# Patient Record
Sex: Female | Born: 1955 | Race: White | Hispanic: No | Marital: Married | Smoking: Former smoker
Health system: Southern US, Community
[De-identification: ages and names within clinical notes are randomized; demographics above are authoritative.]

## PROBLEM LIST (undated history)

## (undated) DIAGNOSIS — Z720 Tobacco use: Secondary | ICD-10-CM

## (undated) DIAGNOSIS — Z888 Allergy status to other drugs, medicaments and biological substances status: Secondary | ICD-10-CM

## (undated) DIAGNOSIS — I509 Heart failure, unspecified: Secondary | ICD-10-CM

## (undated) DIAGNOSIS — I209 Angina pectoris, unspecified: Secondary | ICD-10-CM

## (undated) DIAGNOSIS — R0600 Dyspnea, unspecified: Secondary | ICD-10-CM

## (undated) DIAGNOSIS — E785 Hyperlipidemia, unspecified: Secondary | ICD-10-CM

## (undated) DIAGNOSIS — J449 Chronic obstructive pulmonary disease, unspecified: Secondary | ICD-10-CM

## (undated) HISTORY — DX: Allergy status to other drugs, medicaments and biological substances: Z88.8

## (undated) HISTORY — PX: KNEE ARTHROSCOPY: SUR90

## (undated) HISTORY — DX: Tobacco use: Z72.0

## (undated) HISTORY — DX: Morbid (severe) obesity due to excess calories: E66.01

## (undated) HISTORY — DX: Heart failure, unspecified: I50.9

## (undated) HISTORY — DX: Hyperlipidemia, unspecified: E78.5

## (undated) HISTORY — PX: TUBAL LIGATION: SHX77

---

## 1999-11-15 ENCOUNTER — Other Ambulatory Visit: Admission: RE | Admit: 1999-11-15 | Discharge: 1999-11-15 | Payer: Self-pay | Admitting: Family Medicine

## 2000-02-17 ENCOUNTER — Encounter: Payer: Self-pay | Admitting: Family Medicine

## 2000-02-17 ENCOUNTER — Ambulatory Visit (HOSPITAL_COMMUNITY): Admission: RE | Admit: 2000-02-17 | Discharge: 2000-02-17 | Payer: Self-pay | Admitting: Family Medicine

## 2003-02-17 ENCOUNTER — Ambulatory Visit (HOSPITAL_COMMUNITY): Admission: RE | Admit: 2003-02-17 | Discharge: 2003-02-17 | Payer: Self-pay | Admitting: Family Medicine

## 2003-04-28 ENCOUNTER — Other Ambulatory Visit: Admission: RE | Admit: 2003-04-28 | Discharge: 2003-04-28 | Payer: Self-pay | Admitting: Family Medicine

## 2003-05-06 ENCOUNTER — Ambulatory Visit: Admission: RE | Admit: 2003-05-06 | Discharge: 2003-05-06 | Payer: Self-pay | Admitting: Family Medicine

## 2005-12-08 ENCOUNTER — Other Ambulatory Visit: Admission: RE | Admit: 2005-12-08 | Discharge: 2005-12-08 | Payer: Self-pay | Admitting: Obstetrics and Gynecology

## 2006-09-11 ENCOUNTER — Encounter: Admission: RE | Admit: 2006-09-11 | Discharge: 2006-09-11 | Payer: Self-pay | Admitting: Orthopedic Surgery

## 2006-09-14 ENCOUNTER — Ambulatory Visit (HOSPITAL_BASED_OUTPATIENT_CLINIC_OR_DEPARTMENT_OTHER): Admission: RE | Admit: 2006-09-14 | Discharge: 2006-09-14 | Payer: Self-pay | Admitting: Orthopedic Surgery

## 2009-05-05 ENCOUNTER — Other Ambulatory Visit: Admission: RE | Admit: 2009-05-05 | Discharge: 2009-05-05 | Payer: Self-pay | Admitting: Family Medicine

## 2010-05-06 ENCOUNTER — Ambulatory Visit
Admission: RE | Admit: 2010-05-06 | Discharge: 2010-05-06 | Payer: Self-pay | Source: Home / Self Care | Attending: Internal Medicine | Admitting: Internal Medicine

## 2010-05-06 DIAGNOSIS — F17200 Nicotine dependence, unspecified, uncomplicated: Secondary | ICD-10-CM | POA: Insufficient documentation

## 2010-05-06 DIAGNOSIS — J439 Emphysema, unspecified: Secondary | ICD-10-CM | POA: Insufficient documentation

## 2010-05-09 DIAGNOSIS — J45909 Unspecified asthma, uncomplicated: Secondary | ICD-10-CM | POA: Insufficient documentation

## 2010-05-19 NOTE — Assessment & Plan Note (Signed)
Summary: CONSULT ASTHMA//SH   Primary Provider/Referring Provider:  [name]  CC:  Pulmonary Consult-asthma;Dr [name].Kelly Hale  History of Present Illness: 2010-06-04- 54yoF referred by DR Cliffton Asters for concern of asthma. Current smoker describes onset of asthma around [age] years old. Family hx of asthma- mother and MGF were patients of mine. Hosp with asthma/ croup tent age 20- blamed then on exposure to feather pillow. . Did better through high school. Now blames dust at work for sinnus congestion, sneeze. Daily phlegm, somedays some wheeze. Sensitive to weather changes. Doesn't think of herslf as an allergic person. Prefers Advair over Milligan recently tried. 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 Additional stress a factor since husband lost job. Denies GERD.  Had pneumovax but chooses not to have flu vax.  Aspirin causes short of breath and wheeze- never dx'd with nasal polyps.  Exposed to Uncle w/ TB in 1970's but her PPD was negative.   Preventive Screening-Counseling & Management  Alcohol-Tobacco     Smoking Status: current     Smoking Cessation Counseling: yes     Smoke Cessation Stage: precontemplative     Packs/Day: 1ppd     Year Started: 1988     Pack years: 30     Tobacco Counseling: to quit use of tobacco products  Current Medications (verified): 1)  Advair Diskus 250-50 Mcg/dose Aepb (Fluticasone-Salmeterol) .Kelly Hale.. 1 Puff  Two Times A Day and Rinse Mouth Well 2)  Proair Hfa 108 (90 Base) Mcg/act Aers (Albuterol Sulfate) .... 2 Puffs Four Times A Day As Needed  Allergies (verified): 1)  ! 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  Risk Factors: Smoking Status: current (04-Jun-2010) Packs/Day: 1ppd (2010-06-04)  Past Medical History: Asthma Hyperlipidemia Aspirin allergy- no polyps  Past Surgical History: C-section Tubal ligation right knee arthroscopy  Family History: Father-deceased MVA Mother-HTN,hypercholesterol, asthma, heart valve replaced Paternal Grand Father: DM Maternal Grandfather: HTN,Hypercholoestrol,CVA Maternal Grandmother: Hypertension, Alzheimers MAternal Uncle: laryngeal cancer  Social History: Smoker 1ppd x 30 years Lives with Husband:Kelly Hale- [employer] and Receiving Air cabin crew storeSmoking Status:  current Packs/Day:  1ppd Pack years:  30  Review of Systems      See HPI       The patient complains of productive cough, sore throat, nasal congestion/difficulty breathing through nose, and anxiety.  The patient denies shortness of breath with activity, shortness of breath at rest, non-productive cough, coughing up blood, chest pain, irregular heartbeats, acid heartburn, indigestion, loss of appetite, weight change, abdominal pain, difficulty swallowing, tooth/dental problems, headaches, sneezing, itching, ear ache, depression, hand/feet swelling, joint stiffness or pain, rash, change in color of mucus, and fever.    Vital Signs:  Patient profile:   55 year old female Height:      67 inches Weight:      245 pounds BMI:     38.51 O2 Sat:      92 % on Room air Pulse rate:   92 / minute BP sitting:   124 / 78  (left arm) Cuff size:   large  Vitals Entered By: Reynaldo Minium CMA (Jun 04, 2010 2:55 PM)  O2 Flow:  Room air CC: Pulmonary Consult-asthma;Dr [name].  Physical Exam  Additional Exam:  General: A/Ox3; pleasant and cooperative, NAD, overweight SKIN: no rash, lesions NODES: no lymphadenopathy HEENT: [place]/AT, EOM- WNL, Conjuctivae- clear, PERRLA,  TM-WNL, Nose- clear, Throat- clear and wnl, Mallampati  II NECK: Supple w/ fair ROM, JVD- none, normal carotid impulses w/o bruits Thyroid- normal to palpation CHEST: light bilateral wheeze, unlabored, no cough HEART: RRR, no m/g/r heard ABDOMEN: Soft and nl; nml bowel sounds; no organomegaly or masses noted ZOX:WRUE, nl pulses, no edema, cyanosis or clubbing NEURO: Grossly intact to observation      Impression & Recommendations:  Problem # 1:  BRONCHITIS (ICD-490) This may better be described as chronic bronchitis with reactive airway triggering due to cold air, dust, weather and season changes. She did apparently have a recognizable allergic component at one time, but not clear how important that is any longer. We will look for PFTdone at ?[facility] and see if there is an old GCDA chart in warehouse. She prefers ADvair over Nan[place]uli and has returned to it, with mnedication discussion done.  We will update CXR and try sample Spiriva for effect.  Her updated medication list for this problem includes:    Advair Diskus 250-50 Mcg/dose Aepb (Fluticasone-salmeterol) .Kelly Hale... 1 puff  two times a day and rinse mouth well    Proair Hfa 108 (90 Base) Mcg/act Aers (Albuterol sulfate) .Kelly Hale... 2 puffs four times a day as needed  Problem # 2:  TOBACCO USER (ICD-305.1) Extra time was spent on this topic with her. She feels stressed about husband's job and not ready to make cessation effort now. Hopefully we can get some traction going forward.   Medications Added to Medication List This Visit: 1)  Advair Diskus 250-50 Mcg/dose Aepb (Fluticasone-salmeterol) .Kelly Hale.. 1 puff  two times a day and rinse mouth well 2)  Proair Hfa 108 (90 Base) Mcg/act Aers (Albuterol sulfate) .... 2 puffs four times a day as needed  Other Orders: Est. Patient Level III (45409) T-2 View CXR (71020TC) Consultation Level III (81191)  Patient Instructions: 1)  Please schedule a follow-up appointment in 1 month. 2)  OK to continue Advair  250/50, 1 puff and rinse, twice dialy 3)  Ok to use [employer] as a rescue inhaler up to 4 ties daily as needed 4)  Try sample Spiriva 1 daily. See if it gradually seems to help wheeze or shortness of breath 5)  Please - it is time to try again getting ff those cigarettes before they do you in.  6)  A chest x-ray has been recommended.  Your imaging study may require preauthorization.  7)  .........................................................................................Kelly Hale 8)  DG CHEST 2 VIEW - [id number] 9)    10)  Clinical Data: Smoking history, history of asthma 11)    12)  CHEST - 2 VIEW 13)    14)  Comparison: None. 15)    16)  Findings: No active infiltrate or effusion is seen.  Mild 17)  peribronchial thickening is noted consistent with bronchitis. 18)  Mediastinal contours appear grossly normal.  The heart is within 19)  normal limits in size.  There is a thoracic scoliosis present with 20)  some degenerative change. 21)    22)  IMPRESSION: 23)    24)  1.  No active lung disease. 25)  2.  Peribronchial thickening consistent with bronchitis. 26)  3.  Thoracic scoliosis. 27)    28)  Read By:  Juline Patch,  M.D.     29)  Released By:  Gery Pray,  Colon Flattery,  M.D. 30)  cc Dr Cliffton Asters

## 2010-06-08 ENCOUNTER — Ambulatory Visit: Payer: Self-pay | Admitting: Internal Medicine

## 2010-08-24 NOTE — Op Note (Signed)
Kelly Hale, Kelly Hale                ACCOUNT NO.:  [id number]   MEDICAL RECORD NO.:  [id number]          PATIENT TYPE:  AMB   LOCATION:  DSC                          FACILITY:  [facility]   PHYSICIAN:  Rodney A. Mortenson, M.D.DATE OF BIRTH:  1955/10/26   DATE OF PROCEDURE:  09/14/2006  DATE OF DISCHARGE:                               OPERATIVE REPORT   JUSTIFICATION:  A 55 year old female referred here by the courtesy of  Dr. [name] regarding a 6-7-week history of right knee pain, no  injury.  She does twisting type of motions.  She has pain along the  medial joint line.  Getting out of the car is quite uncomfortable.  There is acute tenderness along the medial joint line.  An MRI shows A  tear of the posterior horn at the meniscal root.  No other significant  pathology seen in the knee, otherwise some fairly mild tricompartmental  chondromalacia.  Because of persistent pain and discomfort, it was felt  that arthroscopic evaluation was indicated and necessary.  Complications  discussed preoperatively.  Questions answered and encouraged.   JUSTIFICATION FOR OUTPATIENT SURGERY:  Minimal morbidity.   PREOPERATIVE DIAGNOSES:  1. Tear of posterior horn attachment, medial meniscus of the right      knee.   POSTOPERATIVE DIAGNOSES:  1. 1.  Tear of posterior horn attachment, medial meniscus of the right      knee.  2. 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   OPERATION:  1. Arthroscopy.  2. Debride posterior horn medial meniscus, right knee.  3. Chondroplasty of medial femoral condyle.   SURGEON:  Lenard Galloway. Chaney Malling, M.D.   ANESTHESIA:  General.   PATHOLOGY:  With the arthroscope in the knee, a very careful examination  of the knee was undertaken.  The patellofemoral joint appeared fairly  normal.  In the lateral compartment there was a normal articular  cartilage over the lateral tibial plateau and the lateral femoral  condyle.  Entire entire circumference of the lateral meniscus was  normal.  The anterior cruciate ligament was normal.  In the medial  compartment the anterior 7/8 of the medial meniscus was absolutely  normal.  However, at the posterior root attachment there was marked  fraying and tearing of the meniscus and this could be displaced with a  probe.  The femoral condyle of this area was frayed and torn and  cartilage damaged, rated at least grade 2 cartilage damage in this area.   PROCEDURE:  The patient placed on the operating table in a supine  position with a pneumatic tourniquet about the right thigh.  The right  leg was placed in a leg holder and the entire right lower extremity was  prepped with DuraPrep and draped out in the usual manner.  An infusion  cannula was placed in the superior medial pouch and the knee distended  with saline.  Anteromedial and anterolateral portals were made and a  midline portal was made for better access.  The arthroscope was  introduced.  The only significant  pathology was seen in the medial  compartment.  Through both medial and lateral and the midline portals a  series of baskets were inserted and the posterior root attachment was  debrided aggressively.  This was followed by the intra-articular shaver.  All the frayed and torn portion of cartilage was removed.  The remaining  7/8 of the meniscus appeared normal.  Using a full-radius dissector,  chondroplasty was done over the damaged area of the medial femoral  condyle.  Once this was completed to my satisfaction, the knee was  filled with Marcaine and a large bulky pressure dressing applied.  The  patient then returned to the recovery room in nice condition.  Technically this procedure went extremely well.   FOLLOW-UP CARE:  1. To my office next week.  2. Remove dressing on Saturday.  3. Percocet for pain.           ______________________________  Lenard Galloway. Chaney Malling, M.D.     RAM/MEDQ   D:  09/14/2006  T:  09/14/2006  Job:  [id number]

## 2011-03-11 ENCOUNTER — Encounter: Payer: Self-pay | Admitting: Internal Medicine

## 2011-03-14 ENCOUNTER — Encounter: Payer: Self-pay | Admitting: Internal Medicine

## 2011-03-14 ENCOUNTER — Ambulatory Visit (INDEPENDENT_AMBULATORY_CARE_PROVIDER_SITE_OTHER): Payer: 59 | Admitting: Internal Medicine

## 2011-03-14 VITALS — BP 130/82 | HR 80 | Ht 67.0 in | Wt 258.8 lb

## 2011-03-14 DIAGNOSIS — R05 Cough: Secondary | ICD-10-CM

## 2011-03-14 DIAGNOSIS — F17200 Nicotine dependence, unspecified, uncomplicated: Secondary | ICD-10-CM

## 2011-03-14 DIAGNOSIS — J40 Bronchitis, not specified as acute or chronic: Secondary | ICD-10-CM

## 2011-03-14 DIAGNOSIS — Z23 Encounter for immunization: Secondary | ICD-10-CM

## 2011-03-14 NOTE — Progress Notes (Signed)
12//3/12- [name] smoker followed for chronic bronchitis, tobacco use LOV- 05/06/10 Did well through the summer. Dr. Marvel Plan increased Advair to 500/50 without benefit. As cooler weather came in, she noticed increased wheeze, cough productive of white or trace yellow sputum. 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 She rates okay at night except that wheeze delays her sleep onset. Still smoking one pack per day and resists admitting that it is causing any problem. She says her husband is similar but doesn't smoke. She chokes easily with liquids. CXR- 05/06/10- bronchitis  ROS- see HPI Constitutional:   No-   weight loss, night sweats, fevers, chills, fatigue, lassitude. HEENT:   No-  headaches, difficulty swallowing, tooth/dental problems, sore throat,       No-  sneezing, itching, ear ache, nasal congestion, post nasal drip,  CV:  No-   chest pain, orthopnea, PND, swelling in lower extremities, anasarca,                                  dizziness, palpitations Resp: + shortness of breath with exertion             No-   productive cough,  No non-productive cough,  No- coughing up of blood.              No-   change in color of mucus.  No- wheezing.   Skin: No-   rash or lesions. GI:  No-   heartburn, indigestion, abdominal pain, nausea, vomiting, diarrhea,                 change in bowel habits, loss of appetite GU: No-   dysuria, change in color of urine, no urgency or frequency.  No- flank pain. MS:  No-   joint pain or swelling.  No- decreased range of motion.  No- back pain. Neuro-     nothing unusual Psych:  No- change in mood or affect. No depression or anxiety.  No memory loss.  Obj General- Alert, Oriented, Affect-appropriate, Distress- none acute, overweight Skin- rash-none, lesions- none, excoriation- none Lymphadenopathy- none Head- atraumatic            Eyes- Gross vision intact, PERRLA, conjunctivae clear secretions  Ears- Hearing, canals-normal            Nose- Clear, no-Septal dev, mucus, polyps, erosion, perforation             Throat- Mallampati III , mucosa red , drainage- none, tonsils- atrophic Neck- flexible , trachea midline, no stridor , thyroid nl, carotid no bruit Chest - symmetrical excursion , unlabored           Heart/CV- RRR , no murmur , no gallop  , no rub, nl s1 s2                           - JVD- none , edema- none, stasis changes- none, varices- none           Lung- Crackles, wheeze- none, cough- none , dullness-none, rub- none           Chest wall-  Abd- tender-no, distended-no, bowel sounds-present, HSM- no Br/ Gen/ Rectal- Not done, not indicated Extrem- cyanosis- none, clubbing, none, atrophy- none, strength- nl Neuro- grossly intact to observation

## 2011-03-14 NOTE — Patient Instructions (Signed)
Sample Symbicort 160/5    2 puffs and rinse mouth twice daily     Try this instead of Advair  Order- modified barium swallow with speech therapy-       Cough  Order PFT  Please stop smoking. It can't be good for your breathing and it just costs money. The nicotine patches may help;

## 2011-03-16 NOTE — Assessment & Plan Note (Addendum)
Chronic bronchitis with some exacerbation related to fall weather. Smoking cessation is obviously key. Plan- change Advair to Symbicort - Clarify whether she has had Flu or Pneumovax. -PFT -Modified barium swallow with speech therapy to evaluate choking with liquids. Recurrent aspiration would contribute to her bronchitis.

## 2011-03-16 NOTE — Assessment & Plan Note (Signed)
She was not receptive to my efforts to discuss smoking cessation opportunities.

## 2011-03-17 ENCOUNTER — Other Ambulatory Visit (HOSPITAL_COMMUNITY): Payer: Self-pay | Admitting: Internal Medicine

## 2011-03-22 ENCOUNTER — Ambulatory Visit (INDEPENDENT_AMBULATORY_CARE_PROVIDER_SITE_OTHER): Payer: 59 | Admitting: Internal Medicine

## 2011-03-22 DIAGNOSIS — R05 Cough: Secondary | ICD-10-CM

## 2011-03-22 LAB — PULMONARY FUNCTION TEST

## 2011-03-22 NOTE — Progress Notes (Signed)
PFT done today. 

## 2011-03-23 ENCOUNTER — Ambulatory Visit (HOSPITAL_COMMUNITY)
Admission: RE | Admit: 2011-03-23 | Discharge: 2011-03-23 | Disposition: A | Discharge status: Home / Self Care | Payer: 59 | Source: Ambulatory Visit | Attending: Internal Medicine | Admitting: Internal Medicine

## 2011-03-23 DIAGNOSIS — R1310 Dysphagia, unspecified: Secondary | ICD-10-CM | POA: Insufficient documentation

## 2011-03-23 DIAGNOSIS — R05 Cough: Secondary | ICD-10-CM

## 2011-03-23 NOTE — Procedures (Signed)
Celia B. Bueche, MSP, CCC-SLP [phone] 

## 2011-03-24 NOTE — Procedures (Signed)
Modified Barium Swallow Procedure Note Patient Details  Name: Kelly Hale MRN: [id number] Date of Birth: [birth year]  Today's Date: 03/24/2011   Past Medical History:  Past Medical History  Diagnosis Date  . Asthma   . Hyperlipidemia   . Aspirin allergy     non polyps   Past Surgical History:  Past Surgical History  Procedure Date  . Cesarean section   . Tubal ligation   . Knee arthroscopy     right   HPI:   Pt referred for outpatient MBS due to history of choking on liquids.  Recommendation/Prognosis   Continue regular diet with thin liquids, no straws.  Pt was given oropharyngeal exercises to complete independently, to increase strength of oropharyngeal musculature and improve swallow safety.  Pt was encouraged to maintain contact with MD regarding this issue, as outpatient dysphagia therapy may be beneficial in the future.  Additionally, pt reported intermittent pressure and pain in the esophageal area.  Pt was encouraged to begin meals with a warm beverage or soup.   Recommended continued attention to this, as esophageal workup may be needed in the future.      SLP Assessment/Plan  Pt exhibits tolerance of puree, solid, and pill without difficulty, penetration or aspiration.  Pt exhibits trace penetration of thin liquid via cup and straw, which is decreased by the use of chin tuck position.  One episode of aspiration was seen on thin liquids, which resulted in immediate and effective reflexive cough.  Pt given exercises to complete independently, but was encouraged to contact MD for referral for outpatient therapy if  this difficulty continues.  Also encouraged pt to contact MD if esophageal issues persist.  General:  Date of Onset: 03/23/11 Other Pertinent Information: No prior MBS.  Pt reports intermittent history of choking on liquids. Type of Study: Initial MBS Diet Prior to this Study: Regular;Thin liquids Temperature Spikes Noted: No Respiratory Status: Room  air History of Intubation: No Behavior/Cognition: Alert;Cooperative;Pleasant mood Oral Cavity - Dentition: Adequate natural dentition Oral Motor / Sensory Function: Within functional limits Vision: Functional for self-feeding Patient Positioning: Upright in chair Baseline Vocal Quality: Normal Volitional Cough: Strong Volitional Swallow: Able to elicit Anatomy: Within functional limits Pharyngeal Secretions: Not observed secondary MBS Ice chips: Not tested  Celia B. Bueche, MSP, CCC-SLP [phone]

## 2011-06-17 ENCOUNTER — Encounter: Payer: Self-pay | Admitting: Internal Medicine

## 2011-06-17 ENCOUNTER — Ambulatory Visit (INDEPENDENT_AMBULATORY_CARE_PROVIDER_SITE_OTHER): Payer: 59 | Admitting: Internal Medicine

## 2011-06-17 VITALS — BP 120/84 | HR 86 | Ht 67.0 in | Wt 257.6 lb

## 2011-06-17 DIAGNOSIS — M792 Neuralgia and neuritis, unspecified: Secondary | ICD-10-CM

## 2011-06-17 DIAGNOSIS — F17200 Nicotine dependence, unspecified, uncomplicated: Secondary | ICD-10-CM

## 2011-06-17 DIAGNOSIS — J40 Bronchitis, not specified as acute or chronic: Secondary | ICD-10-CM

## 2011-06-17 DIAGNOSIS — J449 Chronic obstructive pulmonary disease, unspecified: Secondary | ICD-10-CM

## 2011-06-17 DIAGNOSIS — M5412 Radiculopathy, cervical region: Secondary | ICD-10-CM

## 2011-06-17 MED ORDER — ARFORMOTEROL TARTRATE 15 MCG/2ML IN NEBU: 15.0000 ug | INHALATION_SOLUTION | Freq: Two times a day (BID) | RESPIRATORY_TRACT | Status: DC

## 2011-06-17 MED ORDER — ALBUTEROL SULFATE HFA 108 (90 BASE) MCG/ACT IN AERS: 2.0000 | INHALATION_SPRAY | Freq: Four times a day (QID) | RESPIRATORY_TRACT | Status: DC | PRN

## 2011-06-17 NOTE — Patient Instructions (Signed)
Kelly Hale now  Skip your evening dose of Advair this time, while we look at how the nebulizer treatment works for you.  You already have moderately severe COPD- It is really important that you stop smoking now ! The patches and electronic cigarettes are better than smoking cigarettes.  Ask Dr Cliffton Asters for evaluation of the right arm pain. I think you have pinched nerves in your neck.

## 2011-06-17 NOTE — Progress Notes (Signed)
12//3/12- [name] smoker followed for chronic bronchitis, tobacco use LOV- 05/06/10 Did well through the summer. Dr. Marvel Plan increased Advair to 500/50 without benefit. As cooler weather came in, she noticed increased wheeze, cough productive of white or trace yellow sputum. 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 She rates okay at night except that wheeze delays her sleep onset. Still smoking one pack per day and resists admitting that it is causing any problem. She says her husband is similar but doesn't smoke. She chokes easily with liquids. CXR- 05/06/10- bronchitis  06/17/11- [name] smoker followed for chronic bronchitis, tobacco use CXR 05/06/10 reviewed w/ her-  1. No active lung disease.  2. Peribronchial thickening consistent with bronchitis.  3. Thoracic scoliosis.  Provider: Darrelyn Hillock  She felt okay through this winter with no significant respiratory infection. Dislikes cold weather. Does not expect a pollen problems the spring. Continues Advair 500 and uses her rescue inhaler once every 3 days. Little cough or drainage usually. Pain in right arm and right side of neck hurts enough to keep her awake. 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 We had given samples Spiriva with no effect. Continues to smoke against advice. PFT: 03/22/2011-FEV1 1.49/58%, FEV1/FVC 0.62, insignificant response to bronchodilator, RV 163% indicating air trapping, DLCO 72%. Moderate obstructive airways disease without response to bronchodilator. Air-trapping.  ROS- see HPI Constitutional:   No-   weight loss, night sweats, fevers, chills, fatigue, lassitude. HEENT:   No-  headaches, difficulty swallowing, tooth/dental problems, sore throat,       No-  sneezing, itching, ear ache, nasal congestion, post nasal drip,  CV:  No-   chest pain, orthopnea, PND, swelling in lower extremities, anasarca,  dizziness, palpitations Resp: + shortness of breath with  exertion             No-   productive cough,  No non-productive cough,  No- coughing up of blood.              No-   change in color of mucus.  No- wheezing.   Skin: No-   rash or lesions. GI:  No-   heartburn, indigestion, abdominal pain, nausea, vomiting, GU: No-   dysuria,  MS: +  joint pain or swelling.  No- decreased range of motion.  No- back pain. Neuro-     nothing unusual Psych:  No- change in mood or affect. No depression or anxiety.  No memory loss.  Obj General- Alert, Oriented, Affect-appropriate, Distress- none acute, overweight Skin- rash-none, lesions- none, excoriation- none Lymphadenopathy- none Head- atraumatic            Eyes- Gross vision intact, PERRLA, conjunctivae clear secretions            Ears- Hearing, canals-normal            Nose- Clear, no-Septal dev, mucus, polyps, erosion, perforation             Throat- Mallampati III , mucosa red , drainage- none, tonsils- atrophic Neck- flexible , trachea midline, no stridor , thyroid nl, carotid no bruit Chest - symmetrical excursion , unlabored           Heart/CV- RRR , no murmur , no gallop  , no rub, nl s1 s2                           - JVD- none , edema- none,  stasis changes- none, varices- none           Lung-Diminished w/ coarse breath sounds, wheeze- none, cough- none , dullness-none, rub- none           Chest wall-  Abd-  Br/ Gen/ Rectal- Not done, not indicated Extrem- cyanosis- none, clubbing, none, atrophy- none, strength- nl Neuro- grossly intact to observation

## 2011-06-19 DIAGNOSIS — M792 Neuralgia and neuritis, unspecified: Secondary | ICD-10-CM | POA: Insufficient documentation

## 2011-06-19 NOTE — Assessment & Plan Note (Signed)
Chronic bronchitis pattern in context of moderately severe COPD with poor response to bronchodilator on PFTs. We discussed this as I reviewed the PFTs. Weight loss and smoking cessation of the most important things she could do. Plan-try Brovana nebulizer treatment here

## 2011-06-19 NOTE — Assessment & Plan Note (Signed)
I tried to engage her in discussion of smoking cessation approaches. She is not motivated.

## 2011-06-19 NOTE — Assessment & Plan Note (Signed)
She describes pain in the right side of neck and into the right arm with numbness in the thumb and first 2 fingers of the right hand. 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 Chest x-ray does not show a Pancoast tumor. She is to speak with her primary physician about it, anticipating probable referral.

## 2011-10-17 ENCOUNTER — Ambulatory Visit: Payer: 59 | Admitting: Internal Medicine

## 2012-03-02 ENCOUNTER — Other Ambulatory Visit: Payer: Self-pay | Admitting: Physician Assistant

## 2012-03-02 ENCOUNTER — Other Ambulatory Visit (HOSPITAL_COMMUNITY)
Admission: RE | Admit: 2012-03-02 | Discharge: 2012-03-02 | Disposition: A | Discharge status: Home / Self Care | Payer: 59 | Source: Ambulatory Visit | Attending: Family Medicine | Admitting: Family Medicine

## 2012-03-02 DIAGNOSIS — Z0000 Encounter for general adult medical examination without abnormal findings: Secondary | ICD-10-CM | POA: Insufficient documentation

## 2012-10-11 ENCOUNTER — Ambulatory Visit: Payer: Self-pay | Admitting: Otolaryngology

## 2012-10-18 ENCOUNTER — Ambulatory Visit: Payer: 59 | Admitting: Internal Medicine

## 2012-11-22 ENCOUNTER — Ambulatory Visit: Payer: 59 | Admitting: Internal Medicine

## 2013-08-12 ENCOUNTER — Other Ambulatory Visit: Payer: Self-pay | Admitting: Family Medicine

## 2013-08-12 ENCOUNTER — Ambulatory Visit
Admission: RE | Admit: 2013-08-12 | Discharge: 2013-08-12 | Disposition: A | Discharge status: Home / Self Care | Payer: 59 | Source: Ambulatory Visit | Attending: Family Medicine | Admitting: Family Medicine

## 2013-08-12 DIAGNOSIS — J441 Chronic obstructive pulmonary disease with (acute) exacerbation: Secondary | ICD-10-CM

## 2013-08-22 ENCOUNTER — Encounter: Payer: Self-pay | Admitting: Internal Medicine

## 2013-08-22 ENCOUNTER — Ambulatory Visit (INDEPENDENT_AMBULATORY_CARE_PROVIDER_SITE_OTHER): Payer: 59 | Admitting: Internal Medicine

## 2013-08-22 VITALS — BP 144/84 | HR 100 | Ht 67.0 in | Wt 251.1 lb

## 2013-08-22 DIAGNOSIS — J449 Chronic obstructive pulmonary disease, unspecified: Secondary | ICD-10-CM

## 2013-08-22 DIAGNOSIS — F17200 Nicotine dependence, unspecified, uncomplicated: Secondary | ICD-10-CM

## 2013-08-22 DIAGNOSIS — IMO0001 Reserved for inherently not codable concepts without codable children: Secondary | ICD-10-CM

## 2013-08-22 MED ORDER — ACLIDINIUM BROMIDE 400 MCG/ACT IN AEPB: 1.0000 | INHALATION_SPRAY | Freq: Two times a day (BID) | RESPIRATORY_TRACT | Status: DC

## 2013-08-22 NOTE — Progress Notes (Signed)
[name], MD at 06/17/2011  4:25 PM      Status: Signed            12//3/12- 50 yoF smoker followed for chronic bronchitis, tobacco use LOV- 05/06/10 Did well through the summer. Dr. Larena Glassman increased Advair to 500/50 without benefit. As cooler weather came in, she noticed increased wheeze, cough productive of white or trace yellow sputum. 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 She rates okay at night except that wheeze delays her sleep onset. Still smoking one pack per day and resists admitting that it is causing any problem. She says her husband is similar but doesn't smoke. She chokes easily with liquids. CXR- 05/06/10- bronchitis  06/17/11- [name] smoker followed for chronic bronchitis, tobacco use CXR 05/06/10 reviewed w/ her-   1. No active lung disease.   2. Peribronchial thickening consistent with bronchitis.   3. Thoracic scoliosis.   Provider: Lorina Rabon    She felt okay through this winter with no significant respiratory infection. Dislikes cold weather. Does not expect a pollen problems the spring. Continues Advair 500 and uses her rescue inhaler once every 3 days. Little cough or drainage usually. Pain in right arm and right side of neck hurts enough to keep her awake. 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 We had given samples Spiriva with no effect. Continues to smoke against advice. PFT: 03/22/2011-FEV1 1.49/58%, FEV1/FVC 0.62, insignificant response to bronchodilator, RV 163% indicating air trapping, DLCO 72%. Moderate obstructive airways disease without response to bronchodilator. Air-trapping.  Assessment:   COPD with bronchitis - [name], MD at 06/19/2011  8:45 PM      Status: Written Related Problem: BRONCHITIS    Chronic bronchitis pattern in context of moderately severe COPD with poor response to bronchodilator on PFTs. We discussed this as I reviewed the PFTs. Weight loss and smoking  cessation of the most important things she could do. Plan-try Brovana nebulizer treatment here       Neuropathic pain, arm - [name], MD at 06/19/2011  8:53 PM      Status: Written Related Problem: Neuropathic pain, arm    She describes pain in the right side of neck and into the right arm with numbness in the thumb and first 2 fingers of the right hand. 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 Chest x-ray does not show a Pancoast tumor. She is to speak with her primary physician about it, anticipating probable referral.  TOBACCO USER - [name], MD at 06/19/2011  8:53 PM      Status: Written Related Problem: TOBACCO USER    I tried to engage her in discussion of smoking cessation approaches. She is not motivated.    ----------------------------------------------------------------------- 08/22/13- 57 yoF smoker (1ppd/ 30 pk yr) with COPD/bronchitis/asthma complicated by tobacco use [name] Cough was worse for 3 weeks but now back at baseline. Denies routine daily cough, but still some green sputum. Took Levaquin for 10 days, prednisone for 10 days ending 4 days ago. Baseline dyspnea on exertion without blood, swelling, chest pain, palpitation. Using [place]. Advair 500, nebulizer albuterol, rescue inhaler. 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 CXR 08/12/13 IMPRESSION:  No acute infiltrate or pulmonary edema. Thoracic dextroscoliosis  again noted. Central mild bronchitic changes.  Electronically Signed  By: [name]  M.D.  On: 08/12/2013 13:22   ROS-see HPI Constitutional:   No-   weight loss, night sweats, fevers, chills, fatigue, lassitude. HEENT:   No-  headaches, difficulty swallowing, tooth/dental problems, sore throat,       No-  sneezing, itching, ear ache, +nasal congestion, +post nasal drip,  CV:  No-   chest pain, orthopnea, PND, swelling in lower  extremities, anasarca,                                  dizziness, palpitations Resp: +shortness of breath with exertion or at rest.              +productive cough,  No non-productive cough,  No- coughing up of blood.              +change in color of mucus.  No- wheezing.   Skin: No-   rash or lesions. GI:  No-   heartburn, indigestion, abdominal pain, nausea, vomiting,  GU:  MS:  No-   joint pain or swelling.  Neuro-     nothing unusual Psych:  No- change in mood or affect. No depression or anxiety.  No memory loss.  OBJ- Physical Exam General- Alert, Oriented, Affect-appropriate, Distress- none acute, obese Skin- rash-none, lesions- none, excoriation- none Lymphadenopathy- none Head- atraumatic            Eyes- Gross vision intact, PERRLA, conjunctivae and secretions clear            Ears- Hearing, canals-normal            Nose- Clear, no-Septal dev, mucus, polyps, erosion, perforation             Throat- Mallampati III-IV , mucosa clear , drainage- none, tonsils- atrophic Neck- flexible , trachea midline, no stridor , thyroid nl, carotid no bruit Chest - symmetrical excursion , unlabored           Heart/CV- RRR , no murmur , no gallop  , no rub, nl s1 s2                           - JVD- none , edema- none, stasis changes- none, varices- none           Lung- + mid- zone crackles, wheeze- none, cough- none , dullness-none, rub- none           Chest wall-  Abd- tender-no, distended-no, bowel sounds-present, HSM- no Br/ Gen/ Rectal- Not done, not indicated Extrem- cyanosis- none, clubbing, none, atrophy- none, strength- nl, +scoliosis Neuro- grossly intact to observation

## 2013-08-22 NOTE — Patient Instructions (Signed)
Order- Schedule PFT   Dx COPD with bronchitis  Sample x 2 Tudorza   1 puff, twice daily  Continue Advair 1 puff then rinse mouth, twice every day  Use the nebulizer or rescue albuterol inhaler up to 4 times daily, IF NEEDED  Please try to get serious about stopping those cigarettes before they stop you !

## 2013-10-01 ENCOUNTER — Ambulatory Visit (INDEPENDENT_AMBULATORY_CARE_PROVIDER_SITE_OTHER): Payer: 59 | Admitting: Internal Medicine

## 2013-10-01 DIAGNOSIS — J449 Chronic obstructive pulmonary disease, unspecified: Secondary | ICD-10-CM

## 2013-10-01 LAB — PULMONARY FUNCTION TEST
DL/VA % pred: 119 %
DL/VA: 6.04 ml/min/mmHg/L
DLCO UNC % PRED: 99 %
DLCO unc: 26.77 ml/min/mmHg
FEF 25-75 POST: 0.83 L/s
FEF 25-75 PRE: 0.97 L/s
FEF2575-%CHANGE-POST: -14 %
FEF2575-%PRED-POST: 32 %
FEF2575-%Pred-Pre: 37 %
FEV1-%Change-Post: -1 %
FEV1-%PRED-POST: 52 %
FEV1-%Pred-Pre: 53 %
FEV1-POST: 1.48 L
FEV1-PRE: 1.5 L
FEV1FVC-%CHANGE-POST: -1 %
FEV1FVC-%PRED-PRE: 86 %
FEV6-%CHANGE-POST: 3 %
FEV6-%PRED-PRE: 59 %
FEV6-%Pred-Post: 62 %
FEV6-Post: 2.18 L
FEV6-Pre: 2.1 L
FEV6FVC-%PRED-POST: 103 %
FEV6FVC-%Pred-Pre: 103 %
FVC-%CHANGE-POST: 0 %
FVC-%PRED-POST: 60 %
FVC-%Pred-Pre: 60 %
FVC-Post: 2.18 L
FVC-Pre: 2.19 L
PRE FEV1/FVC RATIO: 69 %
PRE FEV6/FVC RATIO: 100 %
Post FEV1/FVC ratio: 68 %
Post FEV6/FVC ratio: 100 %
RV % PRED: 116 %
RV: 2.37 L
TLC % pred: 92 %
TLC: 4.94 L

## 2013-10-01 NOTE — Progress Notes (Signed)
PFT done today. 

## 2013-10-06 NOTE — Assessment & Plan Note (Signed)
Counseling reinforced 

## 2013-10-06 NOTE — Assessment & Plan Note (Signed)
Recent exacerbation mostly resolved. Plan-smoking cessation, schedule PFT, continue [place]

## 2013-11-22 ENCOUNTER — Telehealth: Payer: Self-pay | Admitting: Internal Medicine

## 2013-11-22 MED ORDER — ALBUTEROL SULFATE (2.5 MG/3ML) 0.083% IN NEBU: 2.5000 mg | INHALATION_SOLUTION | Freq: Four times a day (QID) | RESPIRATORY_TRACT | Status: DC | PRN

## 2013-11-22 NOTE — Telephone Encounter (Signed)
[name] aware that Rx has been approved to send by CY and have sent it to [facility]. Nothing more needed at this time.

## 2013-11-22 NOTE — Telephone Encounter (Signed)
Pt states that CY wanted her to get a nebulizer machine and neb meds at last OV; pt states she got a neb machine from a family member and just needs the medication.   CY please advise. Thanks.

## 2013-11-22 NOTE — Telephone Encounter (Signed)
Offer albuterol 0.083% neb solution, # 100, 1 neb every 6 hours if needed, refill prn

## 2013-12-19 ENCOUNTER — Ambulatory Visit (INDEPENDENT_AMBULATORY_CARE_PROVIDER_SITE_OTHER)
Admission: RE | Admit: 2013-12-19 | Discharge: 2013-12-19 | Disposition: A | Discharge status: Home / Self Care | Payer: 59 | Source: Ambulatory Visit | Attending: Internal Medicine | Admitting: Internal Medicine

## 2013-12-19 ENCOUNTER — Ambulatory Visit (INDEPENDENT_AMBULATORY_CARE_PROVIDER_SITE_OTHER): Payer: 59 | Admitting: Internal Medicine

## 2013-12-19 ENCOUNTER — Encounter: Payer: Self-pay | Admitting: Internal Medicine

## 2013-12-19 ENCOUNTER — Other Ambulatory Visit (INDEPENDENT_AMBULATORY_CARE_PROVIDER_SITE_OTHER): Payer: 59

## 2013-12-19 VITALS — BP 132/78 | Ht 67.0 in | Wt 270.8 lb

## 2013-12-19 DIAGNOSIS — J441 Chronic obstructive pulmonary disease with (acute) exacerbation: Secondary | ICD-10-CM

## 2013-12-19 DIAGNOSIS — J449 Chronic obstructive pulmonary disease, unspecified: Secondary | ICD-10-CM

## 2013-12-19 DIAGNOSIS — IMO0001 Reserved for inherently not codable concepts without codable children: Secondary | ICD-10-CM

## 2013-12-19 DIAGNOSIS — F17200 Nicotine dependence, unspecified, uncomplicated: Secondary | ICD-10-CM

## 2013-12-19 LAB — CBC WITH DIFFERENTIAL/PLATELET
BASOS PCT: 0.9 % (ref 0.0–3.0)
Basophils Absolute: 0.1 10*3/uL (ref 0.0–0.1)
EOS PCT: 1.7 % (ref 0.0–5.0)
Eosinophils Absolute: 0.2 10*3/uL (ref 0.0–0.7)
HCT: 55.3 % — ABNORMAL HIGH (ref 36.0–46.0)
HEMOGLOBIN: 17.1 g/dL — AB (ref 12.0–15.0)
LYMPHS PCT: 16.1 % (ref 12.0–46.0)
Lymphs Abs: 1.4 10*3/uL (ref 0.7–4.0)
MCHC: 30.9 g/dL (ref 30.0–36.0)
MCV: 93.7 fl (ref 78.0–100.0)
Monocytes Absolute: 0.5 10*3/uL (ref 0.1–1.0)
Monocytes Relative: 6.1 % (ref 3.0–12.0)
NEUTROS ABS: 6.6 10*3/uL (ref 1.4–7.7)
NEUTROS PCT: 75.2 % (ref 43.0–77.0)
Platelets: 275 10*3/uL (ref 150.0–400.0)
RBC: 5.9 Mil/uL — AB (ref 3.87–5.11)
RDW: 16.8 % — ABNORMAL HIGH (ref 11.5–15.5)
WBC: 8.8 10*3/uL (ref 4.0–10.5)

## 2013-12-19 LAB — D-DIMER, QUANTITATIVE: D-Dimer, Quant: 0.38 ug/mL-FEU (ref 0.00–0.48)

## 2013-12-19 MED ORDER — AMOXICILLIN-POT CLAVULANATE 875-125 MG PO TABS: 1.0000 | ORAL_TABLET | Freq: Two times a day (BID) | ORAL | Status: DC

## 2013-12-19 MED ORDER — METHYLPREDNISOLONE ACETATE 80 MG/ML IJ SUSP
80.0000 mg | Freq: Once | INTRAMUSCULAR | Status: AC
  Administered 2013-12-19: 80 mg via INTRAMUSCULAR

## 2013-12-19 MED ORDER — LEVALBUTEROL HCL 0.63 MG/3ML IN NEBU
0.6300 mg | INHALATION_SOLUTION | Freq: Once | RESPIRATORY_TRACT | Status: AC
  Administered 2013-12-19: 0.63 mg via RESPIRATORY_TRACT

## 2013-12-19 MED ORDER — FUROSEMIDE 20 MG PO TABS: ORAL_TABLET | ORAL | Status: DC

## 2013-12-19 NOTE — Assessment & Plan Note (Signed)
Acute exacerbation, possibly viral. I think this is mostly respiratory, with question of CHF component. Doubt acute cardiac event. Check for pneumonia or PE. Plan- CXR, CBC w diff, BMET, BNP. Lasix, augmentin, neb xopenex and depomedrol here. If we can stabilize her rapidly we may prevent eed for O2

## 2013-12-19 NOTE — Progress Notes (Signed)
Kelly Lever, MD at 06/17/2011  4:25 PM      Status: Signed            12//3/12- 58 yoF smoker followed for chronic bronchitis, tobacco use LOV- 05/06/10 Did well through the summer. Dr. Larena Glassman increased Advair to 500/50 without benefit. As cooler weather came in, she noticed increased wheeze, cough productive of white or trace yellow sputum. 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 She rates okay at night except that wheeze delays her sleep onset. Still smoking one pack per day and resists admitting that it is causing any problem. She says her husband is similar but doesn't smoke. She chokes easily with liquids. CXR- 05/06/10- bronchitis  06/17/11- [name] smoker followed for chronic bronchitis, tobacco use CXR 05/06/10 reviewed w/ her-   1. No active lung disease.   2. Peribronchial thickening consistent with bronchitis.   3. Thoracic scoliosis.   Provider: Lorina Hale    She felt okay through this winter with no significant respiratory infection. Dislikes cold weather. Does not expect a pollen problems the spring. Continues Advair 500 and uses her rescue inhaler once every 3 days. Little cough or drainage usually. Pain in right arm and right side of neck hurts enough to keep her awake. 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 We had given samples Spiriva with no effect. Continues to smoke against advice. PFT: 03/22/2011-FEV1 1.49/58%, FEV1/FVC 0.62, insignificant response to bronchodilator, RV 163% indicating air trapping, DLCO 72%. Moderate obstructive airways disease without response to bronchodilator. Air-trapping.  Assessment:   COPD with bronchitis - Kelly Lever, MD at 06/19/2011  8:45 PM      Status: Written Related Problem: BRONCHITIS    Chronic bronchitis pattern in context of moderately severe COPD with poor response to bronchodilator on PFTs. We discussed this as I reviewed the PFTs. Weight loss and smoking  cessation of the most important things she could do. Plan-try Brovana nebulizer treatment here       Neuropathic pain, arm - Kelly Lever, MD at 06/19/2011  8:53 PM      Status: Written Related Problem: Neuropathic pain, arm    She describes pain in the right side of neck and into the right arm with numbness in the thumb and first 2 fingers of the right hand. 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 Chest x-ray does not show a Pancoast tumor. She is to speak with her primary physician about it, anticipating probable referral.  TOBACCO USER - Kelly Lever, MD at 06/19/2011  8:53 PM      Status: Written Related Problem: TOBACCO USER    I tried to engage her in discussion of smoking cessation approaches. She is not motivated.    ----------------------------------------------------------------------- 08/22/13- 57 yoF smoker (1ppd/ 30 pk yr) with COPD/bronchitis/asthma complicated by tobacco use Mother is Kelly Hale Cough was worse for 3 weeks but now back at baseline. Denies routine daily cough, but still some green sputum. Took Levaquin for 10 days, prednisone for 10 days ending 4 days ago. Baseline dyspnea on exertion without blood, swelling, chest pain, palpitation. Using [place]. Advair 500, nebulizer albuterol, rescue inhaler. 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 CXR 08/12/13 IMPRESSION:  No acute infiltrate or pulmonary edema. Thoracic dextroscoliosis  again noted. Central mild bronchitic changes.  Electronically Signed  By: [name]  M.D.  On: 08/12/2013 13:22   12/19/13- 71 yoF smoker (1ppd/ 30 pk yr) with COPD/bronchitis/asthma complicated by tobacco use Mother is Kelly Hale FOLLOWS FOR:sob,wheeze x 1 wk.,bilat. ankle and leg edema,cough-clear,no fcs,midchest tightness and pain She feels like asthma, coughing clear mucus, persistent midsternal soreness- not radiating or  pleuritic, feet swelling a lot.  On arrival here sat mid-70% on room air walking. 90% on O2 2l at rest  ROS-see HPI Constitutional:   No-   weight loss, night sweats, fevers, chills, fatigue, lassitude. HEENT:   No-  headaches, difficulty swallowing, tooth/dental problems, sore throat,       No-  sneezing, itching, ear ache, +nasal congestion, +post nasal drip,  CV:  N=chest pain, orthopnea, PND, +swelling in lower extremities, anasarca,                                  dizziness, palpitations Resp: +shortness of breath with exertion or at rest.              +productive cough,  No non-productive cough,  No- coughing up of blood.              +change in color of mucus.  No- wheezing.   Skin: No-   rash or lesions. GI:  No-   heartburn, indigestion, abdominal pain, nausea, vomiting,  GU:  MS:  No-   joint pain or swelling.  Neuro-     nothing unusual Psych:  No- change in mood or affect. No depression or anxiety.  No memory loss.  OBJ- Physical Exam On our O2 2l General- Alert, Oriented, Affect-appropriate, Distress- none acute/ calm, obese Skin- rash-none, lesions- none, excoriation- none Lymphadenopathy- none Head- atraumatic            Eyes- Gross vision intact, PERRLA, conjunctivae and secretions clear            Ears- Hearing, canals-normal            Nose- Clear, no-Septal dev, mucus, polyps, erosion, perforation             Throat- Mallampati III-IV , mucosa clear , drainage- none, tonsils- atrophic Neck- flexible , trachea midline, no stridor , thyroid nl, carotid no bruit Chest - symmetrical excursion , unlabored           Heart/CV- RRR , no murmur , no gallop  , no rub, nl s1 s2                           - JVD+2cm , edema+3, stasis changes+, varices- none           Lung- + coarse rhonchi R>L, unlabored, wheeze- none, cough- none , dullness-none, rub- none           Chest wall-  Abd-  Br/ Gen/ Rectal- Not done, not indicated Extrem- cyanosis- none, clubbing, none, atrophy-  none, strength- nl, +scoliosis Neuro- grossly intact to observation

## 2013-12-19 NOTE — Patient Instructions (Signed)
Neb xop 0.63  Depo 76  Order- CXR- dx COPD exacerbation  Order labs- CBC w diff, BMET, D-dimer, pro B nat peptide     Dx COPD acute exacerbation  Please call as needed. Go to the ER if you get worse

## 2013-12-19 NOTE — Assessment & Plan Note (Signed)
No effort to stop- emphasis on counselling. She is running out of time.

## 2013-12-20 LAB — PRO B NATRIURETIC PEPTIDE: PRO B NATRI PEPTIDE: 870.5 pg/mL — AB (ref <126)

## 2013-12-23 ENCOUNTER — Inpatient Hospital Stay: Payer: Self-pay | Admitting: Internal Medicine

## 2013-12-23 ENCOUNTER — Telehealth: Payer: Self-pay | Admitting: Internal Medicine

## 2013-12-23 DIAGNOSIS — I517 Cardiomegaly: Secondary | ICD-10-CM

## 2013-12-23 DIAGNOSIS — J441 Chronic obstructive pulmonary disease with (acute) exacerbation: Secondary | ICD-10-CM

## 2013-12-23 LAB — COMPREHENSIVE METABOLIC PANEL
Albumin: 2.6 g/dL — ABNORMAL LOW (ref 3.4–5.0)
Alkaline Phosphatase: 38 U/L — ABNORMAL LOW
Anion Gap: 6 — ABNORMAL LOW (ref 7–16)
BUN: 12 mg/dL (ref 7–18)
Bilirubin,Total: 0.5 mg/dL (ref 0.2–1.0)
Calcium, Total: 8 mg/dL — ABNORMAL LOW (ref 8.5–10.1)
Chloride: 100 mmol/L (ref 98–107)
Co2: 35 mmol/L — ABNORMAL HIGH (ref 21–32)
Creatinine: 0.69 mg/dL (ref 0.60–1.30)
Glucose: 114 mg/dL — ABNORMAL HIGH (ref 65–99)
Osmolality: 282 (ref 275–301)
POTASSIUM: 3.7 mmol/L (ref 3.5–5.1)
SGOT(AST): 25 U/L (ref 15–37)
SGPT (ALT): 21 U/L
Sodium: 141 mmol/L (ref 136–145)
TOTAL PROTEIN: 5.9 g/dL — AB (ref 6.4–8.2)

## 2013-12-23 LAB — TROPONIN I
Troponin-I: <0.02 ng/mL
Troponin-I: <0.02 ng/mL
Troponin-I: <0.02 ng/mL

## 2013-12-23 LAB — CBC
HCT: 51.6 % — ABNORMAL HIGH (ref 35.0–47.0)
HGB: 16 g/dL (ref 12.0–16.0)
MCH: 29.2 pg (ref 26.0–34.0)
MCHC: 30.9 g/dL — ABNORMAL LOW (ref 32.0–36.0)
MCV: 94 fL (ref 80–100)
Platelet: 280 10*3/uL (ref 150–440)
RBC: 5.46 10*6/uL — ABNORMAL HIGH (ref 3.80–5.20)
RDW: 17 % — AB (ref 11.5–14.5)
WBC: 8.9 10*3/uL (ref 3.6–11.0)

## 2013-12-23 LAB — PRO B NATRIURETIC PEPTIDE: B-TYPE NATIURETIC PEPTID: 2318 pg/mL — AB (ref 0–125)

## 2013-12-23 LAB — CK TOTAL AND CKMB (NOT AT ARMC)
CK, TOTAL: 113 U/L
CK, TOTAL: 117 U/L
CK, Total: 90 U/L
CK-MB: 2.1 ng/mL (ref 0.5–3.6)
CK-MB: 2.5 ng/mL (ref 0.5–3.6)
CK-MB: 2 ng/mL (ref 0.5–3.6)

## 2013-12-23 LAB — TSH: Thyroid Stimulating Horm: 0.738 u[IU]/mL

## 2013-12-23 NOTE — Telephone Encounter (Signed)
Pt called back-states she was given Lasix 5 tablets on Thursday last week; has taken them all ans continues to have the same amount of swelling in her feet(toes not touching the floor so swollen and can't tie shoes). She continues to have SOB with activity; at rest she is fine. [name] aware of her labs and ONO room air has been ordered for patient.   Pt needs to know what to do next. CY Please advise. [name] currently at work.

## 2013-12-23 NOTE — Telephone Encounter (Signed)
Pt returned Katie's call.  Advised pt to go to her nearest ER for immediate evaluation.  Pt was audibly gasping on the phone - asked pt to please go now.  Pt voiced her understanding and will go to [facility] immediately.   Nothing further needed at this time; will sign off.

## 2013-12-23 NOTE — Telephone Encounter (Signed)
Called number provided and ext -asked for patient-was placed on hold for 10 minutes. Will call back again later.

## 2013-12-23 NOTE — Telephone Encounter (Signed)
Per conversation with CY-have patient to to closest ER to be evaluated.   Called patient at work number as requested-was told she has went to lunch and would be back anytime now. I requested that work have her call us back asap. Schedulers aware to get me to the phone as soon as patient call back.

## 2013-12-24 ENCOUNTER — Other Ambulatory Visit: Payer: Self-pay | Admitting: Nurse Practitioner

## 2013-12-24 ENCOUNTER — Encounter: Payer: Self-pay | Admitting: Nurse Practitioner

## 2013-12-24 DIAGNOSIS — I509 Heart failure, unspecified: Secondary | ICD-10-CM

## 2013-12-24 DIAGNOSIS — J9600 Acute respiratory failure, unspecified whether with hypoxia or hypercapnia: Secondary | ICD-10-CM

## 2013-12-24 DIAGNOSIS — I5031 Acute diastolic (congestive) heart failure: Secondary | ICD-10-CM

## 2013-12-24 LAB — BASIC METABOLIC PANEL
Anion Gap: 7 (ref 7–16)
BUN: 14 mg/dL (ref 7–18)
CALCIUM: 8.5 mg/dL (ref 8.5–10.1)
CO2: 37 mmol/L — AB (ref 21–32)
CREATININE: 0.69 mg/dL (ref 0.60–1.30)
Chloride: 98 mmol/L (ref 98–107)
EGFR (African American): >60
EGFR (Non-African Amer.): >60
Glucose: 118 mg/dL — ABNORMAL HIGH (ref 65–99)
OSMOLALITY: 285 (ref 275–301)
Potassium: 4.2 mmol/L (ref 3.5–5.1)
SODIUM: 142 mmol/L (ref 136–145)

## 2013-12-24 LAB — CBC WITH DIFFERENTIAL/PLATELET
Basophil #: 0 10*3/uL (ref 0.0–0.1)
Basophil %: 0.3 %
Eosinophil #: 0 10*3/uL (ref 0.0–0.7)
Eosinophil %: 0 %
HCT: 53.4 % — AB (ref 35.0–47.0)
HGB: 16.2 g/dL — AB (ref 12.0–16.0)
Lymphocyte #: 0.6 10*3/uL — ABNORMAL LOW (ref 1.0–3.6)
Lymphocyte %: 7.5 %
MCH: 28.4 pg (ref 26.0–34.0)
MCHC: 30.4 g/dL — ABNORMAL LOW (ref 32.0–36.0)
MCV: 93 fL (ref 80–100)
Monocyte #: 0.3 x10 3/mm (ref 0.2–0.9)
Monocyte %: 3.5 %
NEUTROS ABS: 6.7 10*3/uL — AB (ref 1.4–6.5)
NEUTROS PCT: 88.7 %
Platelet: 266 10*3/uL (ref 150–440)
RBC: 5.71 10*6/uL — AB (ref 3.80–5.20)
RDW: 17.3 % — ABNORMAL HIGH (ref 11.5–14.5)
WBC: 7.6 10*3/uL (ref 3.6–11.0)

## 2013-12-24 LAB — LIPID PANEL
Cholesterol: 128 mg/dL (ref 0–200)
HDL Cholesterol: 50 mg/dL (ref 40–60)
LDL CHOLESTEROL, CALC: 65 mg/dL (ref 0–100)
TRIGLYCERIDES: 65 mg/dL (ref 0–200)
VLDL CHOLESTEROL, CALC: 13 mg/dL (ref 5–40)

## 2013-12-24 LAB — HEMOGLOBIN A1C: HEMOGLOBIN A1C: 6 % (ref 4.2–6.3)

## 2013-12-26 ENCOUNTER — Telehealth: Payer: Self-pay | Admitting: Internal Medicine

## 2013-12-26 NOTE — Telephone Encounter (Signed)
Spoke with the pt  She states that she was d/c's from [facility] yesterday and was sent home on o2 24/7  She wants f/u this wk to discuss  Ov with CDY for 9:30 am tomorrow

## 2013-12-27 ENCOUNTER — Encounter: Payer: Self-pay | Admitting: Internal Medicine

## 2013-12-27 ENCOUNTER — Ambulatory Visit (INDEPENDENT_AMBULATORY_CARE_PROVIDER_SITE_OTHER): Payer: 59 | Admitting: Internal Medicine

## 2013-12-27 VITALS — BP 140/60 | HR 86 | Ht 66.0 in | Wt 261.0 lb

## 2013-12-27 DIAGNOSIS — Z23 Encounter for immunization: Secondary | ICD-10-CM

## 2013-12-27 DIAGNOSIS — J432 Centrilobular emphysema: Secondary | ICD-10-CM

## 2013-12-27 DIAGNOSIS — F17200 Nicotine dependence, unspecified, uncomplicated: Secondary | ICD-10-CM

## 2013-12-27 DIAGNOSIS — J438 Other emphysema: Secondary | ICD-10-CM

## 2013-12-27 DIAGNOSIS — J439 Emphysema, unspecified: Secondary | ICD-10-CM

## 2013-12-27 NOTE — Progress Notes (Signed)
Kelly Hale, Kelly Hale at 06/17/2011  4:25 PM      Status: Signed            12//3/12- 50 yoF smoker followed for chronic bronchitis, tobacco use LOV- 05/06/10 Did well through the summer. Dr. Larena Glassman increased Advair to 500/50 without benefit. As cooler weather came in, she noticed increased wheeze, cough productive of white or trace yellow sputum. 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 She rates okay at night except that wheeze delays her sleep onset. Still smoking one pack per day and resists admitting that it is causing any problem. She says her husband is similar but doesn't smoke. She chokes easily with liquids. CXR- 05/06/10- bronchitis  06/17/11- [name] smoker followed for chronic bronchitis, tobacco use CXR 05/06/10 reviewed w/ her-   1. No active lung disease.   2. Peribronchial thickening consistent with bronchitis.   3. Thoracic scoliosis.   Provider: Lorina Rabon    She felt okay through this winter with no significant respiratory infection. Dislikes cold weather. Does not expect a pollen problems the spring. Continues Advair 500 and uses her rescue inhaler once every 3 days. Little cough or drainage usually. Pain in right arm and right side of neck hurts enough to keep her awake. 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 We had given samples Spiriva with no effect. Continues to smoke against advice. PFT: 03/22/2011-FEV1 1.49/58%, FEV1/FVC 0.62, insignificant response to bronchodilator, RV 163% indicating air trapping, DLCO 72%. Moderate obstructive airways disease without response to bronchodilator. Air-trapping.  Assessment:   COPD with bronchitis - Kelly Hale, Kelly Hale at 06/19/2011  8:45 PM      Status: Written Related Problem: BRONCHITIS    Chronic bronchitis pattern in context of moderately severe COPD with poor response to bronchodilator on PFTs. We discussed this as I reviewed the PFTs. Weight loss and smoking  cessation of the most important things she could do. Plan-try Brovana nebulizer treatment here       Neuropathic pain, arm - Kelly Hale, Kelly Hale at 06/19/2011  8:53 PM      Status: Written Related Problem: Neuropathic pain, arm    She describes pain in the right side of neck and into the right arm with numbness in the thumb and first 2 fingers of the right hand. 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 Chest x-ray does not show a Pancoast tumor. She is to speak with her primary physician about it, anticipating probable referral.  TOBACCO USER - Kelly Hale, Kelly Hale at 06/19/2011  8:53 PM      Status: Written Related Problem: TOBACCO USER    I tried to engage her in discussion of smoking cessation approaches. She is not motivated.    ----------------------------------------------------------------------- 08/22/13- 57 yoF smoker (1ppd/ 30 pk yr) with COPD/bronchitis/asthma complicated by tobacco use Mother is Kelly Hale Cough was worse for 3 weeks but now back at baseline. Denies routine daily cough, but still some green sputum. Took Levaquin for 10 days, prednisone for 10 days ending 4 days ago. Baseline dyspnea on exertion without blood, swelling, chest pain, palpitation. Using [place]. Advair 500, nebulizer albuterol, rescue inhaler. 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 CXR 08/12/13 IMPRESSION:  No acute infiltrate or pulmonary edema. Thoracic dextroscoliosis  again noted. Central mild bronchitic changes.  Electronically Signed  By: Kelly Hale  M.D.  On: 08/12/2013 13:22   12/19/13- 15 yoF smoker (1ppd/ 30 pk yr) with COPD/bronchitis/asthma complicated by tobacco use Mother is Kelly Hale FOLLOWS FOR:sob,wheeze x 1 wk.,bilat. ankle and leg edema,cough-clear,no fcs,midchest tightness and pain She feels like asthma, coughing clear mucus, persistent midsternal soreness- not radiating or  pleuritic, feet swelling a lot.  On arrival here sat mid-70% on room air walking. 90% on O2 2l at rest  12/27/13- 57 yoF former smoker (1ppd/ 30 pk yr) with COPD/bronchitis/asthma complicated by tobacco use Mother is Kelly Hale             Husband here FOLLOWS FOR: Pt here after hospital d/c from [place](summary pending). Pt c/o DOE, prod cough with little yellow mucus. Pt denies CP/tightness. Dx'd diastolic  CHF. Discharged on O2 2L. Pt was 83% RA O2 sat upon arrival to exam room , pt took several deep breaths and increased O2 to 91% and sustained at 90-91% while at rest. 83% walking on room air. Quit smoking in September.  CXR 12/19/13 IMPRESSION:  Cardiomegaly and emphysema without acute disease.  Electronically Signed  By: Kelly Hale M.D.  On: 12/19/2013 10:09   ROS-see HPI Constitutional:   No-   weight loss, night sweats, fevers, chills, fatigue, lassitude. HEENT:   No-  headaches, difficulty swallowing, tooth/dental problems, sore throat,       No-  sneezing, itching, ear ache, +nasal congestion, +post nasal drip,  CV:  No-chest pain, orthopnea, PND, +swelling in lower extremities, anasarca,                                  dizziness, palpitations Resp: +shortness of breath with exertion or at rest.              +productive cough,  No non-productive cough,  No- coughing up of blood.              +change in color of mucus.  No- wheezing.   Skin: No-   rash or lesions. GI:  No-   heartburn, indigestion, abdominal pain, nausea, vomiting,  GU:  MS:  No-   joint pain or swelling.  Neuro-     nothing unusual Psych:  No- change in mood or affect. No depression or anxiety.  No memory loss.  OBJ- Physical Exam On our O2 2l General- Alert, Oriented, Affect-appropriate, Distress- none acute/ calm, +obese Skin- rash-none, lesions- none, excoriation- none Lymphadenopathy- none Head- atraumatic            Eyes- Gross vision intact, PERRLA, conjunctivae and secretions clear             Ears- Hearing, canals-normal            Nose- Clear, no-Septal dev, mucus, polyps, erosion, perforation             Throat- Mallampati III-IV , mucosa clear , drainage- none, tonsils- atrophic Neck- flexible , trachea midline, no stridor , thyroid nl, carotid no bruit Chest - symmetrical excursion , unlabored           Heart/CV- RRR , no murmur , no gallop  , no rub, nl s1 s2                           - JVD-none , edema+2, stasis changes+, varices- none           Lung- + coarse rhonchi R>L,  unlabored, wheeze+slight, cough- none , dullness-none,                         rub- none           Chest wall-  Abd-  Br/ Gen/ Rectal- Not done, not indicated Extrem- cyanosis- none, clubbing, none, atrophy- none, strength- nl, +scoliosis Neuro- grossly intact to observation

## 2013-12-27 NOTE — Patient Instructions (Addendum)
I am proud of you for quitting smoking !!  We can continue present meds- finishing the augmentin antibiotic  I took [place] off your med list for now  Order- [facility]- Home O2 is 2L for sleep and portable for exertion   Dx COPD with emphysema  Flu vax   Walk on room air for oximetry  Medical leave of absence/ Short term disability for dx COPD with emphysema 12/23/13 until 02/10/14

## 2014-01-08 ENCOUNTER — Ambulatory Visit: Payer: Self-pay | Admitting: Family

## 2014-01-10 NOTE — Assessment & Plan Note (Signed)
Says she has just quit. I strongly encouraged her to stay off and discussed available support. She is still wearing nicotine patch- discussed.

## 2014-01-10 NOTE — Assessment & Plan Note (Signed)
Recent acute exacerbation of COPD, leading to hospital stay. I am concerned she will be unstable until off tobacco longer. She will  very likely qualify for home O2. If so, then she won't be able to work and will need to discuss disability. She has moderately severe COPD based on PFTs from 03/22/11 and will likely score worse when we can update test.  Plan [place] room air / sleep, exertion. Flu vaccine given. Meds discussed.

## 2014-02-10 ENCOUNTER — Ambulatory Visit: Payer: 59 | Admitting: *Deleted

## 2014-02-10 ENCOUNTER — Encounter: Payer: Self-pay | Admitting: Internal Medicine

## 2014-02-10 ENCOUNTER — Ambulatory Visit (INDEPENDENT_AMBULATORY_CARE_PROVIDER_SITE_OTHER): Payer: 59 | Admitting: Internal Medicine

## 2014-02-10 VITALS — BP 120/74 | HR 87 | Ht 66.0 in | Wt 267.6 lb

## 2014-02-10 DIAGNOSIS — J449 Chronic obstructive pulmonary disease, unspecified: Secondary | ICD-10-CM

## 2014-02-10 DIAGNOSIS — IMO0001 Reserved for inherently not codable concepts without codable children: Secondary | ICD-10-CM

## 2014-02-10 DIAGNOSIS — I5020 Unspecified systolic (congestive) heart failure: Secondary | ICD-10-CM

## 2014-02-10 DIAGNOSIS — Z23 Encounter for immunization: Secondary | ICD-10-CM

## 2014-02-10 DIAGNOSIS — H60399 Other infective otitis externa, unspecified ear: Secondary | ICD-10-CM | POA: Insufficient documentation

## 2014-02-10 DIAGNOSIS — J432 Centrilobular emphysema: Secondary | ICD-10-CM

## 2014-02-10 DIAGNOSIS — I5030 Unspecified diastolic (congestive) heart failure: Secondary | ICD-10-CM

## 2014-02-10 DIAGNOSIS — H60391 Other infective otitis externa, right ear: Secondary | ICD-10-CM

## 2014-02-10 MED ORDER — HYDROCODONE-HOMATROPINE 5-1.5 MG/5ML PO SYRP: ORAL_SOLUTION | ORAL | Status: DC

## 2014-02-10 NOTE — Patient Instructions (Addendum)
Script for cough syrup  Order- office spirometry    Dx COPD with bronchitis-unable to perform test due to computer system---will need to check at next OV.               Walk test on room air  [place] to take the left-over augmentin  Ask the cardiologist about going back to work  [facility]-order portable O2 2L/M with exertion DX COPD with bronchitis, Left Ventricular Systolic CHF

## 2014-02-10 NOTE — Assessment & Plan Note (Signed)
Acute otitis externa with eustachian discomfort Plan- she has augmentin and will take that

## 2014-02-10 NOTE — Assessment & Plan Note (Addendum)
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  Plan- Add O2 2L with exertion to her current sleep O2.

## 2014-02-10 NOTE — Progress Notes (Signed)
Kelly Lever, MD at 06/17/2011  4:25 PM      Status: Signed            12//3/12- 57 yoF smoker followed for chronic bronchitis, tobacco use LOV- 05/06/10 Did well through the summer. Dr. Larena Glassman increased Advair to 500/50 without benefit. As cooler weather came in, she noticed increased wheeze, cough productive of white or trace yellow sputum. 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 She rates okay at night except that wheeze delays her sleep onset. Still smoking one pack per day and resists admitting that it is causing any problem. She says her husband is similar but doesn't smoke. She chokes easily with liquids. CXR- 05/06/10- bronchitis  06/17/11- [name] smoker followed for chronic bronchitis, tobacco use CXR 05/06/10 reviewed w/ her-   1. No active lung disease.   2. Peribronchial thickening consistent with bronchitis.   3. Thoracic scoliosis.   Provider: Lorina Hale    She felt okay through this winter with no significant respiratory infection. Dislikes cold weather. Does not expect a pollen problems the spring. Continues Advair 500 and uses her rescue inhaler once every 3 days. Little cough or drainage usually. Pain in right arm and right side of neck hurts enough to keep her awake. 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 We had given samples Spiriva with no effect. Continues to smoke against advice. PFT: 03/22/2011-FEV1 1.49/58%, FEV1/FVC 0.62, insignificant response to bronchodilator, RV 163% indicating air trapping, DLCO 72%. Moderate obstructive airways disease without response to bronchodilator. Air-trapping.  Assessment:   COPD with bronchitis - Kelly Lever, MD at 06/19/2011  8:45 PM      Status: Written Related Problem: BRONCHITIS    Chronic bronchitis pattern in context of moderately severe COPD with poor response to bronchodilator on PFTs. We discussed this as I reviewed the PFTs. Weight loss and smoking  cessation of the most important things she could do. Plan-try Brovana nebulizer treatment here       Neuropathic pain, arm - Kelly Lever, MD at 06/19/2011  8:53 PM      Status: Written Related Problem: Neuropathic pain, arm    She describes pain in the right side of neck and into the right arm with numbness in the thumb and first 2 fingers of the right hand. 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 Chest x-ray does not show a Pancoast tumor. She is to speak with her primary physician about it, anticipating probable referral.  TOBACCO USER - Kelly Lever, MD at 06/19/2011  8:53 PM      Status: Written Related Problem: TOBACCO USER    I tried to engage her in discussion of smoking cessation approaches. She is not motivated.    ----------------------------------------------------------------------- 08/22/13- 57 yoF smoker (1ppd/ 30 pk yr) with COPD/bronchitis/asthma complicated by tobacco use Mother is Kelly Hale Cough was worse for 3 weeks but now back at baseline. Denies routine daily cough, but still some green sputum. Took Levaquin for 10 days, prednisone for 10 days ending 4 days ago. Baseline dyspnea on exertion without blood, swelling, chest pain, palpitation. Using [place]. Advair 500, nebulizer albuterol, rescue inhaler. 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 CXR 08/12/13 IMPRESSION:  No acute infiltrate or pulmonary edema. Thoracic dextroscoliosis  again noted. Central mild bronchitic changes.  Electronically Signed  By: Kelly Hale  M.D.  On: 08/12/2013 13:22   12/19/13- 10 yoF smoker (1ppd/ 30 pk yr) with COPD/bronchitis/asthma complicated by tobacco use Mother is Kelly Hale FOLLOWS FOR:sob,wheeze x 1 wk.,bilat. ankle and leg edema,cough-clear,no fcs,midchest tightness and pain She feels like asthma, coughing clear mucus, persistent midsternal soreness- not radiating or  pleuritic, feet swelling a lot.  On arrival here sat mid-70% on room air walking. 90% on O2 2l at rest  12/27/13- 57 yoF former smoker (1ppd/ 30 pk yr) with COPD/bronchitis/asthma complicated by tobacco use Mother is Kelly Hale             Husband here FOLLOWS FOR: Pt here after hospital d/c from [facility](summary pending). Pt c/o DOE, prod cough with little yellow mucus. Pt denies CP/tightness. Dx'd diastolic  CHF. Discharged on O2 2L. Pt was 83% RA O2 sat upon arrival to exam room , pt took several deep breaths and increased O2 to 91% and sustained at 90-91% while at rest. 83% walking on room air. Quit smoking in September.  CXR 12/19/13 IMPRESSION:  Cardiomegaly and emphysema without acute disease.  Electronically Signed  By: Kelly Hale M.D.  On: 12/19/2013 10:09  02/10/14- 58 yoF former smoker (1ppd/ 30 pk yr) with COPD/bronchitis/asthma complicated by tobacco use, CHF Mother is Kelly Hale            Husband here FOLLOWS FOR: continues to wear O2  2Lat night through [facility]; states breathing has been doing well since last visit. Would like to have a refill of cough syrup as well. Rare need for Proair.  Concerned about ability to return to work unloading truck after hosp for [facility]. Sees cardiology in 2 days. Notes DOE walking house to truck.  Acute problem- pain right ear and behind angle of jaw. Not popping.  ROS-see HPI Constitutional:   No-   weight loss, night sweats, fevers, chills, fatigue, lassitude. HEENT:   No-  headaches, difficulty swallowing, tooth/dental problems, sore throat,       No-  sneezing, itching, ear ache, +nasal congestion, +post nasal drip,  CV:  No-chest pain, orthopnea, PND, +swelling in lower extremities, anasarca,                                  dizziness, palpitations Resp: +shortness of breath with exertion or at rest.              +productive cough,  No non-productive cough,  No- coughing up of blood.              +change in color of mucus.  No-  wheezing.   Skin: No-   rash or lesions. GI:  No-   heartburn, indigestion, abdominal pain, nausea, vomiting,  GU:  MS:  No-   joint pain or swelling.  Neuro-     nothing unusual Psych:  No- change in mood or affect. No depression or anxiety.  No memory loss.  OBJ- Physical Exam . +Resting room air sat 90% Walking 370 feet on room air -desat to 88%. On 3L O2 sat 95%. General- Alert, Oriented, Affect-appropriate, Distress- none acute/ calm, +obese Skin- rash-none, lesions- none, excoriation- none Lymphadenopathy- none Head- atraumatic            Eyes- Gross vision intact, PERRLA, conjunctivae and secretions clear            Ears- +R external canal is red, no fluid, mild retraction  Nose- Clear, no-Septal dev, mucus, polyps, erosion, perforation             Throat- Mallampati III-IV , mucosa clear , drainage- none, tonsils- atrophic Neck- flexible , trachea midline, no stridor , thyroid nl, carotid no bruit Chest - symmetrical excursion , unlabored           Heart/CV- RRR , no murmur , no gallop  , no rub, nl s1 s2                           - JVD-none , edema+1-2, stasis changes+, varices- none           Lung- +diminished, unlabored, wheeze+slight, cough- none , dullness-none,                         rub- none           Chest wall-  Abd-  Br/ Gen/ Rectal- Not done, not indicated Extrem- cyanosis- none, clubbing, none, atrophy- none, strength- nl, +scoliosis Neuro- grossly intact to observation

## 2014-02-11 ENCOUNTER — Other Ambulatory Visit: Payer: Self-pay | Admitting: Internal Medicine

## 2014-02-11 ENCOUNTER — Telehealth: Payer: Self-pay | Admitting: Internal Medicine

## 2014-02-11 DIAGNOSIS — J432 Centrilobular emphysema: Secondary | ICD-10-CM

## 2014-02-11 DIAGNOSIS — I5030 Unspecified diastolic (congestive) heart failure: Secondary | ICD-10-CM

## 2014-02-11 MED ORDER — AMOXICILLIN-POT CLAVULANATE 875-125 MG PO TABS
1.0000 | ORAL_TABLET | Freq: Two times a day (BID) | ORAL | Status: DC
Start: 2014-02-11 — End: 2014-05-06

## 2014-02-11 NOTE — Telephone Encounter (Signed)
Called spoke with pt. She reports she failed the titration for portable O2 d/t her sats not stay in the 90's. Pt reports since she can't get a portable O2 then she is not able to work. Requesting recs. Please advise thanks

## 2014-02-11 NOTE — Telephone Encounter (Signed)
[phone] calling back

## 2014-02-11 NOTE — Telephone Encounter (Signed)
CY the pts next OV is not until 05/13/14.  Still ok to send a letter to be out of work until then?  thanks

## 2014-02-11 NOTE — Telephone Encounter (Signed)
augmentin [phone], # 20 1 twice daily

## 2014-02-11 NOTE — Telephone Encounter (Signed)
Per 02/10/14 OV; Patient Instructions       Script for cough syrup Order- office spirometry    Dx COPD with bronchitis-unable to perform test due to computer system---will need to check at next [place].   Walk test on room air [place] to take the left-over augmentin Ask the cardiologist about going back to work [facility]-order portable O2 2L/M with exertion DX COPD with bronchitis, Left Ventricular Systolic CHF    Called pt and LMTCB x1

## 2014-02-11 NOTE — Telephone Encounter (Signed)
We can send her letter of medical leave of absence until her next ov date.

## 2014-02-11 NOTE — Telephone Encounter (Signed)
[name] going to contact her Disability rep and see if there are any time frame limitations to her being out of work. [name] wanting to avoid messing up her STD and possibly having to fill out another claim/application. Pt states that she is not too sure if her STD will cover her to be out of work x 2 months. The patient states that she will call us once she speaks with Disability to let us know how to write letter.   Will await call back from patient.

## 2014-02-11 NOTE — Telephone Encounter (Signed)
Can you get her back with me  In 2 months and write letter till then?

## 2014-02-11 NOTE — Telephone Encounter (Signed)
PT states that she thought she had left over Augmentin at home but realized she does not.  CY please advise if another abx to be called in.  Allergies  Allergen Reactions  . Aspirin     REACTION: intol-asthma    Current Outpatient Prescriptions on File Prior to Visit  Medication Sig Dispense Refill  . albuterol (PROAIR HFA) 108 (90 BASE) MCG/ACT inhaler Inhale 2 puffs into the lungs 4 (four) times daily as needed. 3 Inhaler 3  . albuterol (PROVENTIL) (2.5 MG/3ML) 0.083% nebulizer solution Take 3 mLs (2.5 mg total) by nebulization every 6 (six) hours as needed for wheezing or shortness of breath. 100 vial 11  . amLODipine (NORVASC) 5 MG tablet Take 1 tablet by mouth daily.    Marland Kitchen amoxicillin-clavulanate (AUGMENTIN) 875-125 MG per tablet Take 1 tablet by mouth 2 (two) times daily. 14 tablet 0  . Fluticasone-Salmeterol (ADVAIR) 500-50 MCG/DOSE AEPB Inhale 1 puff into the lungs every 12 (twelve) hours.    . furosemide (LASIX) 20 MG tablet 2 today then one daily 5 tablet 0  . HYDROcodone-homatropine (HYCODAN) 5-1.5 MG/5ML syrup 5 ml every 6 hours if needed for cough 200 mL 0  . Multiple Vitamin (MULTIVITAMIN) capsule Take 1 capsule by mouth daily.    . Vitamin D, Ergocalciferol, (DRISDOL) 50000 UNITS CAPS capsule Take 1 capsule by mouth once a week.     No current facility-administered medications on file prior to visit.

## 2014-02-12 ENCOUNTER — Encounter: Payer: Self-pay | Admitting: Cardiovascular Disease

## 2014-02-12 ENCOUNTER — Ambulatory Visit (INDEPENDENT_AMBULATORY_CARE_PROVIDER_SITE_OTHER): Payer: 59 | Admitting: Cardiovascular Disease

## 2014-02-12 ENCOUNTER — Ambulatory Visit: Payer: Self-pay | Admitting: Family

## 2014-02-12 ENCOUNTER — Ambulatory Visit: Payer: 59 | Admitting: Cardiovascular Disease

## 2014-02-12 VITALS — BP 136/80 | HR 90 | Ht 66.0 in | Wt 266.8 lb

## 2014-02-12 DIAGNOSIS — F17200 Nicotine dependence, unspecified, uncomplicated: Secondary | ICD-10-CM

## 2014-02-12 DIAGNOSIS — Z720 Tobacco use: Secondary | ICD-10-CM

## 2014-02-12 DIAGNOSIS — R0602 Shortness of breath: Secondary | ICD-10-CM

## 2014-02-12 DIAGNOSIS — J45909 Unspecified asthma, uncomplicated: Secondary | ICD-10-CM

## 2014-02-12 DIAGNOSIS — J432 Centrilobular emphysema: Secondary | ICD-10-CM

## 2014-02-12 DIAGNOSIS — I5030 Unspecified diastolic (congestive) heart failure: Secondary | ICD-10-CM

## 2014-02-12 NOTE — Assessment & Plan Note (Signed)
We have encouraged continued exercise, careful diet management in an effort to lose weight. 

## 2014-02-12 NOTE — Addendum Note (Signed)
Addended by: Minna Merritts on: 02/12/2014 06:05 PM   Modules accepted: Level of Service

## 2014-02-12 NOTE — Progress Notes (Signed)
Patient ID: Kelly Hale, female    DOB: November 10, 1955, 58 y.o.   MRN: [id number]  HPI Comments: Kelly Hale is a 58 year old woman with obesity, long smoking history, COPD, hypertension, diastolic CHF who presents for follow-up after recent hospitalization 12/23/2013 at [facility] for acute respiratory failure with acute on chronicdiastolic CHF. She received antibiotics, prednisone taper, aggressive diuresis with improvement of her symptoms. She presents to establish care in the [facility] records were reviewed  Reports that she was recently seen by pulmonary, Dr. Annamaria Boots. Prior to her admission she was given Lasix and Augmentin. Despite the Lasix she had progressive lower extremity swelling, shortness of breath. Also had cough, wheezing, chest pressure with her shortness of breath. Also reported having 2 days of diarrhea after the antibiotics. She was taking her nebulizers at home.  In follow-up today, she reports that she feels better, minimal lower extremity edema She did a walk test and reports having oxygen saturation in the mid to low 80s. 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 She is scheduled to retire in 1 month She denies any PND or orthopnea, minimal lower extremity edema of the right lower extremity, none on the left Breathing feels fine at rest, often does not need oxygen when sitting. 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 albuterol (PROAIR HFA) 108 (90 BASE) MCG/ACT inhaler Inhale 2 puffs into the lungs 4 (four) times daily as needed.  Marland Kitchen albuterol (PROVENTIL) (2.5 MG/3ML) 0.083% nebulizer solution Take 3 mLs (2.5 mg total) by nebulization every 6 (six) hours as needed for wheezing or shortness of breath.  Marland Kitchen amLODipine (NORVASC) 5 MG tablet Take 1 tablet by mouth daily.  Marland Kitchen amoxicillin-clavulanate (AUGMENTIN) 875-125 MG per tablet Take 1 tablet by mouth 2 (two) times daily.  . Fluticasone-Salmeterol (ADVAIR) 500-50 MCG/DOSE AEPB Inhale 1 puff into the lungs every 12 (twelve) hours.  . furosemide (LASIX) 20 MG tablet Take 20 mg by mouth daily.  Marland Kitchen HYDROcodone-homatropine (HYCODAN) 5-1.5 MG/5ML syrup 5 ml every 6 hours if needed for cough  . Multiple Vitamin (MULTIVITAMIN) capsule Take 1 capsule by mouth daily.  . nicotine (NICODERM CQ - DOSED IN MG/24 HOURS) 21 mg/24hr patch Place 21 mg onto the skin daily.   . OXYGEN Inhale into the lungs. 2 liters at bedtime.  . Vitamin D, Ergocalciferol, (DRISDOL) 50000 UNITS CAPS capsule Take 1 capsule by mouth once a week.    Review of Systems  Constitutional: Negative.   Respiratory: Positive for shortness of breath.   Cardiovascular: Positive for leg swelling.  Gastrointestinal: Negative.   Endocrine: Negative.   Musculoskeletal: Negative.   Neurological: Negative.  Psychiatric/Behavioral: Negative.   All other systems reviewed and are negative.   BP 136/80 mmHg  Pulse 90  Ht 5\' 6"  (1.676 m)  Wt 266 lb 12 oz (120.997 kg)  BMI 43.08 kg/m2  Physical Exam  Constitutional: She is oriented to person, place, and time. She appears well-developed  and well-nourished.  obese  HENT:  Head: Normocephalic.  Nose: Nose normal.  Mouth/Throat: Oropharynx is clear and moist.  Eyes: Conjunctivae are normal. Pupils are equal, round, and reactive to light.  Neck: Normal range of motion. Neck supple. No JVD present.  Cardiovascular: Normal rate, regular rhythm, S1 normal, S2 normal, normal heart sounds and intact distal pulses.  Exam reveals no gallop and no friction rub.   No murmur heard. Pulmonary/Chest: Effort normal. No respiratory distress. She has decreased breath sounds. She has no wheezes. She has no rales. She exhibits no tenderness.  Abdominal: Soft. Bowel sounds are normal. She exhibits no distension. There is no tenderness.  Musculoskeletal: Normal range of motion. She exhibits no edema or tenderness.  Lymphadenopathy:    She has no cervical adenopathy.  Neurological: She is alert and oriented to person, place, and time. Coordination normal.  Skin: Skin is warm and dry. No rash noted. No erythema.  Psychiatric: She has a normal mood and affect. Her behavior is normal. Judgment and thought content normal.    Assessment and Plan  Nursing note and vitals reviewed.

## 2014-02-12 NOTE — Assessment & Plan Note (Signed)
She had aggressive diuresis during her hospital course Most of her visit today was spent discussing CHF management. Recommended that she weigh herself. Suggested she increase Lasix up to 40 mg daily and decrease her fluid intake for 3 pound weight gain or worsening leg edema on the left. suggested she call our office if symptoms get worse

## 2014-02-12 NOTE — Assessment & Plan Note (Signed)
She reports a long history of asthma even as a child. Unclear if this is contributing to her respiratory issues

## 2014-02-12 NOTE — Assessment & Plan Note (Signed)
We have encouraged her to continue to work on weaning her cigarettes and smoking cessation. She will continue to work on this and does not want any assistance with chantix.  

## 2014-02-12 NOTE — Assessment & Plan Note (Signed)
Recent hypoxia on walking cast, now on continuous flow oxygen, pulled out of work. She would like follow-up in [facility] for pulmonary

## 2014-02-12 NOTE — Patient Instructions (Addendum)
You are doing well. No medication changes were made.  Please use extra lasix as needed for weight gain, foot swelling, shortness of breath, cough while laying flat  Please call us if you have new issues that need to be addressed before your next appt.  Your physician wants you to follow-up in: 6 months.  You will receive a reminder letter in the mail two months in advance. If you don't receive a letter, please call our office to schedule the follow-up appointment.  Your next appointment will be scheduled in our new office located at :  [place]  [address], [place]  [place], [place] [address]

## 2014-02-13 ENCOUNTER — Telehealth: Payer: Self-pay | Admitting: Internal Medicine

## 2014-02-13 NOTE — Telephone Encounter (Signed)
Called and spoke to [place] with [facility]. Informed Butch Penny that the pt is O2 is 2lpm with exertion and at night per CY's last OV note from 02/10/14. Butch Penny verbalized understanding and stated she would clarify this with the pt. Nothing further needed at this time.

## 2014-02-14 NOTE — Telephone Encounter (Signed)
Called, spoke with pt who reports she spoke with Glo Herring with [facility] at [phone] ext [phone] on 02/11/14 and was advised Bonnee Quin would call us on the same day.  I do not see a phone msg regarding this.  Pt requesting we call to discuss this with [facility].    Lm on Shayan's named VM TCB

## 2014-02-18 ENCOUNTER — Telehealth: Payer: Self-pay | Admitting: Internal Medicine

## 2014-02-18 NOTE — Telephone Encounter (Signed)
Pt came in office --- reports Shayan with [employer] is in need of Office notes from Nov 2 - present from office to extend pt's disability and is unsure if Bonnee Quin has contacted [employer] for this information.    Spoke with Sharyn Lull in [place].  They received a request from [facility] for office notes from Jan 09, 2014 - present yesterday.  Turn around time is 10-14 business days.  Also, request was not accompanied with a release authorization.  As pt is now in the office, pt can go to [employer] to sign this.  Spoke with pt.  Discussed above per Sharyn Lull with her.  She verbalized understanding of this and will go to [employer] today to sign release.  Regarding below msg requesting letter, pt reports she is pretty sure letter is no longer needed.  Believes [employer] only needs OV notes at this time.  Pt aware we will contact Shayan to advise of process/status of OV notes and to see if letter or anything other than office notes are still needed from our office.  Pt verbalized understanding and voiced no further questions or concerns at this time.  lmomtcb for [employer] with [employer]

## 2014-02-18 NOTE — Telephone Encounter (Signed)
LMTCB

## 2014-02-18 NOTE — Telephone Encounter (Signed)
[phone] returning cvall

## 2014-02-18 NOTE — Telephone Encounter (Signed)
Duplicate msg -- see 06/12/72 for additional information.

## 2014-02-19 NOTE — Telephone Encounter (Signed)
Lmtcb for Kelly Hale at [phone] ext. [phone]

## 2014-02-19 NOTE — Telephone Encounter (Signed)
Note closed in error. 

## 2014-02-19 NOTE — Telephone Encounter (Signed)
[name] needing a letter sent to fax # [phone] needs it for 11/02 till feb 2 to excuse her from work   [phone] is patient #

## 2014-02-19 NOTE — Telephone Encounter (Signed)
LMOM TCB x1. According to earlier in the documentation, CY had asked if pt can be scheduled for 2 month follow up

## 2014-02-20 ENCOUNTER — Telehealth: Payer: Self-pay | Admitting: Internal Medicine

## 2014-02-20 ENCOUNTER — Encounter: Payer: Self-pay | Admitting: *Deleted

## 2014-02-20 NOTE — Telephone Encounter (Signed)
Yes - thank you

## 2014-02-20 NOTE — Telephone Encounter (Signed)
Spoke with the pt  She is asking about letter LOA for her employer  There is another PN on this dated 02/11/14 that was already closed in error  According to that note, CDY wanted to have her come in in 2 months rather than 3  I have changed her 05/13/14 appt to 04/18/13  Is it okay to fax letter to her employer excusing her until 04/18/13? Fax is [phone]

## 2014-02-20 NOTE — Telephone Encounter (Signed)
Letter created, signed by CDY and faxed to the number that was provided  Pt aware  Nothing further needed

## 2014-02-24 ENCOUNTER — Telehealth: Payer: Self-pay | Admitting: Cardiovascular Disease

## 2014-02-24 MED ORDER — FUROSEMIDE 20 MG PO TABS: 20.0000 mg | ORAL_TABLET | Freq: Every day | ORAL | Status: DC

## 2014-02-24 NOTE — Telephone Encounter (Signed)
Spoke w/ pt.  Advised her that I am sending 90 day refill for furosemide to her pharmacy.  She appreciative and will call back if we can be of further assistance.

## 2014-02-24 NOTE — Telephone Encounter (Signed)
New message        Pt calling stating she needs a refill of furofemide 20mg . Please call back and advise.

## 2014-03-12 ENCOUNTER — Telehealth: Payer: Self-pay | Admitting: Internal Medicine

## 2014-03-12 NOTE — Telephone Encounter (Signed)
Ok with me 

## 2014-03-12 NOTE — Telephone Encounter (Signed)
I am ok with it, if Dr. Annamaria Boots and the patient is in agreement.

## 2014-03-12 NOTE — Telephone Encounter (Signed)
Spoke with pt, rescheduled pt's appt for 12/8 at 1:30 with VM as a consult.  Nothing further needed.

## 2014-03-12 NOTE — Telephone Encounter (Signed)
Dr. Annamaria Boots and Dr Stevenson Clinch are you both ok with this switch?  Thanks!

## 2014-03-18 ENCOUNTER — Encounter: Payer: Self-pay | Admitting: *Deleted

## 2014-03-18 ENCOUNTER — Telehealth: Payer: Self-pay | Admitting: Internal Medicine

## 2014-03-18 ENCOUNTER — Encounter: Payer: Self-pay | Admitting: Internal Medicine

## 2014-03-18 ENCOUNTER — Ambulatory Visit (INDEPENDENT_AMBULATORY_CARE_PROVIDER_SITE_OTHER): Payer: 59 | Admitting: Internal Medicine

## 2014-03-18 VITALS — BP 110/74 | HR 104 | Temp 99.1°F | Wt 259.0 lb

## 2014-03-18 DIAGNOSIS — R0683 Snoring: Secondary | ICD-10-CM

## 2014-03-18 DIAGNOSIS — J432 Centrilobular emphysema: Secondary | ICD-10-CM

## 2014-03-18 NOTE — Progress Notes (Signed)
MRN# [id number] NITIKA JACKOWSKI 11/24/1955   CC:"follow up of my copd Chief Complaint  Patient presents with  . Follow-up    Former [facility] patient switching to [employer]. She c/o sob, wheeze, chest tightness and cough.      Brief History: 12//3/12- [name] smoker followed for chronic bronchitis, tobacco use LOV- 05/06/10 Did well through the summer. Dr. Larena Glassman increased Advair to 500/50 without benefit. As cooler weather came in, she noticed increased wheeze, cough productive of white or trace yellow sputum. 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 She rates okay at night except that wheeze delays her sleep onset. Still smoking one pack per day and resists admitting that it is causing any problem. She says her husband is similar but doesn't smoke. She chokes easily with liquids. CXR- 05/06/10- bronchitis  06/17/11- [name] smoker followed for chronic bronchitis, tobacco use CXR 05/06/10 reviewed w/ her-  1. No active lung disease.  2. Peribronchial thickening consistent with bronchitis.  3. Thoracic scoliosis.  Provider: Lorina Rabon   She felt okay through this winter with no significant respiratory infection. Dislikes cold weather. Does Kelly expect a pollen problems the spring. Continues Advair 500 and uses her rescue inhaler once every 3 days. Little cough or drainage usually. Pain in right arm and right side of neck hurts enough to keep her awake. 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 We had given samples Spiriva with no effect. Continues to smoke against advice. PFT: 03/22/2011-FEV1 1.49/58%, FEV1/FVC 0.62, insignificant response to bronchodilator, RV 163% indicating air trapping, DLCO 72%. Moderate obstructive airways disease without response to bronchodilator. Air-trapping. Assessment:  COPD with bronchitis - [name], MD at 06/19/2011 8:45 PM   Status: Written Related Problem: BRONCHITIS   Chronic bronchitis  pattern in context of moderately severe COPD with poor response to bronchodilator on PFTs. We discussed this as I reviewed the PFTs. Weight loss and smoking cessation of the most important things she could do. Plan-try Brovana nebulizer treatment here     Neuropathic pain, arm - [name], MD at 06/19/2011 8:53 PM   Status: Written Related Problem: Neuropathic pain, arm   She describes pain in the right side of neck and into the right arm with numbness in the thumb and first 2 fingers of the right hand. 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 She is to speak with her primary physician about it, anticipating probable referral.  TOBACCO USER - [name], MD at 06/19/2011 8:53 PM   Status: Written Related Problem: TOBACCO USER   I tried to engage her in discussion of smoking cessation approaches. She is Kelly motivated.   ----------------------------------------------------------------------- 08/22/13- 57 yoF smoker (1ppd/ 30 pk yr) with COPD/bronchitis/asthma complicated by tobacco use [name] Cough was worse for 3 weeks but now back at baseline. Denies routine daily cough, but still some green sputum. Took Levaquin for 10 days, prednisone for 10 days ending 4 days ago. Baseline dyspnea on exertion without blood, swelling, chest pain, palpitation. Using [place]. Advair 500, nebulizer albuterol, rescue inhaler. 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 CXR 08/12/13 IMPRESSION:  No acute infiltrate or pulmonary edema. Thoracic dextroscoliosis  again noted. Central mild bronchitic changes.  Electronically Signed  By: [name] M.D.  On: 08/12/2013 13:22   12/19/13- 57  yoF smoker (1ppd/ 30 pk yr) with COPD/bronchitis/asthma complicated by tobacco use [name] FOLLOWS FOR:sob,wheeze x 1 wk.,bilat.  ankle and leg edema,cough-clear,no fcs,midchest tightness and pain She feels like asthma, coughing clear mucus, persistent midsternal soreness- Kelly radiating or pleuritic, feet swelling a lot.  On arrival here sat mid-70% on room air walking. 90% on O2 2l at rest  12/27/13- 57 yoF former smoker (1ppd/ 30 pk yr) with COPD/bronchitis/asthma complicated by tobacco use [name] Husband here FOLLOWS FOR: Pt here after hospital d/c from [place](summary pending). Pt c/o DOE, prod cough with little yellow mucus. Pt denies CP/tightness. Dx'd diastolic CHF. Discharged on O2 2L. Pt was 83% RA O2 sat upon arrival to exam room , pt took several deep breaths and increased O2 to 91% and sustained at 90-91% while at rest. 83% walking on room air. Quit smoking in September.  CXR 12/19/13 IMPRESSION:  Cardiomegaly and emphysema without acute disease.  Electronically Signed  By: [name] M.D.  On: 12/19/2013 10:09  02/10/14- 69 yoF former smoker (1ppd/ 30 pk yr) with COPD/bronchitis/asthma complicated by tobacco use, CHF [name] Husband here FOLLOWS FOR: continues to wear O2 2Lat night through [facility]; states breathing has been doing well since last visit. Would like to have a refill of cough syrup as well. Rare need for Proair. Concerned about ability to return to work unloading truck after hosp for [facility]. Sees cardiology in 2 days. Notes DOE walking house to truck.  Acute problem- pain right ear and behind angle of jaw. Kelly popping.   Events since last clinic visit: 48 yoF former smoker (1ppd/ 30 pk yr) with COPD/bronchitis/asthma complicated by tobacco use, CHF [name] Husband here FOLLOWS FOR: chronic respiratory failure, new O2 dependent, also secondary to dCHF. Continues to wear O2 2Lat night through [facility]; states breathing has been doing well since last visit, but has Kelly been wearing O2 much during the  day.  Upon arrival to the office her O2 sat was noted to be 84% on room air, requiring 2L to get >88%, then upon resting 93% on RA. Would like to have a refill of cough syrup as well. Rare need for Proair. 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 Notes DOE walking house to truck.  Saw cardiology recently (Dr. [name]) who is optimizing her dCHF with BP control and diuresis (Lasix 20mg  daily, may inc to 40mg  for 3lb wt gain or inc leg swelling).  Patient stated that she is on temporary short term disability (works at [name] in the [employer], lifts boxes up to 30lbs during her 8 hr shift), and is supposed to return to work on 03/24/14 when her shot term disability ends, however she is concerned about her ability to perform her current duties. She will require 2L O2 continuously with minimal exertion, and one 15 lb O2 tank will last 3 hours, requiring her to take 2 tanks to work and move around with it, which will be difficult given that she is mobile at work lifting and moving boxes constantly during her shift.   Obstructive Sleep Apnea Screening The patient was screened with the STOP-BANG questionnaire. >3 positive responses is considered a positive screen  Snoring  YES Tiredness  YES Observed Apnea YES Pressure (HTN) YES BMI >35  40.5 Age > 50  YES Neck >17"  NO Gender (female) FEMALE  Total: 6/8   Screen: Positive for OSA  PMHX:   Past Medical History  Diagnosis Date  . Asthma   . Hyperlipidemia   . Aspirin allergy   . Morbid obesity   . Tobacco abuse   . CHF (congestive heart failure)    Surgical Hx:  Past Surgical History  Procedure Laterality Date  . Cesarean section    . Tubal ligation    . Knee arthroscopy      right   Family Hx:  Family History  Problem Relation Age of Onset  . Other Father     MVA  . Hypertension Mother   . Hyperlipidemia Mother   . Asthma Mother   . Other Mother     heart valve replaced   . Diabetes Paternal Grandfather   . Hypertension Maternal Grandfather   . Hyperlipidemia Maternal Grandfather   . Stroke Maternal Grandfather   . Hypertension Maternal Grandmother   . Alzheimer's disease Maternal Grandmother   . Other Maternal Uncle     laryngeal cancer   Social Hx:   History  Substance Use Topics  . Smoking status: Former Smoker -- 1.00 packs/day for 30 years    Types: Cigarettes    Quit date: 12/23/2013  . Smokeless tobacco: Kelly on file  . Alcohol Use: No   Medication:   Current Outpatient Rx  Name  Route  Sig  Dispense  Refill  . albuterol (PROVENTIL) (2.5 MG/3ML) 0.083% nebulizer solution   Nebulization   Take 3 mLs (2.5 mg total) by nebulization every 6 (six) hours as needed for wheezing or shortness of breath.   100 vial   11   . amLODipine (NORVASC) 5 MG tablet   Oral   Take 1 tablet by mouth daily.         Marland Kitchen amoxicillin-clavulanate (AUGMENTIN) 875-125 MG per tablet   Oral   Take 1 tablet by mouth 2 (two) times daily.   20 tablet   0   . Fluticasone-Salmeterol (ADVAIR) 500-50 MCG/DOSE AEPB   Inhalation   Inhale 1 puff into the lungs every 12 (twelve) hours.         . furosemide (LASIX) 20 MG tablet   Oral   Take 1 tablet (20 mg total) by mouth daily.   90 tablet   3   . HYDROcodone-homatropine (HYCODAN) 5-1.5 MG/5ML syrup      5 ml every 6 hours if needed for cough   200 mL   0   . Multiple Vitamin (MULTIVITAMIN) capsule   Oral   Take 1 capsule by mouth daily.         . OXYGEN   Inhalation   Inhale into the lungs. 2 liters at bedtime.         Marland Kitchen PROAIR HFA 108 (90 BASE) MCG/ACT inhaler      INHALE 2 PUFFS BY MOUTH 4 TIMES A DAY AS NEEDED   25.5 each   0   . Vitamin D, Ergocalciferol, (DRISDOL) 50000 UNITS CAPS capsule   Oral   Take 1 capsule by mouth once a week.         . nicotine (NICODERM CQ - DOSED IN MG/24 HOURS) 21 mg/24hr patch   Transdermal   Place 1 patch (21 mg total) onto the skin daily.   28  patch   1      Review of Systems: Gen:  Denies  fever, sweats, chills HEENT: Denies blurred vision, double vision, ear pain, eye pain, hearing loss, nose bleeds, sore throat Cvc:  No dizziness, chest  pain or heaviness Resp:   Denies cough or sputum porduction, shortness of breath Gi: Denies swallowing difficulty, stomach pain, nausea or vomiting, diarrhea, constipation, bowel incontinence Gu:  Denies bladder incontinence, burning urine Ext:   No Joint pain, stiffness or swelling Skin: No skin rash, easy bruising or bleeding or hives Endoc:  No polyuria, polydipsia , polyphagia or weight change Psych: No depression, insomnia or hallucinations  Other:  All other systems negative  Allergies:  Aspirin  Physical Examination:  VS: BP 110/74 mmHg  Pulse 104  Temp(Src) 99.1 F (37.3 C) (Oral)  Wt 259 lb (117.482 kg)  SpO2 84% (walked from car to office without O2), increased to 93% with rest  General Appearance: No distress  Neuro: EXAM: without focal findings, mental status, speech normal, alert and oriented, cranial nerves 2-12 grossly normal  HEENT: PERRLA, EOM intact, no ptosis, no other lesions noticed Pulmonary:Exam: normal breath sounds., diaphragmatic excursion normal.No wheezing, No rales   Cardiovascular:@ Exam:  Normal S1,S2.  No m/r/g.     Abdomen:Exam: Benign, Soft, non-tender, No masses  Skin:   warm, no rashes, no ecchymosis  Extremities: normal, no cyanosis, clubbing, no edema, warm with normal capillary refill.   Labs results:  BMP No results found for: NA, K, CL, CO2, GLUCOSE, BUN, CREATININE   CBC CBC Latest Ref Rng 12/19/2013 09/14/2006  WBC 4.0 - 10.5 K/uL 8.8 -  Hemoglobin 12.0 - 15.0 g/dL 17.1(H) 16.1(H)  Hematocrit 36.0 - 46.0 % 55.3 Repeated and verified X2.(H) -  Platelets 150.0 - 400.0 K/uL 275.0 -     Assessment and Plan: COPD with emphysema Acute on chronic hypoxic respiratory failure from combined COPD w emphysema and diastolic CHF dx'd at  [facility].  Plan-  - O2 2L with minimal exertion and with sleep - check split night study - she is requiring supplemental O2 with very minimal exertion up to 2L liters - with her current job situation she will definitely need continuous O2 while at work, and will have limited activities (15lb lifting limit, access to O2, work up to 6 hrs) - referral to pulmonary rehab - concern for overlap syndrome (COPD + OSA), will check split night study.     Snoring Patient with positive OSA screening test. Will plan for split night study.   Discussed the following with the patient: -Encouraged proper weight management.  -Excessive weight may contribute to snoring.  -Monitor sedative use.  -Discussed driving precautions and its relationship with hypersomnolence.  -Discussed operating dangerous equipment and its relationship with hypersomnolence.  -Discussed sleep hygiene, and benefits of a fixed sleep waked time.  -The importance of getting eight or more hours of sleep discussed with patient.  -Discussed limiting the use of the computer and television before bedtime.  -Decrease naps during the day, so night time sleep will become enhanced.  -Limit caffeine, and sleep deprivation.     Morbid obesity OBESITY  Wt: 259lbs BMI: 41 Discussed importance of weight reduction.  Educated regarding limitation of  intake of greasy/fried foods.  Instructed on benefit of  a low-impact exercise program, starting slowly.  Discussed benefits of 30-45 minutes of some form of exercise daily as well as benefit of supervised exercise program.       Updated Medication List Outpatient Encounter Prescriptions as of 03/18/2014  Medication Sig  . albuterol (PROVENTIL) (2.5 MG/3ML) 0.083% nebulizer solution Take 3 mLs (2.5 mg total) by nebulization every 6 (six) hours as needed for wheezing or shortness of breath.  Marland Kitchen  amLODipine (NORVASC) 5 MG tablet Take 1 tablet by mouth daily.  Marland Kitchen  amoxicillin-clavulanate (AUGMENTIN) 875-125 MG per tablet Take 1 tablet by mouth 2 (two) times daily.  . Fluticasone-Salmeterol (ADVAIR) 500-50 MCG/DOSE AEPB Inhale 1 puff into the lungs every 12 (twelve) hours.  . furosemide (LASIX) 20 MG tablet Take 1 tablet (20 mg total) by mouth daily.  Marland Kitchen HYDROcodone-homatropine (HYCODAN) 5-1.5 MG/5ML syrup 5 ml every 6 hours if needed for cough  . Multiple Vitamin (MULTIVITAMIN) capsule Take 1 capsule by mouth daily.  . OXYGEN Inhale into the lungs. 2 liters at bedtime.  Marland Kitchen PROAIR HFA 108 (90 BASE) MCG/ACT inhaler INHALE 2 PUFFS BY MOUTH 4 TIMES A DAY AS NEEDED  . Vitamin D, Ergocalciferol, (DRISDOL) 50000 UNITS CAPS capsule Take 1 capsule by mouth once a week.  . [DISCONTINUED] nicotine (NICODERM CQ - DOSED IN MG/24 HOURS) 21 mg/24hr patch Place 21 mg onto the skin daily.     Orders for this visit: Orders Placed This Encounter  Procedures  . Split night study    Standing Status: Future     Number of Occurrences:      Standing Expiration Date: 03/19/2015    Order Specific Question:  Where should this test be performed:    Answer:  [place]    Thank  you for the visitation and for allowing  [facility] to assist in the care of your patient. Our recommendations are noted above.  Please contact us if we can be of further service.  Vilinda Boehringer, MD [facility] Office Number: [phone]  Plan  COPD - cont inhalers - wear o2 with especially with minimal exertion  Snoring - split study  Tobacco use - cessation counseling Quit date 04/11/14

## 2014-03-18 NOTE — Patient Instructions (Signed)
We will have a Split Night Sleep Study done at [facility]. Follow up with Dr. Stevenson Clinch in 1 month.

## 2014-03-18 NOTE — Telephone Encounter (Signed)
Letter given to patient today during office for missing work for visit. The letter is stating that she may return to work under certain restrictions but does not say when she can return to work. Pt states that it was discussed for her to return to work Monday 03/24/14.    "To Whom it May Concern:  Kelly Hale was seen in my clinic on 03/18/2014. She may return to work under the following restrictions: She must have access to her oxygen supply at all time. She must be able to use oxygen with exhertion. She must not lift anything over 15 lbs. She can only work 6 hours per day due to oxygen availability..."  Please advise Dr Stevenson Clinch if there is a specific date that the patient needs to return to work by. Thanks

## 2014-03-19 ENCOUNTER — Encounter: Payer: Self-pay | Admitting: Internal Medicine

## 2014-03-19 ENCOUNTER — Encounter: Payer: Self-pay | Admitting: *Deleted

## 2014-03-19 ENCOUNTER — Other Ambulatory Visit: Payer: Self-pay | Admitting: Internal Medicine

## 2014-03-19 ENCOUNTER — Other Ambulatory Visit: Payer: Self-pay | Admitting: *Deleted

## 2014-03-19 DIAGNOSIS — F17200 Nicotine dependence, unspecified, uncomplicated: Secondary | ICD-10-CM

## 2014-03-19 DIAGNOSIS — J432 Centrilobular emphysema: Secondary | ICD-10-CM

## 2014-03-19 MED ORDER — NICOTINE 21 MG/24HR TD PT24: 21.0000 mg | MEDICATED_PATCH | Freq: Every day | TRANSDERMAL | Status: DC

## 2014-03-19 NOTE — Assessment & Plan Note (Signed)
OBESITY  Wt: 259lbs BMI: 41 Discussed importance of weight reduction.  Educated regarding limitation of  intake of greasy/fried foods.  Instructed on benefit of  a low-impact exercise program, starting slowly.  Discussed benefits of 30-45 minutes of some form of exercise daily as well as benefit of supervised exercise program.      

## 2014-03-19 NOTE — Assessment & Plan Note (Signed)
Acute on chronic hypoxic respiratory failure from combined COPD w emphysema and diastolic CHF dx'd at [facility].  Plan-  - O2 2L with minimal exertion and with sleep - check split night study - she is requiring supplemental O2 with very minimal exertion up to 2L liters - with her current job situation she will definitely need continuous O2 while at work, and will have limited activities (15lb lifting limit, access to O2, work up to 6 hrs) - referral to pulmonary rehab - concern for overlap syndrome (COPD + OSA), will check split night study.

## 2014-03-19 NOTE — Assessment & Plan Note (Signed)
Patient with positive OSA screening test. Will plan for split night study.   Discussed the following with the patient: -Encouraged proper weight management.  -Excessive weight may contribute to snoring.  -Monitor sedative use.  -Discussed driving precautions and its relationship with hypersomnolence.  -Discussed operating dangerous equipment and its relationship with hypersomnolence.  -Discussed sleep hygiene, and benefits of a fixed sleep waked time.  -The importance of getting eight or more hours of sleep discussed with patient.  -Discussed limiting the use of the computer and television before bedtime.  -Decrease naps during the day, so night time sleep will become enhanced.  -Limit caffeine, and sleep deprivation.

## 2014-03-20 NOTE — Telephone Encounter (Signed)
Spoke with pt...does not need updated letter at this time. Disability has accepted the verbal return date per patient of 03/24/14. Pt aware that signed letter will be held by nurse for a little while and then will be shredded. Nothing further needed.

## 2014-04-01 ENCOUNTER — Telehealth: Payer: Self-pay | Admitting: Internal Medicine

## 2014-04-01 NOTE — Telephone Encounter (Signed)
Spoke with Dr. Stevenson Clinch in regards to patient restrictions. He says that patient will have these restriction for 3 months. She will be completing pulmonary rehab  be re-evaluated. Left message for Glenard Haring to return call.

## 2014-04-02 NOTE — Telephone Encounter (Signed)
Angel advised. [place] Bing, CMA

## 2014-04-07 ENCOUNTER — Telehealth: Payer: Self-pay | Admitting: Internal Medicine

## 2014-04-07 NOTE — Telephone Encounter (Signed)
Called and spoke with pt and she is aware of VM recs.  Nothing further is needed at this time.

## 2014-04-07 NOTE — Telephone Encounter (Signed)
Called and spoke with pt and she stated that HR at her job told her that there is no work there for her to do.   She stated that she wanted to see what VM says about this.  She stated that she was going to apply for disability since she has been doing this job for 25 years and this is all she knows.  VM  Please advise. Thanks  Allergies  Allergen Reactions  . Aspirin     REACTION: intol-asthma    Current Outpatient Prescriptions on File Prior to Visit  Medication Sig Dispense Refill  . albuterol (PROVENTIL) (2.5 MG/3ML) 0.083% nebulizer solution Take 3 mLs (2.5 mg total) by nebulization every 6 (six) hours as needed for wheezing or shortness of breath. 100 vial 11  . amLODipine (NORVASC) 5 MG tablet Take 1 tablet by mouth daily.    Marland Kitchen amoxicillin-clavulanate (AUGMENTIN) 875-125 MG per tablet Take 1 tablet by mouth 2 (two) times daily. 20 tablet 0  . Fluticasone-Salmeterol (ADVAIR) 500-50 MCG/DOSE AEPB Inhale 1 puff into the lungs every 12 (twelve) hours.    . furosemide (LASIX) 20 MG tablet Take 1 tablet (20 mg total) by mouth daily. 90 tablet 3  . HYDROcodone-homatropine (HYCODAN) 5-1.5 MG/5ML syrup 5 ml every 6 hours if needed for cough 200 mL 0  . Multiple Vitamin (MULTIVITAMIN) capsule Take 1 capsule by mouth daily.    . nicotine (NICODERM CQ - DOSED IN MG/24 HOURS) 21 mg/24hr patch Place 1 patch (21 mg total) onto the skin daily. 28 patch 1  . OXYGEN Inhale into the lungs. 2 liters at bedtime.    Marland Kitchen PROAIR HFA 108 (90 BASE) MCG/ACT inhaler INHALE 2 PUFFS BY MOUTH 4 TIMES A DAY AS NEEDED 25.5 each 0  . Vitamin D, Ergocalciferol, (DRISDOL) 50000 UNITS CAPS capsule Take 1 capsule by mouth once a week.     No current facility-administered medications on file prior to visit.

## 2014-04-07 NOTE — Telephone Encounter (Signed)
I do not have much to offer in terms of her job situation. She has limitations due to her COPD and dCHF, these limitations maybe temporary or improve with therapy and Pulmonary rehab; I cannot guarantee that either. If she applies for disability, then she will have to see a disability physician (I think that is handle by the state and/or her insurance along with current employer).

## 2014-04-11 ENCOUNTER — Ambulatory Visit: Payer: Self-pay | Admitting: Internal Medicine

## 2014-04-18 ENCOUNTER — Ambulatory Visit: Payer: 59 | Admitting: Internal Medicine

## 2014-05-06 ENCOUNTER — Ambulatory Visit (INDEPENDENT_AMBULATORY_CARE_PROVIDER_SITE_OTHER): Payer: 59 | Admitting: Internal Medicine

## 2014-05-06 ENCOUNTER — Encounter (INDEPENDENT_AMBULATORY_CARE_PROVIDER_SITE_OTHER): Payer: Self-pay

## 2014-05-06 ENCOUNTER — Encounter: Payer: Self-pay | Admitting: Internal Medicine

## 2014-05-06 VITALS — BP 120/80 | HR 99 | Ht 67.0 in | Wt 259.0 lb

## 2014-05-06 DIAGNOSIS — G4733 Obstructive sleep apnea (adult) (pediatric): Secondary | ICD-10-CM | POA: Insufficient documentation

## 2014-05-06 DIAGNOSIS — Z9989 Dependence on other enabling machines and devices: Secondary | ICD-10-CM

## 2014-05-06 DIAGNOSIS — Z720 Tobacco use: Secondary | ICD-10-CM | POA: Diagnosis not present

## 2014-05-06 DIAGNOSIS — J432 Centrilobular emphysema: Secondary | ICD-10-CM

## 2014-05-06 DIAGNOSIS — F17200 Nicotine dependence, unspecified, uncomplicated: Secondary | ICD-10-CM

## 2014-05-06 NOTE — Progress Notes (Signed)
MRN# [id number] Kelly Hale 06/21/1955   CC: Chief Complaint  Patient presents with  . Follow-up    pt here f/u sleep study. She is still smoking she is wearing the patch. She has cut back. Pt reports breathing unchanged since last visit.      Brief History: 12//3/12- [name] smoker followed for chronic bronchitis, tobacco use LOV- 05/06/10 Did well through the summer. Dr. Larena Glassman increased Advair to 500/50 without benefit. As cooler weather came in, she noticed increased wheeze, cough productive of white or trace yellow sputum. 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 She rates okay at night except that wheeze delays her sleep onset. Still smoking one pack per day and resists admitting that it is causing any problem. She says her husband is similar but doesn't smoke. She chokes easily with liquids. CXR- 05/06/10- bronchitis  06/17/11- [name] smoker followed for chronic bronchitis, tobacco use CXR 05/06/10 reviewed w/ her-  1. No active lung disease.  2. Peribronchial thickening consistent with bronchitis.  3. Thoracic scoliosis.  Provider: Lorina Hale   She felt okay through this winter with no significant respiratory infection. Dislikes cold weather. Does not expect a pollen problems the spring. Continues Advair 500 and uses her rescue inhaler once every 3 days. Little cough or drainage usually. Pain in right arm and right side of neck hurts enough to keep her awake. 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 We had given samples Spiriva with no effect. Continues to smoke against advice. PFT: 03/22/2011-FEV1 1.49/58%, FEV1/FVC 0.62, insignificant response to bronchodilator, RV 163% indicating air trapping, DLCO 72%. Moderate obstructive airways disease without response to bronchodilator. Air-trapping. Assessment:  COPD with bronchitis - Kelly Lever, MD at 06/19/2011 8:45 PM   Status: Written Related Problem:  BRONCHITIS   Chronic bronchitis pattern in context of moderately severe COPD with poor response to bronchodilator on PFTs. We discussed this as I reviewed the PFTs. Weight loss and smoking cessation of the most important things she could do. Plan-try Brovana nebulizer treatment here     Neuropathic pain, arm - Kelly Lever, MD at 06/19/2011 8:53 PM   Status: Written Related Problem: Neuropathic pain, arm   She describes pain in the right side of neck and into the right arm with numbness in the thumb and first 2 fingers of the right hand. 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 Chest x-ray does not show a Pancoast tumor. She is to speak with her primary physician about it, anticipating probable referral.  TOBACCO USER - Kelly Lever, MD at 06/19/2011 8:53 PM   Status: Written Related Problem: TOBACCO USER   I tried to engage her in discussion of smoking cessation approaches. She is not motivated.   ----------------------------------------------------------------------- 08/22/13- 57 yoF smoker (1ppd/ 30 pk yr) with COPD/bronchitis/asthma complicated by tobacco use Mother is Kelly Hale Cough was worse for 3 weeks but now back at baseline. Denies routine daily cough, but still some green sputum. Took Levaquin for 10 days, prednisone for 10 days ending 4 days ago. Baseline dyspnea on exertion without blood, swelling, chest pain, palpitation. Using [place]. Advair 500, nebulizer albuterol, rescue inhaler. 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 CXR 08/12/13 IMPRESSION:  No acute infiltrate or pulmonary edema. Thoracic dextroscoliosis  again noted. Central mild bronchitic changes.  Electronically Signed  By: Kelly Hale.D.  On: 08/12/2013 13:22   12/19/13- 63 yoF smoker (1ppd/ 30 pk yr) with COPD/bronchitis/asthma complicated by tobacco use Mother is Kelly Hale FOLLOWS FOR:sob,wheeze x 1 wk.,bilat. ankle and leg edema,cough-clear,no fcs,midchest tightness and pain She feels like asthma, coughing clear mucus, persistent midsternal soreness- not radiating or pleuritic, feet swelling a lot.  On arrival here sat mid-70% on room air walking. 90% on O2 2l at rest  12/27/13- 57 yoF former smoker (1ppd/ 30 pk yr) with COPD/bronchitis/asthma complicated by tobacco use Mother is Kelly Hale Husband here FOLLOWS FOR: Pt here after hospital d/c from [place](summary pending). Pt c/o DOE, prod cough with little yellow mucus. Pt denies CP/tightness. Dx'd diastolic CHF. Discharged on O2 2L. Pt was 83% RA O2 sat upon arrival to exam room , pt took several deep breaths and increased O2 to 91% and sustained at 90-91% while at rest. 83% walking on room air. Quit smoking in September.  CXR 12/19/13 IMPRESSION:  Cardiomegaly and emphysema without acute disease.  Electronically Signed  By: Kelly Hale M.D.  On: 12/19/2013 10:09  02/10/14- 63 yoF former smoker (1ppd/ 30 pk yr) with COPD/bronchitis/asthma complicated by tobacco use, CHF Mother is Kelly Hale Husband here FOLLOWS FOR: continues to wear O2 2Lat night through [facility]; states breathing has been doing well since last visit. Would like to have a refill of cough syrup as well. Rare need for Proair. Concerned about ability to return to work unloading truck after hosp for [facility]. Sees cardiology in 2 days. Notes DOE walking house to truck.  Acute problem- pain right ear and behind angle of jaw. Not popping.   ROV 03/18/14 38 yoF former smoker (1ppd/ 30 pk yr) with COPD/bronchitis/asthma complicated by tobacco use, CHF Mother is Kelly Hale Husband here FOLLOWS FOR: chronic respiratory failure, new O2 dependent, also secondary to dCHF. Continues to wear O2 2Lat night through [facility]; states breathing has been doing well since last visit, but has not been  wearing O2 much during the day. Upon arrival to the office her O2 sat was noted to be 84% on room air, requiring 2L to get >88%, then upon resting 93% on RA. Would like to have a refill of cough syrup as well. Rare need for Proair. 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 Notes DOE walking house to truck.  Saw cardiology recently (Dr. [name]) who is optimizing her dCHF with BP control and diuresis (Lasix 20mg  daily, may inc to 40mg  for 3lb wt gain or inc leg swelling).  Patient stated that she is on temporary short term disability (works at [name] in the [employer], lifts boxes up to 30lbs during her 8 hr shift), and is supposed to return to work on 03/24/14 when her shot term disability ends, however she is concerned about her ability to perform her current duties. She will require 2L O2 continuously with minimal exertion, and one 15 lb O2 tank will last 3 hours, requiring her to take 2 tanks to work and move around with it, which will be difficult given that she is mobile at work lifting and moving boxes constantly during her shift.  Plan - COPD - on 2L with exertion, advair\prn albuterol, pulm rehab, check split night study  Events since last clinic visit: Presents today for a follow up visit of her COPD. Since her last visit she has retired from [employer], and will start a retirement plan shortly.  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  Currently, she has not been wearing her O2 with exertion, only at night, she thought at is what she was supposed to do. Currently she is still smoking 2-4 cigs per day, only wearing nicotine patch 5/7 days Since last visit no ED, urgent or hospitalization for COPD\dyspnea.  Following with cardiology for [facility]  Today upon checkin noted to desat from car to office, saturation was 87%, promptly return to  >90% at rest. She has not been to pulm rehab yet, awaiting referral or appointments.  Today would like to discuss the results of her split night study.    PMHX:   Past Medical History  Diagnosis Date  . Asthma   . Hyperlipidemia   . Aspirin allergy   . Morbid obesity   . Tobacco abuse   . CHF (congestive heart failure)    Surgical Hx:  Past Surgical History  Procedure Laterality Date  . Cesarean section    . Tubal ligation    . Knee arthroscopy      right   Family Hx:  Family History  Problem Relation Age of Onset  . Other Father     MVA  . Hypertension Mother   . Hyperlipidemia Mother   . Asthma Mother   . Other Mother     heart valve replaced  . Diabetes Paternal Grandfather   . Hypertension Maternal Grandfather   . Hyperlipidemia Maternal Grandfather   . Stroke Maternal Grandfather   . Hypertension Maternal Grandmother   . Alzheimer's disease Maternal Grandmother   . Other Maternal Uncle     laryngeal cancer   Social Hx:   History  Substance Use Topics  . Smoking status: Current Every Day Smoker -- 1.00 packs/day for 30 years    Types: Cigarettes  . Smokeless tobacco: Never Used     Comment: quit in Sept 2015, but started back smoking up to 3 cigs per day  . Alcohol Use: No   Medication:   Current Outpatient Rx  Name  Route  Sig  Dispense  Refill  . albuterol (PROVENTIL) (2.5 MG/3ML) 0.083% nebulizer solution   Nebulization   Take 3 mLs (2.5 mg total) by nebulization every 6 (six) hours as needed for wheezing or shortness of breath.   100 vial   11   . amLODipine (NORVASC) 5 MG tablet   Oral   Take 1 tablet by mouth daily.         . Fluticasone-Salmeterol (ADVAIR) 500-50 MCG/DOSE AEPB   Inhalation   Inhale 1 puff into the lungs every 12 (twelve) hours.         . furosemide (LASIX) 20 MG tablet   Oral   Take 1 tablet (20 mg total) by mouth daily.   90 tablet   3   . HYDROcodone-homatropine (HYCODAN) 5-1.5 MG/5ML syrup      5 ml every 6  hours if needed for cough   200 mL   0   . Multiple Vitamin (MULTIVITAMIN) capsule   Oral   Take 1 capsule by mouth daily.         . nicotine (NICODERM CQ - DOSED IN MG/24 HOURS) 21 mg/24hr patch   Transdermal   Place 1 patch (21 mg total) onto the skin daily.   28 patch   1   . OXYGEN   Inhalation   Inhale into the lungs. 2 liters at bedtime.         Marland Kitchen  PROAIR HFA 108 (90 BASE) MCG/ACT inhaler      INHALE 2 PUFFS BY MOUTH 4 TIMES A DAY AS NEEDED   25.5 each   0   . Vitamin D, Ergocalciferol, (DRISDOL) 50000 UNITS CAPS capsule   Oral   Take 1 capsule by mouth once a week.            Review of Systems: Gen:  Denies  fever, sweats, chills HEENT: Denies blurred vision, double vision, ear pain, eye pain, hearing loss, nose bleeds, sore throat Cvc:  No dizziness, chest pain or heaviness Resp:   Sob(chronic) Gi: Denies swallowing difficulty, stomach pain, nausea or vomiting, diarrhea, constipation, bowel incontinence Gu:  Denies bladder incontinence, burning urine Ext:   No Joint pain, stiffness or swelling Skin: No skin rash, easy bruising or bleeding or hives Endoc:  No polyuria, polydipsia , polyphagia or weight change Psych: No depression, insomnia or hallucinations  Other:  All other systems negative  Allergies:  Aspirin  Physical Examination:  VS: BP 120/80 mmHg  Pulse 99  Ht 5\' 7"  (1.702 m)  Wt 259 lb (117.482 kg)  BMI 40.56 kg/m2  SpO2 89%  General Appearance: No distress  HEENT: PERRLA, EOM intact, no ptosis, no other lesions noticed Pulmonary:Exam: good respiratory effort, mild fine expiratory wheezes at the bases, no crackles, no rales. Cardiovascular:@ Exam:  Normal S1,S2.  No m/r/g.     Abdomen:Exam: Benign, Soft, non-tender, No masses  Skin:   warm, no rashes, no ecchymosis  Extremities: normal, no cyanosis, clubbing, no edema, warm with normal capillary refill.   Labs results:  BMP No results found for: NA, K, CL, CO2, GLUCOSE, BUN,  CREATININE   CBC CBC Latest Ref Rng 12/19/2013 09/14/2006  WBC 4.0 - 10.5 K/uL 8.8 -  Hemoglobin 12.0 - 15.0 g/dL 17.1(H) 16.1(H)  Hematocrit 36.0 - 46.0 % 55.3 Repeated and verified X2.(H) -  Platelets 150.0 - 400.0 K/uL 275.0 -       Assessment and Plan: COPD with emphysema Acute on chronic hypoxic respiratory failure from combined COPD w emphysema and diastolic CHF  Plan-  - O2 2L with minimal exertion and with sleep - split night study with severe OSA (AHI=63.8) - cpap 16cm H2O - she is requiring supplemental O2 with very minimal exertion up to 2L liters - retired from current job.  - referral to pulmonary rehab - overlap syndrome (COPD + OSA) - discussed in detailed with patient - tobacco cessation, quit date 06/10/2014 - cont with nicotine patches - wear daily        OSA on CPAP OSA - Very Severe, AHI=63.8, 16cmH2O, 2L O2 bleed in at night\with sleep. Discussed sleep data and reviewed with patient.  Encouraged proper weight management.  Excessive weight may contribute to snoring.  Monitor sedative use.  Discussed driving precautions and its relationship with hypersomnolence.  Discussed operating dangerous equipment and its relationship with hypersomnolence.  Discussed sleep hygiene, and benefits of a fixed sleep waked time.  The importance of getting eight or more hours of sleep discussed with patient.  Discussed limiting the use of the computer and television before bedtime.  Decrease naps during the day, so night time sleep will become enhanced.  Limit caffeine, and sleep deprivation.  HTN, stroke, and heart failure are potential risk factors.     Plan: Follow up in 6weeks for C-PAP compliance, unless other concerns, issues, or needs develop, then call our office for an appointment.       Morbid obesity  OBESITY  Wt: 259lbs BMI: 41 Discussed importance of weight reduction.  Educated regarding limitation of  intake of greasy/fried foods.  Instructed on  benefit of  a low-impact exercise program, starting slowly.  Discussed benefits of 30-45 minutes of some form of exercise daily as well as benefit of supervised exercise program.        TOBACCO USER Tobacco Cessation - Counseling regarding benefits of smoking cessation strategies was provided for more than 12 min. - Educated that at this time smoking- cessation represents the single most important step that patient can take to enhance the length and quality of live. - Educated patient regarding alternatives of behavior interventions, pharmacotherapy including NRT and non-nicotine therapy such, and combinations of both. - Patient at this time: patient is using nicotine patches 5/7 days, recommend using full 7 days, quit set to 06/10/2014      Updated Medication List Outpatient Encounter Prescriptions as of 05/06/2014  Medication Sig  . albuterol (PROVENTIL) (2.5 MG/3ML) 0.083% nebulizer solution Take 3 mLs (2.5 mg total) by nebulization every 6 (six) hours as needed for wheezing or shortness of breath.  Marland Kitchen amLODipine (NORVASC) 5 MG tablet Take 1 tablet by mouth daily.  . Fluticasone-Salmeterol (ADVAIR) 500-50 MCG/DOSE AEPB Inhale 1 puff into the lungs every 12 (twelve) hours.  . furosemide (LASIX) 20 MG tablet Take 1 tablet (20 mg total) by mouth daily.  Marland Kitchen HYDROcodone-homatropine (HYCODAN) 5-1.5 MG/5ML syrup 5 ml every 6 hours if needed for cough  . Multiple Vitamin (MULTIVITAMIN) capsule Take 1 capsule by mouth daily.  . nicotine (NICODERM CQ - DOSED IN MG/24 HOURS) 21 mg/24hr patch Place 1 patch (21 mg total) onto the skin daily.  . OXYGEN Inhale into the lungs. 2 liters at bedtime.  Marland Kitchen PROAIR HFA 108 (90 BASE) MCG/ACT inhaler INHALE 2 PUFFS BY MOUTH 4 TIMES A DAY AS NEEDED  . Vitamin D, Ergocalciferol, (DRISDOL) 50000 UNITS CAPS capsule Take 1 capsule by mouth once a week.  . [DISCONTINUED] amoxicillin-clavulanate (AUGMENTIN) 875-125 MG per tablet Take 1 tablet by mouth 2 (two) times  daily. (Patient not taking: Reported on 05/06/2014)    Orders for this visit: Orders Placed This Encounter  Procedures  . AMB referral to rehabilitation    Referral Priority:  Routine    Referral Type:  Consultation    Number of Visits Requested:  1    Thank  you for the visitation and for allowing  [facility] to assist in the care of your patient. Our recommendations are noted above.  Please contact us if we can be of further service.  Vilinda Boehringer, MD [facility] Office Number: [phone]

## 2014-05-06 NOTE — Assessment & Plan Note (Signed)
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  - referral to pulmonary rehab - overlap syndrome (COPD + OSA) - discussed in detailed with patient - tobacco cessation, quit date 06/10/2014 - cont with nicotine patches - wear daily

## 2014-05-06 NOTE — Assessment & Plan Note (Addendum)
OSA - Very Severe, AHI=63.8, 16cmH2O, 2L O2 bleed in at night\with sleep. Discussed sleep data and reviewed with patient.  Encouraged proper weight management.  Excessive weight may contribute to snoring.  Monitor sedative use.  Discussed driving precautions and its relationship with hypersomnolence.  Discussed operating dangerous equipment and its relationship with hypersomnolence.  Discussed sleep hygiene, and benefits of a fixed sleep waked time.  The importance of getting eight or more hours of sleep discussed with patient.  Discussed limiting the use of the computer and television before bedtime.  Decrease naps during the day, so night time sleep will become enhanced.  Limit caffeine, and sleep deprivation.  HTN, stroke, and heart failure are potential risk factors.     Plan: Follow up in 6weeks for C-PAP compliance, unless other concerns, issues, or needs develop, then call our office for an appointment.

## 2014-05-06 NOTE — Patient Instructions (Addendum)
Follow up with Dr. Stevenson Clinch in 6 weeks  COPD - cont with advair (rinse and gargle after each use) - con with proair/albuterol (resuce inhaler) as needed for sob\wheezing\cough spells - start Spiriva 1 capsule via inhalation daily - wear 2L of oxygen daily with minimal exertion (house chores, walking to car\mailbox, etc) - you do NOT need oxygen at rest - Pulmonary office will make referral for pulmonary rehab  - you have a mild expiratory wheeze at the bases of your lungs.    Obstructive Sleep Apnea - very severe type, AHI=63.8 - you will require CPAP 16cmH20 with 2L of O2 at night - wear your cpap every night, minimum 4 hours  Tobacco Use - quit date 06/10/14 - cont with nicotine patch (alternate sites daily)   Follow up in 6 weeks - please bring cpap compliance report to follow up visit.

## 2014-05-06 NOTE — Assessment & Plan Note (Signed)
OBESITY  Wt: 259lbs BMI: 41 Discussed importance of weight reduction.  Educated regarding limitation of  intake of greasy/fried foods.  Instructed on benefit of  a low-impact exercise program, starting slowly.  Discussed benefits of 30-45 minutes of some form of exercise daily as well as benefit of supervised exercise program.

## 2014-05-06 NOTE — Assessment & Plan Note (Signed)
Tobacco Cessation - Counseling regarding benefits of smoking cessation strategies was provided for more than 12 min. - Educated that at this time smoking- cessation represents the single most important step that patient can take to enhance the length and quality of live. - Educated patient regarding alternatives of behavior interventions, pharmacotherapy including NRT and non-nicotine therapy such, and combinations of both. - Patient at this time: patient is using nicotine patches 5/7 days, recommend using full 7 days, quit set to 06/10/2014

## 2014-05-08 ENCOUNTER — Other Ambulatory Visit: Payer: Self-pay | Admitting: *Deleted

## 2014-05-08 DIAGNOSIS — R0683 Snoring: Secondary | ICD-10-CM

## 2014-05-09 ENCOUNTER — Telehealth: Payer: Self-pay

## 2014-05-09 NOTE — Telephone Encounter (Signed)
Request from [place] , sent to [place] on 05/12/2014 .

## 2014-05-12 ENCOUNTER — Telehealth: Payer: Self-pay

## 2014-05-12 NOTE — Telephone Encounter (Signed)
Request from [place] , sent to [place] on 05/12/2014 .

## 2014-05-13 ENCOUNTER — Ambulatory Visit: Payer: 59 | Admitting: Internal Medicine

## 2014-05-21 ENCOUNTER — Telehealth: Payer: Self-pay | Admitting: Internal Medicine

## 2014-05-21 DIAGNOSIS — G4733 Obstructive sleep apnea (adult) (pediatric): Secondary | ICD-10-CM

## 2014-05-21 NOTE — Telephone Encounter (Signed)
Spoke with pt . Order was never placed for CPAP. This has been done and marked urgent. Nothing further was needed.

## 2014-05-22 ENCOUNTER — Telehealth: Payer: Self-pay | Admitting: *Deleted

## 2014-05-22 NOTE — Telephone Encounter (Signed)
Request from [place] , sent to [place] on 05/22/14 .

## 2014-06-09 ENCOUNTER — Encounter: Payer: Self-pay | Admitting: Internal Medicine

## 2014-06-12 ENCOUNTER — Ambulatory Visit (INDEPENDENT_AMBULATORY_CARE_PROVIDER_SITE_OTHER): Payer: Self-pay | Admitting: Internal Medicine

## 2014-06-16 ENCOUNTER — Encounter: Payer: Self-pay | Admitting: Internal Medicine

## 2014-06-16 ENCOUNTER — Telehealth: Payer: Self-pay | Admitting: Internal Medicine

## 2014-06-16 ENCOUNTER — Ambulatory Visit (INDEPENDENT_AMBULATORY_CARE_PROVIDER_SITE_OTHER): Payer: 59 | Admitting: Internal Medicine

## 2014-06-16 VITALS — BP 132/88 | HR 107 | Temp 98.8°F | Ht 67.0 in | Wt 282.6 lb

## 2014-06-16 DIAGNOSIS — Z9989 Dependence on other enabling machines and devices: Principal | ICD-10-CM

## 2014-06-16 DIAGNOSIS — F17200 Nicotine dependence, unspecified, uncomplicated: Secondary | ICD-10-CM

## 2014-06-16 DIAGNOSIS — J432 Centrilobular emphysema: Secondary | ICD-10-CM

## 2014-06-16 DIAGNOSIS — G4733 Obstructive sleep apnea (adult) (pediatric): Secondary | ICD-10-CM

## 2014-06-16 DIAGNOSIS — Z720 Tobacco use: Secondary | ICD-10-CM

## 2014-06-16 NOTE — Patient Instructions (Addendum)
Follow up with Dr. Stevenson Clinch in 3 months - continue with CPAP every night and 2L oxygen every night - even with travel take your cpap machine with you.  - stop smoking, new quit date - 07/11/14 - continue with advair - 1 puff in the morning and 1 puff in the evening - rinse and gargle after each use - once you finish advair purple disk, then start Symbicort (2puffs in the morning and puffs in the evening, rinse and gargle after each use) - continue with oxygen 2L with any type of exertion (even washing dishes) - keep appointment with Pulmonary rehab on 06/25/14  Your new copd regiment once you finish the current dose of advair will be 1. Symbicort 2. Proair as needed 3. 2L oxygen with exertion and overnight

## 2014-06-16 NOTE — Assessment & Plan Note (Signed)
OSA - Very Severe, AHI=63.8, 16cmH2O, 2L O2 bleed in at night\with sleep. Discussed sleep data and reviewed with patient.  Encouraged proper weight management.  Excessive weight may contribute to snoring.  Monitor sedative use.  Discussed driving precautions and its relationship with hypersomnolence.  Discussed operating dangerous equipment and its relationship with hypersomnolence.  Discussed sleep hygiene, and benefits of a fixed sleep waked time.  The importance of getting eight or more hours of sleep discussed with patient.  Discussed limiting the use of the computer and television before bedtime.  Decrease naps during the day, so night time sleep will become enhanced.  Limit caffeine, and sleep deprivation.  HTN, stroke, and heart failure are potential risk factors.     Plan: Continue with CPAP 16cm H2O with 2L [place] O2 at night. Patient with 100% compliance, average use 8.4hrs, average AHI 2.6 - data for 17 days of use

## 2014-06-16 NOTE — Progress Notes (Signed)
MRN# [id number] Kelly Hale Jul 10, 1955   CC:"followup visit of my COPD and sleep apnea" Chief Complaint  Patient presents with  . Follow-up    Pt here f/u CPAP she wears CPAP 8 hrs nightly. She loves her cpap and does not have any concerns. [name] scheduled to start Pulm Rehab on next Wednesday.     Brief History: 12//3/12- [name] smoker followed for chronic bronchitis, tobacco use LOV- 05/06/10 Did well through the summer. Dr. Larena Glassman increased Advair to 500/50 without benefit. As cooler weather came in, she noticed increased wheeze, cough productive of white or trace yellow sputum. 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 She rates okay at night except that wheeze delays her sleep onset. Still smoking one pack per day and resists admitting that it is causing any problem. She says her husband is similar but doesn't smoke. She chokes easily with liquids. CXR- 05/06/10- bronchitis  06/17/11- [name] smoker followed for chronic bronchitis, tobacco use CXR 05/06/10 reviewed w/ her-  1. No active lung disease.  2. Peribronchial thickening consistent with bronchitis.  3. Thoracic scoliosis.  Provider: Lorina Rabon   She felt okay through this winter with no significant respiratory infection. Dislikes cold weather. Does not expect a pollen problems the spring. Continues Advair 500 and uses her rescue inhaler once every 3 days. Little cough or drainage usually. Pain in right arm and right side of neck hurts enough to keep her awake. 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 We had given samples Spiriva with no effect. Continues to smoke against advice. PFT: 03/22/2011-FEV1 1.49/58%, FEV1/FVC 0.62, insignificant response to bronchodilator, RV 163% indicating air trapping, DLCO 72%. Moderate obstructive airways disease without response to bronchodilator. Air-trapping. Assessment:  COPD with bronchitis - [name], MD at  06/19/2011 8:45 PM   Status: Written Related Problem: BRONCHITIS   Chronic bronchitis pattern in context of moderately severe COPD with poor response to bronchodilator on PFTs. We discussed this as I reviewed the PFTs. Weight loss and smoking cessation of the most important things she could do. Plan-try Brovana nebulizer treatment here     Neuropathic pain, arm - [name], MD at 06/19/2011 8:53 PM   Status: Written Related Problem: Neuropathic pain, arm   She describes pain in the right side of neck and into the right arm with numbness in the thumb and first 2 fingers of the right hand. 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 Chest x-ray does not show a Pancoast tumor. She is to speak with her primary physician about it, anticipating probable referral.  TOBACCO USER - [name], MD at 06/19/2011 8:53 PM   Status: Written Related Problem: TOBACCO USER   I tried to engage her in discussion of smoking cessation approaches. She is not motivated.   ----------------------------------------------------------------------- 08/22/13- 57 yoF smoker (1ppd/ 30 pk yr) with COPD/bronchitis/asthma complicated by tobacco use [name] Cough was worse for 3 weeks but now back at baseline. Denies routine daily cough, but still some green sputum. Took Levaquin for 10 days, prednisone for 10 days ending 4 days ago. Baseline dyspnea on exertion without blood, swelling, chest pain, palpitation. Using [place]. Advair 500, nebulizer albuterol, rescue inhaler. 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 CXR 08/12/13 IMPRESSION:  No acute infiltrate or pulmonary edema. Thoracic dextroscoliosis  again noted. Central mild  bronchitic changes.  Electronically Signed  By: [name] M.D.  On: 08/12/2013 13:22   12/19/13- 34 yoF smoker (1ppd/ 30 pk yr) with  COPD/bronchitis/asthma complicated by tobacco use [name] FOLLOWS FOR:sob,wheeze x 1 wk.,bilat. ankle and leg edema,cough-clear,no fcs,midchest tightness and pain She feels like asthma, coughing clear mucus, persistent midsternal soreness- not radiating or pleuritic, feet swelling a lot.  On arrival here sat mid-70% on room air walking. 90% on O2 2l at rest  12/27/13- 57 yoF former smoker (1ppd/ 30 pk yr) with COPD/bronchitis/asthma complicated by tobacco use [name] Husband here FOLLOWS FOR: Pt here after hospital d/c from [facility](summary pending). Pt c/o DOE, prod cough with little yellow mucus. Pt denies CP/tightness. Dx'd diastolic CHF. Discharged on O2 2L. Pt was 83% RA O2 sat upon arrival to exam room , pt took several deep breaths and increased O2 to 91% and sustained at 90-91% while at rest. 83% walking on room air. Quit smoking in September.  CXR 12/19/13 IMPRESSION:  Cardiomegaly and emphysema without acute disease.  Electronically Signed  By: [name] M.D.  On: 12/19/2013 10:09  02/10/14- 71 yoF former smoker (1ppd/ 30 pk yr) with COPD/bronchitis/asthma complicated by tobacco use, CHF [name] Husband here FOLLOWS FOR: continues to wear O2 2Lat night through [facility]; states breathing has been doing well since last visit. Would like to have a refill of cough syrup as well. Rare need for Proair. Concerned about ability to return to work unloading truck after hosp for [facility]. Sees cardiology in 2 days. Notes DOE walking house to truck.  Acute problem- pain right ear and behind angle of jaw. Not popping.   ROV 03/18/14 5 yoF former smoker (1ppd/ 30 pk yr) with COPD/bronchitis/asthma complicated by tobacco use, CHF [name] Husband here FOLLOWS FOR: chronic respiratory failure, new O2 dependent, also secondary to dCHF. Continues to wear O2 2Lat night through [facility]; states  breathing has been doing well since last visit, but has not been wearing O2 much during the day. Upon arrival to the office her O2 sat was noted to be 84% on room air, requiring 2L to get >88%, then upon resting 93% on RA. Would like to have a refill of cough syrup as well. Rare need for Proair. 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 Notes DOE walking house to truck.  Saw cardiology recently (Dr. [name]) who is optimizing her dCHF with BP control and diuresis (Lasix 20mg  daily, may inc to 40mg  for 3lb wt gain or inc leg swelling).  Patient stated that she is on temporary short term disability (works at [name] in the [employer], lifts boxes up to 30lbs during her 8 hr shift), and is supposed to return to work on 03/24/14 when her shot term disability ends, however she is concerned about her ability to perform her current duties. She will require 2L O2 continuously with minimal exertion, and one 15 lb O2 tank will last 3 hours, requiring her to take 2 tanks to work and move around with it, which will be difficult given that she is mobile at work lifting and moving boxes constantly during her shift.  Plan - COPD - on 2L with exertion, advair\prn albuterol, pulm rehab, check split night study  ROV 05/06/2014 Presents today for a follow up visit of her COPD. Since her last visit she has retired from [employer], and will start a retirement plan shortly. 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  Currently, she has not been wearing her O2 with exertion, only at night, she thought at is what she was supposed to do. Currently she is still smoking 2-4 cigs per day, only wearing nicotine patch 5/7 days Since last visit no ED, urgent or hospitalization for COPD\dyspnea.  Following with cardiology for [facility].  Today upon checkin noted to desat from  car to office, saturation was 87%, promptly return to >90% at rest. She has not been to pulm rehab yet, awaiting referral or appointments.  Today would like to discuss the results of her split night study.  Plan-initiate CPAP, stop using tobacco, pulmonary rehabilitation referral, weight loss   Events since last clinic visit: Patient presents today for followup visit of her COPD and sleep apnea. She was recently started on CPAP pressure of 16 cm of water, and plaster today show that she's been using it for the last 17 days, 100% compliance, AHI 2.6, average use 8.4 hours per night. Patient states that overall she has increased energy, she feels more awake, does not have as much headaches. Down to smoking 2 cigs per day, still with some stressors at home (sick Uncle). Will start Pulm rehab on 06/25/14.   PMHX:   Past Medical History  Diagnosis Date  . Asthma   . Hyperlipidemia   . Aspirin allergy   . Morbid obesity   . Tobacco abuse   . CHF (congestive heart failure)    Surgical Hx:  Past Surgical History  Procedure Laterality Date  . Cesarean section    . Tubal ligation    . Knee arthroscopy      right   Family Hx:  Family History  Problem Relation Age of Onset  . Other Father     MVA  . Hypertension Mother   . Hyperlipidemia Mother   . Asthma Mother   . Other Mother     heart valve replaced  . Diabetes Paternal Grandfather   . Hypertension Maternal Grandfather   . Hyperlipidemia Maternal Grandfather   . Stroke Maternal Grandfather   . Hypertension Maternal Grandmother   . Alzheimer's disease Maternal Grandmother   . Other Maternal Uncle     laryngeal cancer   Social Hx:   History  Substance Use Topics  . Smoking status: Current Every Day Smoker -- 1.00 packs/day for 30 years    Types: Cigarettes  . Smokeless tobacco: Never Used     Comment: quit in Sept 2015, but started back smoking up to 2 cigs per day  . Alcohol Use: No   Medication:   Current  Outpatient Rx  Name  Route  Sig  Dispense  Refill  . albuterol (PROVENTIL) (2.5 MG/3ML) 0.083% nebulizer solution   Nebulization   Take 3 mLs (2.5 mg total) by nebulization every 6 (six) hours as needed for wheezing or shortness of breath.   100 vial   11   . amLODipine (NORVASC) 5 MG tablet   Oral   Take 1 tablet by mouth daily.         . Fluticasone-Salmeterol (ADVAIR) 500-50 MCG/DOSE AEPB   Inhalation   Inhale 1 puff into the lungs every 12 (twelve) hours.         . furosemide (LASIX) 20 MG tablet   Oral   Take 1 tablet (20 mg total) by mouth daily.   [place]  tablet   3   . HYDROcodone-homatropine (HYCODAN) 5-1.5 MG/5ML syrup      5 ml every 6 hours if needed for cough   200 mL   0   . Multiple Vitamin (MULTIVITAMIN) capsule   Oral   Take 1 capsule by mouth daily.         . nicotine (NICODERM CQ - DOSED IN MG/24 HOURS) 21 mg/24hr patch   Transdermal   Place 1 patch (21 mg total) onto the skin daily.   28 patch   1   . OXYGEN   Inhalation   Inhale 2 L into the lungs daily. 2 liters daily         . PROAIR HFA 108 (90 BASE) MCG/ACT inhaler      INHALE 2 PUFFS BY MOUTH 4 TIMES A DAY AS NEEDED   25.5 each   0   . Vitamin D, Ergocalciferol, (DRISDOL) 50000 UNITS CAPS capsule   Oral   Take 1 capsule by mouth once a week.            Review of Systems: Gen:  Denies  fever, sweats, chills HEENT: Denies blurred vision, double vision, ear pain, eye pain, hearing loss, nose bleeds, sore throat Cvc:  No dizziness, chest pain or heaviness Resp:   Denies cough or sputum porduction, shortness of breath Gi: Denies swallowing difficulty, stomach pain, nausea or vomiting, diarrhea, constipation, bowel incontinence Gu:  Denies bladder incontinence, burning urine Ext:   No Joint pain, stiffness or swelling Skin: No skin rash, easy bruising or bleeding or hives Endoc:  No polyuria, polydipsia , polyphagia or weight change Psych: No depression, insomnia or  hallucinations  Other:  All other systems negative  Allergies:  Aspirin  Physical Examination:  VS: BP 132/88 mmHg  Pulse 107  Temp(Src) 98.8 F (37.1 C) (Oral)  Ht 5\' 7"  (1.702 m)  Wt 282 lb 9.6 oz (128.187 kg)  BMI 44.25 kg/m2  SpO2 91%  General Appearance: No distress  Neuro: EXAM: without focal findings, mental status, speech normal, alert and oriented, cranial nerves 2-12 grossly normal  HEENT: PERRLA, EOM intact, no ptosis, no other lesions noticed Pulmonary:Exam: normal breath sounds., diaphragmatic excursion normal.No wheezing, No rales   Cardiovascular:@ Exam:  Normal S1,S2.  No m/r/g.     Abdomen:Exam: Benign, Soft, non-tender, No masses  Skin:   warm, no rashes, no ecchymosis  Extremities: normal, no cyanosis, clubbing, no edema, warm with normal capillary refill.   Labs results:  BMP No results found for: NA, K, CL, CO2, GLUCOSE, BUN, CREATININE   CBC CBC Latest Ref Rng 12/19/2013 09/14/2006  WBC 4.0 - 10.5 K/uL 8.8 -  Hemoglobin 12.0 - 15.0 g/dL 17.1(H) 16.1(H)  Hematocrit 36.0 - 46.0 % 55.3 Repeated and verified X2.(H) -  Platelets 150.0 - 400.0 K/uL 275.0 -     Rad results: none available     Assessment and Plan: COPD with emphysema COPD    Plan-  - O2 2L with minimal exertion and with sleep - current COPD Meds - currently has some left over advair which she will finish and then start Symbicort, as needed albuterol, 2L O2 with exertion - split night study with severe OSA (AHI=63.8) - cpap 16cm H2O - she is requiring supplemental O2 with very minimal exertion up to 2L liters - retired from current job.  - referral to pulmonary rehab - will start on 06/25/14 - overlap syndrome (COPD + OSA) - discussed in detailed with patient - tobacco  cessation - still smoking, down to 2 cigs per day, new quit date 07/11/2014 - cont with nicotine patches - wear daily           TOBACCO USER Tobacco Cessation - Counseling regarding benefits of smoking cessation  strategies was provided for more than 12 min. - Educated that at this time smoking- cessation represents the single most important step that patient can take to enhance the length and quality of live. - Educated patient regarding alternatives of behavior interventions, pharmacotherapy including NRT and non-nicotine therapy such, and combinations of both. - Patient at this time: patient is using nicotine patches, down to 2 cigs per day, new quit date:07/11/14   OSA on CPAP OSA - Very Severe, AHI=63.8, 16cmH2O, 2L O2 bleed in at night\with sleep. Discussed sleep data and reviewed with patient.  Encouraged proper weight management.  Excessive weight may contribute to snoring.  Monitor sedative use.  Discussed driving precautions and its relationship with hypersomnolence.  Discussed operating dangerous equipment and its relationship with hypersomnolence.  Discussed sleep hygiene, and benefits of a fixed sleep waked time.  The importance of getting eight or more hours of sleep discussed with patient.  Discussed limiting the use of the computer and television before bedtime.  Decrease naps during the day, so night time sleep will become enhanced.  Limit caffeine, and sleep deprivation.  HTN, stroke, and heart failure are potential risk factors.     Plan: Continue with CPAP 16cm H2O with 2L [place] O2 at night. Patient with 100% compliance, average use 8.4hrs, average AHI 2.6 - data for 17 days of use          Updated Medication List Outpatient Encounter Prescriptions as of 06/16/2014  Medication Sig  . albuterol (PROVENTIL) (2.5 MG/3ML) 0.083% nebulizer solution Take 3 mLs (2.5 mg total) by nebulization every 6 (six) hours as needed for wheezing or shortness of breath.  [place] amLODipine (NORVASC) 5 MG tablet Take 1 tablet by mouth daily.  . Fluticasone-Salmeterol (ADVAIR) 500-50 MCG/DOSE AEPB Inhale 1 puff into the lungs every 12 (twelve) hours.  . furosemide (LASIX) 20 MG tablet Take 1 tablet  (20 mg total) by mouth daily.  Marland Kitchen HYDROcodone-homatropine (HYCODAN) 5-1.5 MG/5ML syrup 5 ml every 6 hours if needed for cough  . Multiple Vitamin (MULTIVITAMIN) capsule Take 1 capsule by mouth daily.  . nicotine (NICODERM CQ - DOSED IN MG/24 HOURS) 21 mg/24hr patch Place 1 patch (21 mg total) onto the skin daily.  . OXYGEN Inhale 2 L into the lungs daily. 2 liters daily  . PROAIR HFA 108 (90 BASE) MCG/ACT inhaler INHALE 2 PUFFS BY MOUTH 4 TIMES A DAY AS NEEDED  . Vitamin D, Ergocalciferol, (DRISDOL) 50000 UNITS CAPS capsule Take 1 capsule by mouth once a week.    Orders for this visit: No orders of the defined types were placed in this encounter.    Thank  you for the visitation and for allowing  [facility] to assist in the care of your patient. Our recommendations are noted above.  Please contact us if we can be of further service.  Vilinda Boehringer, MD [facility] Office Number: [phone]

## 2014-06-16 NOTE — Progress Notes (Signed)
MRN# [id number] Kelly Hale 1955/06/13   CC:"followup visit of my COPD and sleep apnea" Chief Complaint  Patient presents with  . Follow-up    Pt here f/u CPAP she wears CPAP 8 hrs nightly. She loves her cpap and does not have any concerns. [name] scheduled to start Pulm Rehab on next Wednesday.     Brief History: 12//3/12- [name] smoker followed for chronic bronchitis, tobacco use LOV- 05/06/10 Did well through the summer. Dr. Larena Glassman increased Advair to 500/50 without benefit. As cooler weather came in, she noticed increased wheeze, cough productive of white or trace yellow sputum. 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 She rates okay at night except that wheeze delays her sleep onset. Still smoking one pack per day and resists admitting that it is causing any problem. She says her husband is similar but doesn't smoke. She chokes easily with liquids. CXR- 05/06/10- bronchitis  06/17/11- [name] smoker followed for chronic bronchitis, tobacco use CXR 05/06/10 reviewed w/ her-  1. No active lung disease.  2. Peribronchial thickening consistent with bronchitis.  3. Thoracic scoliosis.  Provider: Lorina Rabon   She felt okay through this winter with no significant respiratory infection. Dislikes cold weather. Does not expect a pollen problems the spring. Continues Advair 500 and uses her rescue inhaler once every 3 days. Little cough or drainage usually. Pain in right arm and right side of neck hurts enough to keep her awake. 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 We had given samples Spiriva with no effect. Continues to smoke against advice. PFT: 03/22/2011-FEV1 1.49/58%, FEV1/FVC 0.62, insignificant response to bronchodilator, RV 163% indicating air trapping, DLCO 72%. Moderate obstructive airways disease without response to bronchodilator. Air-trapping. Assessment:  COPD with bronchitis - Kelly Lever, MD at  06/19/2011 8:45 PM   Status: Written Related Problem: BRONCHITIS   Chronic bronchitis pattern in context of moderately severe COPD with poor response to bronchodilator on PFTs. We discussed this as I reviewed the PFTs. Weight loss and smoking cessation of the most important things she could do. Plan-try Brovana nebulizer treatment here     Neuropathic pain, arm - Kelly Lever, MD at 06/19/2011 8:53 PM   Status: Written Related Problem: Neuropathic pain, arm   She describes pain in the right side of neck and into the right arm with numbness in the thumb and first 2 fingers of the right hand. 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 Chest x-ray does not show a Pancoast tumor. She is to speak with her primary physician about it, anticipating probable referral.  TOBACCO USER - Kelly Lever, MD at 06/19/2011 8:53 PM   Status: Written Related Problem: TOBACCO USER   I tried to engage her in discussion of smoking cessation approaches. She is not motivated.   ----------------------------------------------------------------------- 08/22/13- 57 yoF smoker (1ppd/ 30 pk yr) with COPD/bronchitis/asthma complicated by tobacco use [name] Cough was worse for 3 weeks but now back at baseline. Denies routine daily cough, but still some green sputum. Took Levaquin for 10 days, prednisone for 10 days ending 4 days ago. Baseline dyspnea on exertion without blood, swelling, chest pain, palpitation. Using [place]. Advair 500, nebulizer albuterol, rescue inhaler. 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 CXR 08/12/13 IMPRESSION:  No acute infiltrate or pulmonary edema. Thoracic dextroscoliosis  again noted. Central mild  bronchitic changes.  Electronically Signed  By: [name] M.D.  On: 08/12/2013 13:22   12/19/13- 31 yoF smoker (1ppd/ 30 pk yr) with  COPD/bronchitis/asthma complicated by tobacco use [name] FOLLOWS FOR:sob,wheeze x 1 wk.,bilat. ankle and leg edema,cough-clear,no fcs,midchest tightness and pain She feels like asthma, coughing clear mucus, persistent midsternal soreness- not radiating or pleuritic, feet swelling a lot.  On arrival here sat mid-70% on room air walking. 90% on O2 2l at rest  12/27/13- 57 yoF former smoker (1ppd/ 30 pk yr) with COPD/bronchitis/asthma complicated by tobacco use [name] Husband here FOLLOWS FOR: Pt here after hospital d/c from [place](summary pending). Pt c/o DOE, prod cough with little yellow mucus. Pt denies CP/tightness. Dx'd diastolic CHF. Discharged on O2 2L. Pt was 83% RA O2 sat upon arrival to exam room , pt took several deep breaths and increased O2 to 91% and sustained at 90-91% while at rest. 83% walking on room air. Quit smoking in September.  CXR 12/19/13 IMPRESSION:  Cardiomegaly and emphysema without acute disease.  Electronically Signed  By: Kelly Hale M.D.  On: 12/19/2013 10:09  02/10/14- 7 yoF former smoker (1ppd/ 30 pk yr) with COPD/bronchitis/asthma complicated by tobacco use, CHF [name] Husband here FOLLOWS FOR: continues to wear O2 2Lat night through Kelly Hale; states breathing has been doing well since last visit. Would like to have a refill of cough syrup as well. Rare need for Proair. Concerned about ability to return to work unloading truck after hosp for Kelly [facility] Kelly Hale. Sees cardiology in 2 days. Notes DOE walking house to truck.  Acute problem- pain right ear and behind angle of jaw. Not popping.   ROV 03/18/14 81 yoF former smoker (1ppd/ 30 pk yr) with COPD/bronchitis/asthma complicated by tobacco use, CHF [name] Husband here FOLLOWS FOR: chronic respiratory failure, new O2 dependent, also secondary to dCHF. Continues to wear O2 2Lat night through [facility]; states  breathing has been doing well since last visit, but has not been wearing O2 much during the day. Upon arrival to the office her O2 sat was noted to be 84% on room air, requiring 2L to get >88%, then upon resting 93% on RA. Would like to have a refill of cough syrup as well. Rare need for Proair. 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 Notes DOE walking house to truck.  Saw cardiology recently (Dr. [name]) who is optimizing her dCHF with BP control and diuresis (Lasix 20mg  daily, may inc to 40mg  for 3lb wt gain or inc leg swelling).  Patient stated that she is on temporary short term disability (works at [name] in the [employer], lifts boxes up to 30lbs during her 8 hr shift), and is supposed to return to work on 03/24/14 when her shot term disability ends, however she is concerned about her ability to perform her current duties. She will require 2L O2 continuously with minimal exertion, and one 15 lb O2 tank will last 3 hours, requiring her to take 2 tanks to work and move around with it, which will be difficult given that she is mobile at work lifting and moving boxes constantly during her shift.  Plan - COPD - on 2L with exertion, advair\prn albuterol, pulm rehab, check split night study  ROV 05/06/2014 Presents today for a follow up visit of her COPD. Since her last visit she has retired from [employer], and will start a retirement plan shortly. 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  Currently, she has not been wearing her O2 with exertion, only at night, she thought at is what she was supposed to do. Currently she is still smoking 2-4 cigs per day, only wearing nicotine patch 5/7 days Since last visit no ED, urgent or hospitalization for COPD\dyspnea.  Following with cardiology for [facility].  Today upon checkin noted to desat from  car to office, saturation was 87%, promptly return to >90% at rest. She has not been to pulm rehab yet, awaiting referral or appointments.  Today would like to discuss the results of her split night study.  Plan-initiate CPAP, stop using tobacco, pulmonary rehabilitation referral, weight loss   Events since last clinic visit: Patient presents today for followup visit of her COPD and sleep apnea. She was recently started on CPAP pressure of 16 cm of water, and plaster today show that she's been using it for the last 17 days, 100% compliance, AHI 2.6, average use 8.4 hours per night. Patient states that overall she has increased energy, she feels more awake, does not have as much headaches. Down to smoking 2 cigs per day, still with some stressors at home (sick Uncle). Will start Pulm rehab on 06/25/14.   PMHX:   Past Medical History  Diagnosis Date  . Asthma   . Hyperlipidemia   . Aspirin allergy   . Morbid obesity   . Tobacco abuse   . CHF (congestive heart failure)    Surgical Hx:  Past Surgical History  Procedure Laterality Date  . Cesarean section    . Tubal ligation    . Knee arthroscopy      right   Family Hx:  Family History  Problem Relation Age of Onset  . Other Father     MVA  . Hypertension Mother   . Hyperlipidemia Mother   . Asthma Mother   . Other Mother     heart valve replaced  . Diabetes Paternal Grandfather   . Hypertension Maternal Grandfather   . Hyperlipidemia Maternal Grandfather   . Stroke Maternal Grandfather   . Hypertension Maternal Grandmother   . Alzheimer's disease Maternal Grandmother   . Other Maternal Uncle     laryngeal cancer   Social Hx:   History  Substance Use Topics  . Smoking status: Current Every Day Smoker -- 1.00 packs/day for 30 years    Types: Cigarettes  . Smokeless tobacco: Never Used     Comment: quit in Sept 2015, but started back smoking up to 2 cigs per day  . Alcohol Use: No   Medication:   Current  Outpatient Rx  Name  Route  Sig  Dispense  Refill  . albuterol (PROVENTIL) (2.5 MG/3ML) 0.083% nebulizer solution   Nebulization   Take 3 mLs (2.5 mg total) by nebulization every 6 (six) hours as needed for wheezing or shortness of breath.   100 vial   11   . amLODipine (NORVASC) 5 MG tablet   Oral   Take 1 tablet by mouth daily.         . Fluticasone-Salmeterol (ADVAIR) 500-50 MCG/DOSE AEPB   Inhalation   Inhale 1 puff into the lungs every 12 (twelve) hours.         . furosemide (LASIX) 20 MG tablet   Oral   Take 1 tablet (20 mg total) by mouth daily.   [place]  tablet   3   . HYDROcodone-homatropine (HYCODAN) 5-1.5 MG/5ML syrup      5 ml every 6 hours if needed for cough   200 mL   0   . Multiple Vitamin (MULTIVITAMIN) capsule   Oral   Take 1 capsule by mouth daily.         . nicotine (NICODERM CQ - DOSED IN MG/24 HOURS) 21 mg/24hr patch   Transdermal   Place 1 patch (21 mg total) onto the skin daily.   28 patch   1   . OXYGEN   Inhalation   Inhale 2 L into the lungs daily. 2 liters daily         . PROAIR HFA 108 (90 BASE) MCG/ACT inhaler      INHALE 2 PUFFS BY MOUTH 4 TIMES A DAY AS NEEDED   25.5 each   0   . Vitamin D, Ergocalciferol, (DRISDOL) 50000 UNITS CAPS capsule   Oral   Take 1 capsule by mouth once a week.            Review of Systems: Gen:  Denies  fever, sweats, chills HEENT: Denies blurred vision, double vision, ear pain, eye pain, hearing loss, nose bleeds, sore throat Cvc:  No dizziness, chest pain or heaviness Resp:   Denies cough or sputum porduction, shortness of breath Gi: Denies swallowing difficulty, stomach pain, nausea or vomiting, diarrhea, constipation, bowel incontinence Gu:  Denies bladder incontinence, burning urine Ext:   No Joint pain, stiffness or swelling Skin: No skin rash, easy bruising or bleeding or hives Endoc:  No polyuria, polydipsia , polyphagia or weight change Psych: No depression, insomnia or  hallucinations  Other:  All other systems negative  Allergies:  Aspirin  Physical Examination:  VS: BP 132/88 mmHg  Pulse 107  Temp(Src) 98.8 F (37.1 C) (Oral)  Ht 5\' 7"  (1.702 m)  Wt 282 lb 9.6 oz (128.187 kg)  BMI 44.25 kg/m2  SpO2 91%  General Appearance: No distress  Neuro: EXAM: without focal findings, mental status, speech normal, alert and oriented, cranial nerves 2-12 grossly normal  HEENT: PERRLA, EOM intact, no ptosis, no other lesions noticed Pulmonary:Exam: normal breath sounds., diaphragmatic excursion normal.No wheezing, No rales   Cardiovascular:@ Exam:  Normal S1,S2.  No m/r/g.     Abdomen:Exam: Benign, Soft, non-tender, No masses  Skin:   warm, no rashes, no ecchymosis  Extremities: normal, no cyanosis, clubbing, no edema, warm with normal capillary refill.   Labs results:  BMP No results found for: NA, K, CL, CO2, GLUCOSE, BUN, CREATININE   CBC CBC Latest Ref Rng 12/19/2013 09/14/2006  WBC 4.0 - 10.5 K/uL 8.8 -  Hemoglobin 12.0 - 15.0 g/dL 17.1(H) 16.1(H)  Hematocrit 36.0 - 46.0 % 55.3 Repeated and verified X2.(H) -  Platelets 150.0 - 400.0 K/uL 275.0 -     Rad results: none available     Assessment and Plan: COPD with emphysema COPD    Plan-  - O2 2L with minimal exertion and with sleep - current COPD Meds - currently has some left over advair which she will finish and then start Symbicort, as needed albuterol, 2L O2 with exertion - split night study with severe OSA (AHI=63.8) - cpap 16cm H2O - she is requiring supplemental O2 with very minimal exertion up to 2L liters - retired from current job.  - referral to pulmonary rehab - will start on 06/25/14 - overlap syndrome (COPD + OSA) - discussed in detailed with patient - tobacco  cessation - still smoking, down to 2 cigs per day, new quit date 07/11/2014 - cont with nicotine patches - wear daily           TOBACCO USER Tobacco Cessation - Counseling regarding benefits of smoking cessation  strategies was provided for more than 12 min. - Educated that at this time smoking- cessation represents the single most important step that patient can take to enhance the length and quality of live. - Educated patient regarding alternatives of behavior interventions, pharmacotherapy including NRT and non-nicotine therapy such, and combinations of both. - Patient at this time: patient is using nicotine patches, down to 2 cigs per day, new quit date:07/11/14   OSA on CPAP OSA - Very Severe, AHI=63.8, 16cmH2O, 2L O2 bleed in at night\with sleep. Discussed sleep data and reviewed with patient.  Encouraged proper weight management.  Excessive weight may contribute to snoring.  Monitor sedative use.  Discussed driving precautions and its relationship with hypersomnolence.  Discussed operating dangerous equipment and its relationship with hypersomnolence.  Discussed sleep hygiene, and benefits of a fixed sleep waked time.  The importance of getting eight or more hours of sleep discussed with patient.  Discussed limiting the use of the computer and television before bedtime.  Decrease naps during the day, so night time sleep will become enhanced.  Limit caffeine, and sleep deprivation.  HTN, stroke, and heart failure are potential risk factors.     Plan: Continue with CPAP 16cm H2O with 2L [place] O2 at night. Patient with 100% compliance, average use 8.4hrs, average AHI 2.6 - data for 17 days of use          Updated Medication List Outpatient Encounter Prescriptions as of 06/16/2014  Medication Sig  . albuterol (PROVENTIL) (2.5 MG/3ML) 0.083% nebulizer solution Take 3 mLs (2.5 mg total) by nebulization every 6 (six) hours as needed for wheezing or shortness of breath.  [place] Kitchen amLODipine (NORVASC) 5 MG tablet Take 1 tablet by mouth daily.  . Fluticasone-Salmeterol (ADVAIR) 500-50 MCG/DOSE AEPB Inhale 1 puff into the lungs every 12 (twelve) hours.  . furosemide (LASIX) 20 MG tablet Take 1 tablet  (20 mg total) by mouth daily.  Marland Kitchen HYDROcodone-homatropine (HYCODAN) 5-1.5 MG/5ML syrup 5 ml every 6 hours if needed for cough  . Multiple Vitamin (MULTIVITAMIN) capsule Take 1 capsule by mouth daily.  . nicotine (NICODERM CQ - DOSED IN MG/24 HOURS) 21 mg/24hr patch Place 1 patch (21 mg total) onto the skin daily.  . OXYGEN Inhale 2 L into the lungs daily. 2 liters daily  . PROAIR HFA 108 (90 BASE) MCG/ACT inhaler INHALE 2 PUFFS BY MOUTH 4 TIMES A DAY AS NEEDED  . Vitamin D, Ergocalciferol, (DRISDOL) 50000 UNITS CAPS capsule Take 1 capsule by mouth once a week.    Orders for this visit: No orders of the defined types were placed in this encounter.    Thank  you for the visitation and for allowing  [facility] to assist in the care of your patient. Our recommendations are noted above.  Please contact us if we can be of further service.  Vilinda Boehringer, MD [facility] Office Number: [phone]

## 2014-06-16 NOTE — Assessment & Plan Note (Signed)
COPD    Plan-  - O2 2L with minimal exertion and with sleep - current COPD Meds - currently has some left over advair which she will finish and then start Symbicort, as needed albuterol, 2L O2 with exertion - split night study with severe OSA (AHI=63.8) - cpap 16cm H2O - she is requiring supplemental O2 with very minimal exertion up to 2L liters - retired from current job.  - referral to pulmonary rehab - will start on 06/25/14 - overlap syndrome (COPD + OSA) - discussed in detailed with patient - tobacco cessation - still smoking, down to 2 cigs per day, new quit date 07/11/2014 - cont with nicotine patches - wear daily

## 2014-06-16 NOTE — Telephone Encounter (Signed)
Called spoke with pt. Made her aware this medication needs to come from PCP. Nothing further needed

## 2014-06-16 NOTE — Assessment & Plan Note (Signed)
Tobacco Cessation - Counseling regarding benefits of smoking cessation strategies was provided for more than 12 min. - Educated that at this time smoking- cessation represents the single most important step that patient can take to enhance the length and quality of live. - Educated patient regarding alternatives of behavior interventions, pharmacotherapy including NRT and non-nicotine therapy such, and combinations of both. - Patient at this time: patient is using nicotine patches, down to 2 cigs per day, new quit date:07/11/14

## 2014-06-24 ENCOUNTER — Encounter
Admit: 2014-06-24 | Disposition: A | Discharge status: Home / Self Care | Payer: Self-pay | Attending: Internal Medicine | Admitting: Internal Medicine

## 2014-06-25 ENCOUNTER — Encounter: Payer: Self-pay | Admitting: Internal Medicine

## 2014-07-11 ENCOUNTER — Encounter
Admit: 2014-07-11 | Disposition: A | Discharge status: Home / Self Care | Payer: Self-pay | Attending: Internal Medicine | Admitting: Internal Medicine

## 2014-08-02 NOTE — Consult Note (Signed)
General Aspect Pulmonologist:  C. Annamaria Boots, MD Cardiologist:  New - seen by Johnny Bridge, MD  _____________  59 y/o female with a h/o asthma, tob abuse, and dyspnea, who presented to the [facility] ED yesterday with acute resp failure and evidence of volume overload. _____________   Past Medical History ??? Asthma  ??? Hyperlipidemia  ??? Aspirin allergy  ??? Morbid obesity  ??? Tobacco abuse   Past Surgical History ??? Cesarean section   ??? Tubal ligation   ??? Knee arthroscopy     right _____________   Family History ??? Other Father    MVA ??? Hypertension Mother  ??? Hyperlipidemia Mother  ??? Asthma Mother  ??? Other Mother    heart valve replaced ??? Diabetes Paternal Grandfather  ??? Hypertension Maternal Grandfather  ??? Hyperlipidemia Maternal Grandfather  ??? Stroke Maternal Grandfather  ??? Hypertension Maternal Grandmother  ??? Alzheimer's disease Maternal Grandmother  ??? Other Maternal Uncle    laryngeal cancer _____________  Social History ??? Marital Status: Married   Spouse Name: Joe   Number of Children: N/A ??? Years of Education: N/A  Occupational History ??? [employer]  Social History Main Topics ??? Smoking status: Current Every Day Smoker -- 1.00 packs/day for 30 years   Types: Cigarettes ??? Smokeless tobacco: Not on file ??? Alcohol Use: Not on file ??? Drug Use: Not on file ??? Sexual Activity: Not on file _____________   Present Illness 59 year old white female with history of diagnosis of asthma,  nicotine addiction, COPD, who presents with shortness of breath ongoing for the past 1 week. Cardiology was consulted for diastolic CHF.  She was seen by her primary pulmonologist in [place] (Dr. Annamaria Boots) on Thursday, was given Lasix and Augmentin, was told that she had fluid around her heart. Despite taking the Lasix she has had progressive swelling of her lower extremity and progressive shortness of breath.  She has short of breath with minimal exertion. She reports a cough and has had wheezing as well. She has had chest pressure with her shortness of breath, typically at rest. Has not had any fevers, chills. No nausea. She did have 2 days of diarrhea after the antibiotics. She has nebs at home.  This AM, she continues to have wheezing and mild SOB. Edema has resolved.   SOCIAL HISTORY:  Smokes 3/4 pack per day. Has been smoking for more than 30 years. No alcohol or drug use.   FAMILY HISTORY:  Mother's father with asthma, grandmother with valvular heart disease with repair. There is also diabetes in the family.   Physical Exam:  GEN well developed, well nourished, no acute distress   HEENT hearing intact to voice, moist oral mucosa   NECK supple  No masses   RESP normal resp effort  wheezing  on oxygen   CARD Regular rate and rhythm  No murmur   ABD denies tenderness  soft   LYMPH negative neck   EXTR negative edema   NEURO motor/sensory function intact   PSYCH alert, A+O to time, place, person, good insight   Review of Systems:  Subjective/Chief Complaint SOB, wheezing, tight in the chest (improving)   General: Fatigue   Skin: No Complaints   ENT: No Complaints   Eyes: No Complaints   Neck: No Complaints   Respiratory: Short of breath  Wheezing   Cardiovascular: Tightness  Dyspnea   Gastrointestinal: No Complaints   Genitourinary: No Complaints   Vascular: No Complaints  Musculoskeletal: No Complaints   Neurologic: No Complaints   Hematologic: No Complaints   Endocrine: No Complaints   Psychiatric: No Complaints   Review of Systems: All other systems were reviewed and found to be negative   Medications/Allergies Reviewed Medications/Allergies reviewed   Family & Social History:  Family and Social History:  Family History Coronary Artery Disease   Social History positive  tobacco   + Tobacco Current (within 1 year)   Place of Living Home      asthma:   Home Medications: Medication Instructions Status  ProAir HFA CFC free 90 mcg/inh inhalation aerosol 2 puff(s) inhaled 4 times a day Active  Augmentin 875 mg-125 mg oral tablet 1 tab(s) orally every 12 hours Active  Advair Diskus 500 mcg-50 mcg inhalation powder 1 puff(s) inhaled 2 times a day Active   Lab Results:  Routine Chem:  15-Sep-15 05:08   Result Comment HGB/HCT - RESULTS VERIFIED BY REPEAT TESTING.  Result(s) reported on 24 Dec 2013 at 05:42AM.  Cholesterol, Serum 128  Triglycerides, Serum 65  HDL (INHOUSE) 50  VLDL Cholesterol Calculated 13  LDL Cholesterol Calculated 65 (Result(s) reported on 24 Dec 2013 at 05:42AM.)  Glucose, Serum  118  BUN 14  Creatinine (comp) 0.69  Sodium, Serum 142  Potassium, Serum 4.2  Chloride, Serum 98  CO2, Serum  37  Calcium (Total), Serum 8.5  Anion Gap 7  Osmolality (calc) 285  eGFR (African American) >60  eGFR (Non-African American) >60 (eGFR values <79m/min/1.73 m2 may be an indication of chronic kidney disease (CKD). Calculated eGFR is useful in patients with stable renal function. The eGFR calculation will not be reliable in acutely ill patients when serum creatinine is changing rapidly. It is not useful in  patients on dialysis. The eGFR calculation may not be applicable to patients at the low and high extremes of body sizes, pregnant women, and vegetarians.)  Hemoglobin A1c ([facility]) 6.0 (The American Diabetes Association recommends that a primary goal of therapy should be <7% and that physicians should reevaluate the treatment regimen in patients with HbA1c values consistently >8%.)  Cardiac:  14-Sep-15 13:38   Troponin I < 0.02 (0.00-0.05 0.05 ng/mL or less: NEGATIVE  Repeat testing in 3-6 hrs  if clinically indicated. >0.05 ng/mL: POTENTIAL  MYOCARDIAL INJURY. Repeat  testing in 3-6 hrs if  clinically indicated. NOTE: An increase or decrease  of 30% or more on serial  testing suggests a  clinically  important change)    17:38   Troponin I < 0.02 (0.00-0.05 0.05 ng/mL or less: NEGATIVE  Repeat testing in 3-6 hrs  if clinically indicated. >0.05 ng/mL: POTENTIAL  MYOCARDIAL INJURY. Repeat  testing in 3-6 hrs if  clinically indicated. NOTE: An increase or decrease  of 30% or more on serial  testing suggests a  clinically important change)    21:34   Troponin I < 0.02 (0.00-0.05 0.05 ng/mL or less: NEGATIVE  Repeat testing in 3-6 hrs  if clinically indicated. >0.05 ng/mL: POTENTIAL  MYOCARDIAL INJURY. Repeat  testing in 3-6 hrs if  clinically indicated. NOTE: An increase or decrease  of 30% or more on serial  testing suggests a  clinically important change)  Routine Hem:  15-Sep-15 05:08   WBC (CBC) 7.6  RBC (CBC)  5.71  Hemoglobin (CBC)  16.2  Hematocrit (CBC)  53.4  Platelet Count (CBC) 266  MCV 93  MCH 28.4  MCHC  30.4  RDW  17.3  Neutrophil % 88.7  Lymphocyte % 7.5  Monocyte % 3.5  Eosinophil % 0.0  Basophil % 0.3  Neutrophil #  6.7  Lymphocyte #  0.6  Monocyte # 0.3  Eosinophil # 0.0  Basophil # 0.0   EKG:  Interpretation EKG shows NSR with no significant ST or T wave changes   Radiology Results: Cardiology:    14-Sep-15 18:36, Echo Doppler  Echo Doppler   REASON FOR EXAM:      COMMENTS:       PROCEDURE: [facility] - ECHO DOPPLER COMPLETE(TRANSTHOR)  - Dec 23 2013  6:36PM     RESULT: Echocardiogram Report    Patient Name:   Kelly Hale Date of Exam: 12/23/2013  Medical Rec #:  [id number]         Custom1:  Date of Birth:  15-Sep-1955     Height:       66.0 in  Patient Age:    74 years       Weight:       273.0 lb  Patient Gender: F              BSA:          2.28 m??    Indications: CHF  Sonographer:    Arville Go RDCS  Referring Phys: Dustin Flock, H    Summary:   1. Challenging image quality secondary to body habitus.   2. Left ventricular ejection fraction, by visual estimation, is 60 to   65%.   3. Normal global left ventricular  systolic function.   4. Impaired relaxation pattern of LV diastolic filling.   5. Mild left ventricular hypertrophy.   6. Normal right ventricular size and systolic function.   7. Normal RVSP  2D AND M-MODE MEASUREMENTS (normal ranges within parentheses):  Left Ventricle:          Normal  IVSd (2D):      1.30 cm (0.7-1.1)  LVPWd (2D):     1.32 cm (0.7-1.1) Aorta/LA:                  Normal  LVIDd (2D):     4.43 cm (3.4-5.7) Aortic Root (2D): 2.90 cm (2.4-3.7)  LVIDs (2D):     2.94 cm           Left Atrium (2D): 3.10 cm (1.9-4.0)  LV FS (2D):     33.6 %   (>25%)  LV EF (2D):     62.6 %   (>50%)                                    Right Ventricle:                                    RVd (2D):  LV DIASTOLIC FUNCTION:  MV Peak E: 0.82 m/s Decel Time: 216 msec  MV Peak A: 0.90 m/s  E/A Ratio: 0.91  SPECTRAL DOPPLERANALYSIS (where applicable):  Mitral Valve:  MV P1/2 Time: 62.64 msec  MV Area, PHT: 3.51 cm??  Aortic Valve: AoV Max Vel: 2.00 m/s AoV Peak PG: 16.1 mmHg AoV Mean PG:     9.0 mmHg  LVOT Vmax: 1.62 m/s LVOT VTI:  LVOT Diameter:  Tricuspid Valve and PA/RV Systolic Pressure: TR Max Velocity: 2.62 m/s RA   Pressure: 5 mmHg RVSP/PASP: 32.5 mmHg  Pulmonic Valve:  PV Max Velocity: 1.41  m/s PV Max PG: 8.0 mmHg PV Mean PG:    PHYSICIAN INTERPRETATION:  Left Ventricle: The left ventricular internal cavity size was normal.   Mild left ventricular hypertrophy. Global LV systolic function was   normal. Left ventricular ejection fraction, by visual estimation, is 60   to 65%. Spectral Doppler shows impaired relaxation pattern of LV   diastolic filling.  Right Ventricle: Normal right ventricular size, wall thickness, and   systolic function. The right ventricular size is normal. Global RV     systolic function is normal.  Left Atrium: The left atrium is normal in size.  Right Atrium: The right atrium isnormal in size.  Pericardium: There is no evidence of pericardial  effusion.  Mitral Valve: The mitral valve is normal in structure. Trace mitral valve   regurgitation is seen.  Tricuspid Valve: The tricuspid valve is normal. Trivial tricuspid   regurgitation is visualized. The tricuspid regurgitant velocity is 2.62   m/s, and with an assumed right atrial pressure of 5 mmHg, the estimated   right ventricular systolic pressure is normal at 32.5 mmHg.  Aortic Valve: The aortic valve is structurally normal, with no evidence   of sclerosis or stenosis. No evidence of aortic valve regurgitation is   seen.    [id number] Ida Rogue MD  Electronically signed by [id number] Ida Rogue MD  Signature Date/Time: 12/24/2013/8:18:48 AM    *** Final ***    IMPRESSION: .        Verified By: Minna Merritts, M.D., MD    Aspirin: Cough  Vital Signs/Nurse's Notes: **Vital Signs.:   15-Sep-15 06:00  Vital Signs Type Routine  Temperature Temperature (F) 97.7  Celsius 36.5  Temperature Source oral  Pulse Pulse 89  Respirations Respirations 20  Systolic BP Systolic BP 784  Diastolic BP (mmHg) Diastolic BP (mmHg) 69  Mean BP 85  Pulse Ox % Pulse Ox % 90  Pulse Ox Activity Level  At rest  Oxygen Delivery 3L    Impression 59 year old white female with history of diagnosis of asthma,  nicotine addiction, COPD, who presents with shortness of breath ongoing for the past 1 week. Cardiology was consulted for diastolic CHF.  1) Respiratory distress: Suspect etiology is secondary to underlying COPD exacerbation in setting od acute diastolic CHF Echo suggesting she is now euvolemic, normal RVSP, after diuresis. 4.7 liters out yesterday She is still wheezy, on nebs, steroids, ABX. Still on oxygen. --Would change lasix to po daily (from IV) --Continuer COPD management, needs prednisone taper. already on inhalers, follow up with Dr. Annamaria Boots in [facility] or change to Dr. Stevenson Clinch in [place]  2) Acute diastolic CHF: may benefit from follow up in CHF clinic Would d/c on lasix  20 mg daily, extra as needed for weight gain or leg edema Will avoid b-blocker  and ACE inh given lung disease.  3) Nicotine addiction.  Smoking cessation counseling done  on a nicotine patch.  4.  Morbid obesity.  Needs to  lose weight.   4.  Dispo on Lovenox for deep vein thrombosis prophylaxis.   Electronic Signatures: Rogelia Mire (NP)  (Signed 15-Sep-15 08:09)  Authored: General Aspect/Present Illness, Home Medications, Allergies Ida Rogue (MD)  (Signed 15-Sep-15 10:32)  Authored: General Aspect/Present Illness, History and Physical Exam, Review of System, Family & Social History, Past Medical History, Health Issues, Home Medications, Labs, EKG , Radiology, Vital Signs/Nurse's Notes, Impression/Plan  Co-Signer: General Aspect/Present Illness, Home Medications, Allergies   Last Updated: 15-Sep-15 10:32 by Ida Rogue (  MD) 

## 2014-08-02 NOTE — H&P (Signed)
PATIENT NAME:  Kelly Hale, Kelly Hale MR#:  [id number] DATE OF BIRTH:  1956-01-11  DATE OF ADMISSION:  12/23/2013  PRIMARY CARE PROVIDER: Emeline General. Dema Severin, MD  EMERGENCY DEPARTMENT REFERRING PHYSICIAN: Lenise Arena, MD   CHIEF COMPLAINT: Shortness of breath, swelling of the lower extremity.   HISTORY OF PRESENT ILLNESS: The patient is a 59 year old white female with history of diagnosis of asthma, also has history of nicotine addiction, who presents with shortness of breath ongoing for the past 1 week. She was seen by her primary pulmonologist in [place] on Thursday, was given Lasix and Augmentin, was told that she had fluid around her heart. The patient reports that despite taking the Lasix she has had progressive swelling of her lower extremity and progressive shortness of breath. She gets short of breath with minimal exertion. She also has had a cough since starting antibiotics and has had wheezing as well. The patient reports that she sleeps on the side of the bed and does not wake up in the middle of the night with shortness of breath. She also has had chest pressure with shortness of breath. She also has chest pressure with exertion. Has not had any fevers, chills. No nausea. She did have 2 days of diarrhea after the antibiotics, but that since has resolved.   PAST MEDICAL HISTORY: Significant for asthma.   PAST SURGICAL HISTORY: None.   ALLERGIES: ASPIRIN, WHICH MAKES HER ASTHMA WORSE.   CURRENT MEDICATIONS AT HOME: ProAir 2 puffs 4 times a day, Augmentin 875/125 one tablet p.o. q. 12, Advair to 500/50 one puff b.i.d. and Lasix.   SOCIAL HISTORY: Smokes 3/4 pack per day. Has been smoking for more than 30 years. No alcohol or drug use.   FAMILY HISTORY: Mother's father with asthma, grandmother with valvular heart disease with repair. There is also diabetes in the family.   REVIEW OF SYSTEMS: CONSTITUTIONAL: Denies any fevers. Complains of fatigue, weakness. Denies any weight loss.  Complains or weight gain, she is not sure how much.  EYES: No blurred or double vision. No redness. No inflammation. No glaucoma. No cataracts.  EARS, NOSE AND THROAT: No tinnitus. No ear pain. No hearing loss. No seasonal or year-round allergies. No difficulty swallowing.  RESPIRATORY: Complains of cough, wheezing, shortness of breath, has a history of asthma.  CARDIOVASCULAR: Complains of chest pressure. No orthopnea. Complains or edema. No arrhythmia. Complains of dyspnea on exertion. No palpitation. No syncope.  GASTROINTESTINAL: No nausea, vomiting. Had an episode of diarrhea that has resolved. No abdominal pain. No hematemesis. No melena. No ulcer.  GENITOURINARY: Denies any dysuria, hematuria, renal colic or frequency.  ENDOCRINE: Denies any polyuria, nocturia or thyroid problems.  HEMATOLOGIC AND LYMPHATIC: Denies anemia, easy bruisability or bleeding.  SKIN: No acne. No rash. No changes in mole, hair or skin.  MUSCULOSKELETAL: Denies any pain in the neck, back or shoulder.  NEUROLOGIC: No numbness, CVA, TIA or seizures.  PSYCHIATRIC: No anxiety, insomnia or ADD.   PHYSICAL EXAMINATION:  VITAL SIGNS: Temperature 98.7, pulse 105, respirations 20, blood pressure 190/94, oxygen saturation 78% on room air.  GENERAL: The patient is an obese female in mild respiratory distress.  HEENT: Head atraumatic, normocephalic. Pupils equally round, reactive to light and accommodation. There is no conjunctival pallor. No scleral icterus. Extraocular movements intact. Nasal exam shows no drainage or ulceration. External ear exam shows no erythema or ulceration.  NECK: Supple without any JVD. No thyromegaly.  CARDIOVASCULAR: Regular rate and rhythm. No murmurs, rubs, clicks or gallops.  LUNGS: Bilateral wheezing throughout both lungs, crackles at the bases. There is some accessory muscle usage.  ABDOMEN: Soft, nontender, nondistended. Positive bowel sounds x 4. No hepatosplenomegaly.  EXTREMITIES: Edema  2+.  SKIN: No rash.  LYMPHATICS: No lymph nodes palpable.  VASCULAR: Good DP, PT pulses.  PSYCHIATRIC: Not anxious or depressed.  NEUROLOGIC: Awake, alert, and oriented x 3. No focal deficits.   DATA: EKG normal sinus rhythm without any ST-T wave changes, possible left atrial enlargement.   EVALUATIONS: ABG with pH venous 7.36, pCO2 of 70, BNP 2318 CPK 113, CK-MB 2.1. Glucose 114, BUN 12, creatinine 0.69, sodium 141, potassium 3.7, chloride 100, CO2 is 35. LFTs are normal except albumin of 2.6. Troponin less than 0.02.   IMAGING: Chest x-ray shows findings consistent with mild cardiomegaly with mild vascular congestion.   ASSESSMENT AND PLAN: The patient is a 59 year old white female with history of asthma, nicotine abuse presents with shortness of breath, wheezing, lower extremity swelling.  1.  Acute respiratory failure due to acute chronic obstructive pulmonary disease flare as well as acute congestive heart failure, type unknown. At this time, we will treat her congestive heart failure with IV Lasix, echocardiogram and a cardiology evaluation. In terms of her acute asthma/chronic obstructive pulmonary disease flare, we will treat her with nebulizers, IV Solu-Medrol, Levaquin and Mucinex.  2.  Nicotine addiction. Smoking cessation counseling done, 4 minutes spent. I strongly recommended she stop smoking. I started her on a nicotine patch. 3.  Morbid obesity. Recommend the patient to lose weight.  4.  Miscellaneous. The patient will be on Lovenox for deep vein thrombosis prophylaxis.   TIME SPENT: Fifty-five minutes on this patient.    ____________________________ Lafonda Mosses. Posey Pronto, MD shp:TT D: 12/23/2013 15:01:14 ET T: 12/23/2013 15:16:33 ET JOB#: [id number]  cc: Jarid Sasso H. Posey Pronto, MD, <Dictator> Alric Seton MD ELECTRONICALLY SIGNED 12/27/2013 8:41

## 2014-08-02 NOTE — Discharge Summary (Signed)
PATIENT NAME:  Kelly Hale, Kelly Hale MR#:  [id number] DATE OF BIRTH:  Mar 02, 1956  DATE OF ADMISSION:  12/23/2013 DATE OF DISCHARGE:  12/25/2013  For a detailed note, please see the history and physical done on admission by Dr. Dustin Flock.   DIAGNOSES AT DISCHARGE AS FOLLOWS: Chronic obstructive pulmonary disease exacerbation. Congestive heart failure, chronic diastolic in nature, tobacco abuse.   DIET:  The patient is being discharged on a regular diet.   ACTIVITY: As tolerated.   FOLLOWUP: With Dr. Harlan Stains in the next 1 to 2 weeks.    DISCHARGE MEDICATIONS:  Albuterol inhaler 2 puffs 4 times daily as needed, Advair 500/50 one puff b.i.d., Spiriva 1 puff daily, nicotine patch 21 mg transdermal daily, Lasix 20 mg daily, prednisone taper starting at 60 mg down to 10 mg over the next 6 days, oxygen 2 liters nasal cannula continuously, Levaquin 500 milligrams daily x5 days.   IMAGING DURING THE HOSPITAL COURSE: Chest x-ray on admission showing mild cardiomegaly with evidence of mild vascular congestion.   HOSPITAL COURSE:  This is a 59 year old female who presented to the hospital with shortness of breath and noted to be in acute respiratory failure, likely secondary to a combination of COPD exacerbation and mild CHF.   1.  Acute respiratory failure. This was likely secondary to a combination of mild CHF and COPD. The patient was given some IV Lasix, also started on IV steroids, around-the-clock nebulizer treatments, maintained on Advair, Spiriva was added, and she was given empiric antibiotics. With aggressive therapy, her clinical symptoms have significantly improved. Most likely the cause of the bulk of her symptoms were just COPD. She was ambulated on room air, did desaturate below 88%; therefore, home oxygen was arranged for her prior to discharge. Presently, the patient is being discharged home on her prednisone taper along with Levaquin and Advair and Spiriva as mentioned with home oxygen.   2.  CHF.  This is likely chronic diastolic CHF. The patient was seen by cardiology. She was started on IV diuretics initially, quickly tapered off of them, and started on oral Lasix and she is currently being discharged on that. She is not being discharged on beta-blockers due to her COPD.  3.  Tobacco abuse. The patient was maintained on nicotine patch and she is being discharged on one of those presently.   CODE STATUS: The patient is a full code.   TIME SPENT: Is 35 minutes.  ____________________________ Belia Heman. Verdell Carmine, MD vjs:lr D: 12/25/2013 15:37:07 ET T: 12/25/2013 17:15:43 ET JOB#: [id number]  cc: Belia Heman. Verdell Carmine, MD, <Dictator> Emeline General. Dema Severin, MD Henreitta Leber MD ELECTRONICALLY SIGNED 12/30/2013 9:43

## 2014-08-10 ENCOUNTER — Other Ambulatory Visit: Payer: Self-pay | Admitting: Internal Medicine

## 2014-08-11 ENCOUNTER — Encounter: Payer: 59 | Attending: Family Medicine | Admitting: Respiratory Therapy

## 2014-08-11 DIAGNOSIS — J449 Chronic obstructive pulmonary disease, unspecified: Secondary | ICD-10-CM

## 2014-08-11 DIAGNOSIS — G4730 Sleep apnea, unspecified: Secondary | ICD-10-CM

## 2014-08-11 DIAGNOSIS — J432 Centrilobular emphysema: Secondary | ICD-10-CM | POA: Insufficient documentation

## 2014-08-11 NOTE — Progress Notes (Signed)
Daily Session Note ° °Patient Details  °Name: Salimatou Y Enwright °MRN: [id number] °Date of Birth: 01/30/1956 °Referring Provider:  Mungal, Vishal, MD ° °Encounter Date: 08/11/2014 ° °Check In: °  °  °Session Check In - 08/11/14 1354   ° Check-In  ° Staff Present Laureen Brown BS, RRT, Respiratory Therapist  Carroll Enterkin RN, BSN  ° ER physicians immediately available to respond to emergencies [facility] immediately available ER MD  ° Physician(s) Stafford and Kinner  ° Warm-up and Cool-down Performed on first and last piece of equipment  ° VAD Patient? No  ° Pain Assessment  ° Currently in Pain? No/denies  ° Multiple Pain Sites No  °  ° ° ° ° ° °Goals Met:  °Proper associated with RPD/PD & O2 Sat °Independence with exercise equipment °Using PLB without cueing & demonstrates good technique °Exercise tolerated well ° °Goals Unmet:  °Not Applicable ° °Goals Comments: No progress with smoking cessation; 2-3cig/day. ° ° °Dr. Mark Miller is Medical Director for [facility] and [facility]. °

## 2014-08-13 ENCOUNTER — Encounter: Payer: 59 | Admitting: Respiratory Therapy

## 2014-08-13 DIAGNOSIS — J432 Centrilobular emphysema: Secondary | ICD-10-CM

## 2014-08-13 DIAGNOSIS — G4730 Sleep apnea, unspecified: Secondary | ICD-10-CM

## 2014-08-13 NOTE — Progress Notes (Signed)
Daily Session Note  Patient Details  Name: Kelly Hale MRN: [id number] Date of Birth: 1955-08-28 Referring Provider:  Harlan Stains, MD  Encounter Date: 08/13/2014  Check In:     Session Check In - 08/13/14 1243    Check-In   Staff Present Carson Myrtle BS,RRT, Respiratory Therapist; Lestine Box BS, ACSM EP-C, Exercise Physiologist; Candiss Norse MS, ACSM CEP, Exercise Physiologist   ER physicians immediately available to respond to emergencies [facility] immediately available ER MD   Physician(s) Reita Cliche and Edd Fabian   Warm-up and Cool-down Performed on first and last piece of equipment   VAD Patient? No   Pain Assessment   Currently in Pain? No/denies   Multiple Pain Sites No         Goals Met:  Proper associated with RPD/PD & O2 Sat Independence with exercise equipment Using PLB without cueing & demonstrates good technique Exercise tolerated well  Goals Unmet:  Not Applicable  Goals Comments:    Dr. Emily Filbert is Medical Director for [facility] and [facility].

## 2014-08-15 ENCOUNTER — Encounter: Payer: Self-pay | Admitting: Cardiovascular Disease

## 2014-08-15 ENCOUNTER — Encounter: Payer: 59 | Admitting: *Deleted

## 2014-08-15 ENCOUNTER — Ambulatory Visit (INDEPENDENT_AMBULATORY_CARE_PROVIDER_SITE_OTHER): Payer: 59 | Admitting: Cardiovascular Disease

## 2014-08-15 VITALS — BP 120/78 | HR 99 | Ht 66.0 in | Wt 283.8 lb

## 2014-08-15 DIAGNOSIS — G4733 Obstructive sleep apnea (adult) (pediatric): Secondary | ICD-10-CM

## 2014-08-15 DIAGNOSIS — R0602 Shortness of breath: Secondary | ICD-10-CM | POA: Diagnosis not present

## 2014-08-15 DIAGNOSIS — G4730 Sleep apnea, unspecified: Secondary | ICD-10-CM

## 2014-08-15 DIAGNOSIS — J432 Centrilobular emphysema: Secondary | ICD-10-CM

## 2014-08-15 DIAGNOSIS — F17200 Nicotine dependence, unspecified, uncomplicated: Secondary | ICD-10-CM

## 2014-08-15 DIAGNOSIS — Z720 Tobacco use: Secondary | ICD-10-CM

## 2014-08-15 DIAGNOSIS — I5030 Unspecified diastolic (congestive) heart failure: Secondary | ICD-10-CM | POA: Diagnosis not present

## 2014-08-15 DIAGNOSIS — Z9989 Dependence on other enabling machines and devices: Secondary | ICD-10-CM

## 2014-08-15 NOTE — Patient Instructions (Addendum)
You are doing well.  Please take lasix as needed for shortness of breath Consider at least 2 to 3 times a week  Please call us if you have new issues that need to be addressed before your next appt.  Your physician wants you to follow-up in: 6 months.  You will receive a reminder letter in the mail two months in advance. If you don't receive a letter, please call our office to schedule the follow-up appointment.

## 2014-08-15 NOTE — Progress Notes (Signed)
Patient ID: Kelly Hale, female    DOB: 1955-09-23, 59 y.o.   MRN: [id number]  HPI Comments: Kelly Hale is a 59 year old woman with morbid obesity, long smoking history, severe COPD, hypertension, obstructive sleep apnea on CPAP, chronic diastolic CHF, previous hospitalization 12/23/2013 at [facility] for acute respiratory failure with acute on chronicdiastolic CHF. She received antibiotics, prednisone taper, aggressive diuresis with improvement of her symptoms. She presents for routine follow-up of her chronic diastolic CHF  In follow-up today, she reports that her weight is about the same. She has worsening shortness of breath and Advair was changed to Symbicort. She denies any worsening of her lower extremity edema.  Review of her weight actually shows a 16 pound weight gain from her prior clinic visit November 2015.  She is down to 2 cigarettes per day, participating in cardiac rehabilitation. She does not take Lasix, only sparingly. Pulmonary issues managed by Dr. Stevenson Clinch.   EKG on today's visit shows normal sinus rhythm with rate 99 bpm, no significant ST or T-wave changes  Lab work reviewed with her showing TSH 0.85, normal renal function, creatinine 0.70, total cholesterol 227, LDL 136  Echocardiogram results reviewed from 12/23/2013 showing normal ejection fraction, diastolic dysfunction, normal right heart size and function, normal right ventricular systolic pressure  EKG shows normal sinus rhythm with rate 90 bpm, no significant ST or T-wave changes BNP on arrival in the hospital was 2300, normal cardiac enzymes, TSH 0.7, total cholesterol 128, LD 65, hemoglobin A1c 6.0  In terms of her social history, she is married, works in Scientist, research (life sciences) and receiving at [employer], smokes 1 pack per day for 30 years who still smokes, no alcohol  In terms of her family history mother's father had asthma, grandmother with heart disease with repair, also diabetes in the family,no significant coronary  artery disease    Allergies  Allergen Reactions  . Aspirin     REACTION: intol-asthma    Current Outpatient Prescriptions on File Prior to Visit  Medication Sig Dispense Refill  . albuterol (PROVENTIL) (2.5 MG/3ML) 0.083% nebulizer solution Take 3 mLs (2.5 mg total) by nebulization every 6 (six) hours as needed for wheezing or shortness of breath. 100 vial 11  . amLODipine (NORVASC) 5 MG tablet Take 1 tablet by mouth daily.    . furosemide (LASIX) 20 MG tablet Take 1 tablet (20 mg total) by mouth daily. 90 tablet 3  . HYDROcodone-homatropine (HYCODAN) 5-1.5 MG/5ML syrup 5 ml every 6 hours if needed for cough 200 mL 0  . Multiple Vitamin (MULTIVITAMIN) capsule Take 1 capsule by mouth daily.    . nicotine (NICODERM CQ - DOSED IN MG/24 HOURS) 21 mg/24hr patch Place 1 patch (21 mg total) onto the skin daily. 28 patch 1  . OXYGEN Inhale 2 L into the lungs daily. 2 liters daily    . Vitamin D, Ergocalciferol, (DRISDOL) 50000 UNITS CAPS capsule Take 1 capsule by mouth once a week.    . Fluticasone-Salmeterol (ADVAIR) 500-50 MCG/DOSE AEPB Inhale 1 puff into the lungs every 12 (twelve) hours.     No current facility-administered medications on file prior to visit.    Past Medical History  Diagnosis Date  . Asthma   . Hyperlipidemia   . Aspirin allergy   . Morbid obesity   . Tobacco abuse   . CHF (congestive heart failure)     Past Surgical History  Procedure Laterality Date  . Cesarean section    . Tubal ligation    .  Knee arthroscopy      right    Social History  reports that she has been smoking Cigarettes.  She has a 30 pack-year smoking history. She has never used smokeless tobacco. She reports that she does not drink alcohol or use illicit drugs.  Family History family history includes Alzheimer's disease in her maternal grandmother; Asthma in her mother; Diabetes in her paternal grandfather; Hyperlipidemia in her maternal grandfather and mother; Hypertension in her  maternal grandfather, maternal grandmother, and mother; Other in her father, maternal uncle, and mother; Stroke in her maternal grandfather.   Review of Systems  Constitutional: Negative.   Respiratory: Positive for shortness of breath and wheezing.   Cardiovascular: Positive for leg swelling.  Gastrointestinal: Negative.   Endocrine: Negative.   Musculoskeletal: Negative.   Neurological: Negative.   Psychiatric/Behavioral: Negative.   All other systems reviewed and are negative.   BP 120/78 mmHg  Pulse 99  Ht 5\' 6"  (1.676 m)  Wt 283 lb 12 oz (128.708 kg)  BMI 45.82 kg/m2  Physical Exam  Constitutional: She is oriented to person, place, and time. She appears well-developed and well-nourished.  obese  HENT:  Head: Normocephalic.  Nose: Nose normal.  Mouth/Throat: Oropharynx is clear and moist.  Eyes: Conjunctivae are normal. Pupils are equal, round, and reactive to light.  Neck: Normal range of motion. Neck supple. No JVD present.  Cardiovascular: Normal rate, regular rhythm, S1 normal, S2 normal, normal heart sounds and intact distal pulses.  Exam reveals no gallop and no friction rub.   No murmur heard. Pulmonary/Chest: Effort normal. No respiratory distress. She has decreased breath sounds. She has wheezes. She has no rales. She exhibits no tenderness.  Abdominal: Soft. Bowel sounds are normal. She exhibits no distension. There is no tenderness.  Musculoskeletal: Normal range of motion. She exhibits no edema or tenderness.  Lymphadenopathy:    She has no cervical adenopathy.  Neurological: She is alert and oriented to person, place, and time. Coordination normal.  Skin: Skin is warm and dry. No rash noted. No erythema.  Psychiatric: She has a normal mood and affect. Her behavior is normal. Judgment and thought content normal.    Assessment and Plan  Nursing note and vitals reviewed.

## 2014-08-15 NOTE — Assessment & Plan Note (Signed)
High risk of acute on chronic diastolic CHF given her obesity/size. Recommended she take Lasix at least twice if not 3 times per week and as needed for worsening shortness of breath. May require periodic BMP to monitor her kidneys. She does not want potassium pill, she will take a banana

## 2014-08-15 NOTE — Assessment & Plan Note (Signed)
She reports compliance with her CPAP. Recommended weight loss

## 2014-08-15 NOTE — Progress Notes (Signed)
Daily Session Note  Patient Details  Name: Kelly Hale MRN: [id number] Date of Birth: Feb 10, 1956 Referring Provider:  Vilinda Boehringer, MD  Encounter Date: 08/15/2014  Check In:     Session Check In - 08/15/14 1228    Check-In   Staff Present Gerlene Burdock RN, BSN;Dyanna Seiter Dillard Essex MS, ACSM CEP Exercise Physiologist;Stacey Blanch Media RRT, RCP Respiratory Therapist   ER physicians immediately available to respond to emergencies [facility] immediately available ER MD   Physician(s) Karma Greaser and Lorelei Pont and Cool-down Performed on first and last piece of equipment   VAD Patient? No   Pain Assessment   Currently in Pain? No/denies   Multiple Pain Sites No         Goals Met:  Proper associated with RPD/PD & O2 Sat Independence with exercise equipment Using PLB without cueing & demonstrates good technique Exercise tolerated well  Goals Unmet:  Not Applicable  Goals Comments:    Dr. Emily Filbert is Medical Director for [place] and [facility].

## 2014-08-15 NOTE — Assessment & Plan Note (Signed)
She would benefit from meeting with a dietitian. Perhaps this can be done through pulmonary rehabilitation

## 2014-08-15 NOTE — Assessment & Plan Note (Signed)
We have stressed importance of smoking cessation She reports increasing shortness of breath since changed from her Advair to Symbicort. This change was made one month ago. We have stressed that she needs to be taking her Lasix at least periodically. She has 16 pound weight gain, unable to tell if this is from eating/lack of exercise or fluid. Significant wheezing, expiratory, on exam today. Recommended she take Lasix for the next several days. If shortness of breath symptoms persist, may need prednisone taper

## 2014-08-15 NOTE — Assessment & Plan Note (Signed)
She reports that she has almost stopped smoking. Stressed the importance of smoking cessation

## 2014-08-18 ENCOUNTER — Encounter: Payer: 59 | Admitting: *Deleted

## 2014-08-18 DIAGNOSIS — G4730 Sleep apnea, unspecified: Secondary | ICD-10-CM

## 2014-08-18 DIAGNOSIS — J449 Chronic obstructive pulmonary disease, unspecified: Secondary | ICD-10-CM

## 2014-08-18 DIAGNOSIS — J432 Centrilobular emphysema: Secondary | ICD-10-CM | POA: Diagnosis not present

## 2014-08-18 NOTE — Progress Notes (Signed)
Daily Session Note  Patient Details  Name: KINSLEI LABINE MRN: [id number] Date of Birth: Dec 26, 1955 Referring Provider:  Harlan Stains, MD  Encounter Date: 08/18/2014  Check In:     Session Check In - 08/18/14 1140    Check-In   Staff Present Laureen Owens Shark BS, RRT, Respiratory Therapist;Keerthi Hazell RN, BSN;Steven Way BS, ACSM EP-C, Exercise Physiologist   ER physicians immediately available to respond to emergencies [facility] immediately available ER MD   Physician(s) Dr. Claudette Head and Dr. Jimmye Norman.    Warm-up and Cool-down Performed on first and last piece of equipment   VAD Patient? No   Pain Assessment   Currently in Pain? No/denies         Goals Met:  Proper associated with RPD/PD & O2 Sat Exercise tolerated well  Goals Unmet:  Not Applicable  Goals Comments:    Dr. Emily Filbert is Medical Director for [place] and [facility].

## 2014-08-18 NOTE — Addendum Note (Signed)
Addended by: Karie Fetch on: 08/18/2014 03:22 PM   Modules accepted: Medications

## 2014-08-18 NOTE — Progress Notes (Signed)
Daily Session Note ° °Patient Details  °Name: Kelly Hale °MRN: [id number] °Date of Birth: 03/10/1956 °Referring Provider:  White, Cynthia, MD ° °Encounter Date: 08/18/2014 ° °Check In: °  °  °Session Check In - 08/18/14 1140   ° Check-In  ° Staff Present Laureen Brown BS, RRT, Respiratory Therapist;Carroll Enterkin RN, BSN;Steven Way BS, ACSM EP-C, Exercise Physiologist  ° ER physicians immediately available to respond to emergencies [facility] immediately available ER MD  ° Physician(s) Dr. Schaevit and Dr. Williams.   ° Warm-up and Cool-down Performed on first and last piece of equipment  ° VAD Patient? No  ° Pain Assessment  ° Currently in Pain? No/denies  °  ° ° ° °  °  °Exercise Prescription Changes - 08/18/14 1200   ° Recumbant Elliptical  ° Level 3  ° RPM 45  °  ° ° °Goals Met:  °Proper associated with RPD/PD & O2 Sat °Independence with exercise equipment °Using PLB without cueing & demonstrates good technique °Exercise tolerated well ° °Goals Unmet:  °Not Applicable ° °Goals Comments: Ms Mallinger has a medication change from Dr Gollan - she is to take her furosemide, 20mg, every other day.  Her weight today was 6lbs lower. ° ° °Dr. Mark Miller is Medical Director for [facility] and [facility]. °

## 2014-08-22 ENCOUNTER — Encounter: Payer: 59 | Admitting: *Deleted

## 2014-08-22 DIAGNOSIS — J432 Centrilobular emphysema: Secondary | ICD-10-CM | POA: Diagnosis not present

## 2014-08-22 DIAGNOSIS — G4730 Sleep apnea, unspecified: Secondary | ICD-10-CM

## 2014-08-22 NOTE — Progress Notes (Signed)
Daily Session Note  Patient Details  Name: Kelly Hale MRN: [id number] Date of Birth: 03/30/1956 Referring Provider:  White, Cynthia, MD  Encounter Date: 08/22/2014  Check In:     Session Check In - 08/22/14 1156    Check-In   Staff Present Stacey Joyce RRT, RCP Respiratory Therapist;Renee MacMillan MS, ACSM CEP Exercise Physiologist;Carroll Enterkin RN, BSN   ER physicians immediately available to respond to emergencies [facility] immediately available ER MD   Physician(s) Dr. Paduchowski and Dr. Quale   Medication changes reported     No   Fall or balance concerns reported    No   Warm-up and Cool-down Performed on first and last piece of equipment   VAD Patient? No   Pain Assessment   Currently in Pain? No/denies         Goals Met:  Proper associated with RPD/PD & O2 Sat Exercise tolerated well  Goals Unmet:  Not Applicable  Goals Comments:   Dr. Mark Miller is Medical Director for [facility] and [facility]. 

## 2014-08-27 DIAGNOSIS — J432 Centrilobular emphysema: Secondary | ICD-10-CM | POA: Diagnosis not present

## 2014-08-27 DIAGNOSIS — G4733 Obstructive sleep apnea (adult) (pediatric): Secondary | ICD-10-CM

## 2014-08-27 NOTE — Progress Notes (Signed)
Daily Session Note  Patient Details  Name: Kelly Hale MRN: [id number] Date of Birth: 1956-03-02 Referring Provider:  Harlan Stains, MD  Encounter Date: 08/27/2014  Check In:     Session Check In - 08/27/14 1215    Check-In   Staff Present Carson Myrtle BS, RRT, Respiratory Therapist;Carroll Enterkin RN, BSN;Nadine Ryle BS, ACSM EP-C, Exercise Physiologist   ER physicians immediately available to respond to emergencies [facility] immediately available ER MD   Physician(s) gayle and williams   Medication changes reported     No   Fall or balance concerns reported    No   Warm-up and Cool-down Performed on first and last piece of equipment   VAD Patient? No   Pain Assessment   Currently in Pain? No/denies         Goals Met:  Proper associated with RPD/PD & O2 Sat Exercise tolerated well No report of cardiac concerns or symptoms Strength training completed today  Goals Unmet:  Not Applicable  Goals Comments:    Dr. Emily Filbert is Medical Director for [place] and [facility].

## 2014-08-29 ENCOUNTER — Encounter: Payer: 59 | Admitting: Respiratory Therapy

## 2014-08-29 DIAGNOSIS — G4733 Obstructive sleep apnea (adult) (pediatric): Secondary | ICD-10-CM

## 2014-08-29 DIAGNOSIS — J449 Chronic obstructive pulmonary disease, unspecified: Secondary | ICD-10-CM

## 2014-08-29 DIAGNOSIS — J432 Centrilobular emphysema: Secondary | ICD-10-CM | POA: Diagnosis not present

## 2014-08-29 NOTE — Progress Notes (Unsigned)
Daily Session Note  Patient Details  Name: TONIA AVINO MRN: [id number] Date of Birth: 04/18/1955 Referring Provider:  Harlan Stains, MD  Encounter Date: 08/29/2014  Check In:     Session Check In - 08/29/14 1252    Check-In   Staff Present Frederich Cha RRT, RCP Respiratory Therapist;Carroll Enterkin RN, Drusilla Kanner MS, ACSM CEP Exercise Physiologist   ER physicians immediately available to respond to emergencies [facility] immediately available ER MD   Physician(s) Dr. Reita Cliche and Dr. Corky Downs   Medication changes reported     No   Fall or balance concerns reported    No   Warm-up and Cool-down Performed on first and last piece of equipment   VAD Patient? No   Pain Assessment   Currently in Pain? No/denies         Goals Met:  Proper associated with RPD/PD & O2 Sat Independence with exercise equipment Exercise tolerated well  Goals Unmet:  Not Applicable  Goals Comments:    Dr. Emily Filbert is Medical Director for [place] and [facility].

## 2014-09-01 ENCOUNTER — Encounter: Payer: 59 | Admitting: *Deleted

## 2014-09-01 DIAGNOSIS — J449 Chronic obstructive pulmonary disease, unspecified: Secondary | ICD-10-CM

## 2014-09-01 DIAGNOSIS — J432 Centrilobular emphysema: Secondary | ICD-10-CM | POA: Diagnosis not present

## 2014-09-01 NOTE — Progress Notes (Signed)
Pulmonary Individual Treatment Plan  Patient Details  Name: Kelly Hale MRN: [id number] Date of Birth: 01/28/1956 Referring Provider:  Harlan Stains, MD  Initial Encounter Date:    Visit Diagnosis: Chronic obstructive pulmonary disease  Patient's Home Medications on Admission:  Current outpatient prescriptions:  .  albuterol (PROVENTIL) (2.5 MG/3ML) 0.083% nebulizer solution, Take 3 mLs (2.5 mg total) by nebulization every 6 (six) hours as needed for wheezing or shortness of breath., Disp: 100 vial, Rfl: 11 .  amLODipine (NORVASC) 5 MG tablet, Take 1 tablet by mouth daily., Disp: , Rfl:  .  budesonide-formoterol (SYMBICORT) 160-4.5 MCG/ACT inhaler, Inhale 2 puffs into the lungs 2 (two) times daily., Disp: , Rfl:  .  Fluticasone-Salmeterol (ADVAIR) 500-50 MCG/DOSE AEPB, Inhale 1 puff into the lungs every 12 (twelve) hours., Disp: , Rfl:  .  furosemide (LASIX) 20 MG tablet, Take 1 tablet (20 mg total) by mouth daily. (Patient taking differently: Take 20 mg by mouth every other day. ), Disp: 90 tablet, Rfl: 3 .  HYDROcodone-homatropine (HYCODAN) 5-1.5 MG/5ML syrup, 5 ml every 6 hours if needed for cough, Disp: 200 mL, Rfl: 0 .  Multiple Vitamin (MULTIVITAMIN) capsule, Take 1 capsule by mouth daily., Disp: , Rfl:  .  nicotine (NICODERM CQ - DOSED IN MG/24 HOURS) 21 mg/24hr patch, Place 1 patch (21 mg total) onto the skin daily., Disp: 28 patch, Rfl: 1 .  OXYGEN, Inhale 2 L into the lungs daily. 2 liters daily, Disp: , Rfl:  .  Vitamin D, Ergocalciferol, (DRISDOL) 50000 UNITS CAPS capsule, Take 1 capsule by mouth once a week., Disp: , Rfl:   Past Medical History: Past Medical History  Diagnosis Date  . Asthma   . Hyperlipidemia   . Aspirin allergy   . Morbid obesity   . Tobacco abuse   . CHF (congestive heart failure)     Tobacco Use: History  Smoking status  . Current Every Day Smoker -- 1.00 packs/day for 30 years  . Types: Cigarettes  Smokeless tobacco  . Never Used     Comment: quit in Sept 2015, but started back smoking up to 2 cigs per day    Labs: Recent Review Flowsheet Data    There is no flowsheet data to display.       ADL UCSD:     ADL UCSD      06/24/14 1130 06/24/14 1458     ADL UCSD   ADL Phase Entry Mid    SOB Score total 45 64    Rest 0 0    Walk 1 1    Stairs 3 5    Bath 3 3    Dress 3 3    Shop 2 2        Pulmonary Function Assessment:   Exercise Target Goals:    Exercise Program Goal: Individual exercise prescription set with THRR, safety & activity barriers. Participant demonstrates ability to understand and report RPE using BORG scale, to self-measure pulse accurately, and to acknowledge the importance of the exercise prescription.  Exercise Prescription Goal: Starting with aerobic activity 30 plus minutes a day, 3 days per week for initial exercise prescription. Provide home exercise prescription and guidelines that participant acknowledges understanding prior to discharge.  Activity Barriers & Risk Stratification:   6 Minute Walk:     6 Minute Walk      06/24/14 1215 08/22/14 1300     6 Minute Walk   Phase Initial Mid Program    Distance 1160  feet 1015 feet    Walk Time 6 minutes 6 minutes    Resting HR 98 bpm 105 bpm    Resting BP 138/80 mmHg 152/88 mmHg    Max Ex. HR 129 bpm 116 bpm    Max Ex. BP 154/88 mmHg 156/88 mmHg    RPE 12 11    Perceived Dyspnea  3 3.5    Symptoms  No       Initial Exercise Prescription:   Exercise Prescription Changes:     Exercise Prescription Changes      08/18/14 1200 09/01/14 1000 09/01/14 1300       Exercise Review   Progression  Yes Yes     Response to Exercise   Blood Pressure (Admit)  100/80 mmHg 100/80 mmHg     Blood Pressure (Exercise)  142/80 mmHg 142/80 mmHg     Blood Pressure (Exit)  134/80 mmHg 134/80 mmHg     Heart Rate (Admit)   109 bpm     Heart Rate (Exercise)   120 bpm     Heart Rate (Exit)   106 bpm     Oxygen Saturation (Admit)    93 %     Oxygen Saturation (Exercise)   90 %     Oxygen Saturation (Exit)   94 %     Rating of Perceived Exertion (Exercise)   12     Perceived Dyspnea (Exercise)   3     Intensity  THRR unchanged THRR unchanged     Progression  Continue progressive overload as per policy without signs/symptoms or physical distress. Continue progressive overload as per policy without signs/symptoms or physical distress.     Resistance Training   Training Prescription  Yes Yes     Weight  3 3     Reps  10-12 10-12     Treadmill   MPH  2.2 2.2     Grade  1 1     Minutes  15 15     NuStep   Level  5 5     Watts  55 55     Minutes  15 15     Recumbant Elliptical   Level $Remo'3 3 3     'ardUV$ RPM 45 45 45     Minutes  15 15        Discharge Exercise Prescription:    Nutrition:  Target Goals: Understanding of nutrition guidelines, daily intake of sodium '1500mg'$ , cholesterol '200mg'$ , calories 30% from fat and 7% or less from saturated fats, daily to have 5 or more servings of fruits and vegetables.  Biometrics:    Nutrition Therapy Plan and Nutrition Goals:   Nutrition Discharge: Rate Your Plate Scores:   Psychosocial: Target Goals: Acknowledge presence or absence of depression, maximize coping skills, provide positive support system. Participant is able to verbalize types and ability to use techniques and skills needed for reducing stress and depression.  Initial Review & Psychosocial Screening:   Quality of Life Scores:   PHQ-9:     Recent Review Flowsheet Data    There is no flowsheet data to display.      Psychosocial Evaluation and Intervention:     Psychosocial Evaluation - 08/18/14 1243    Psychosocial Evaluation & Interventions   Interventions Stress management education;Relaxation education;Encouraged to exercise with the program and follow exercise prescription   Comments Counselor met with Kelly Hale today for psychosocial evaluation.  She is a 59 year old female who is  struggling with  COPD and congestive Heart Failure.  She has a strong support system with a spouse of 13 years and adult children and a mother who lives close by.  Kelly Hale also is actively involved in her church community.  She reports that she sleeps fairly well with 3/7 nights problems subsequent to issues with her CPAP machine mask.  She also denies a history of depression or depressive symptoms and states she is typically in a positive mood.  Her primary stressors are finances currently with all the building medical bills.  Kelly Hale goals for this program are to lose weight, breathe better and consistently exercise.  Counselor will follow with her as needed.     Continued Psychosocial Services Needed Yes  Kelly Hale will benefit from the psychoeducational components of this program, particularly stress management.  Also, meeting with the dietician to accomplish her weight loss goals is recommended.      Psychosocial Re-Evaluation:  Education: Education Goals: Education classes will be provided on a weekly basis, covering required topics. Participant will state understanding/return demonstration of topics presented.  Learning Barriers/Preferences:   Education Topics: Initial Evaluation Education: - Verbal, written and demonstration of respiratory meds, RPE/PD scales, oximetry and breathing techniques. Instruction on use of nebulizers and MDIs: cleaning and proper use, rinsing mouth with steroid doses and importance of monitoring MDI activations.   General Nutrition Guidelines/Fats and Fiber: -Group instruction provided by verbal, written material, models and posters to present the general guidelines for heart healthy nutrition. Gives an explanation and review of dietary fats and fiber.   Controlling Sodium/Reading Food Labels: -Group verbal and written material supporting the discussion of sodium use in heart healthy nutrition. Review and explanation with models, verbal and written materials for  utilization of the food label.   Exercise Physiology & Risk Factors: - Group verbal and written instruction with models to review the exercise physiology of the cardiovascular system and associated critical values. Details cardiovascular disease risk factors and the goals associated with each risk factor.   Aerobic Exercise & Resistance Training: - Gives group verbal and written discussion on the health impact of inactivity. On the components of aerobic and resistive training programs and the benefits of this training and how to safely progress through these programs.   Flexibility, Balance, General Exercise Guidelines: - Provides group verbal and written instruction on the benefits of flexibility and balance training programs. Provides general exercise guidelines with specific guidelines to those with heart or lung disease. Demonstration and skill practice provided.   Stress Management: - Provides group verbal and written instruction about the health risks of elevated stress, cause of high stress, and healthy ways to reduce stress.   Depression: - Provides group verbal and written instruction on the correlation between heart/lung disease and depressed mood, treatment options, and the stigmas associated with seeking treatment.   Exercise & Equipment Safety: - Individual verbal instruction and demonstration of equipment use and safety with use of the equipment.   Infection Prevention: - Provides verbal and written material to individual with discussion of infection control including proper hand washing and proper equipment cleaning during exercise session.   Falls Prevention: - Provides verbal and written material to individual with discussion of falls prevention and safety.   Diabetes: - Individual verbal and written instruction to review signs/symptoms of diabetes, desired ranges of glucose level fasting, after meals and with exercise. Advice that pre and post exercise glucose  checks will be done for 3 sessions at entry of program.  Chronic Lung Diseases: - Group verbal and written instruction to review new updates, new respiratory medications, new advancements in procedures and treatments. Provide informative websites and "800" numbers of self-education.   Lung Procedures: - Group verbal and written instruction to describe testing methods done to diagnose lung disease. Review the outcome of test results. Describe the treatment choices: Pulmonary Function Tests, ABGs and oximetry.   Energy Conservation: - Provide group verbal and written instruction for methods to conserve energy, plan and organize activities. Instruct on pacing techniques, use of adaptive equipment and posture/positioning to relieve shortness of breath.   Triggers: - Group verbal and written instruction to review types of environmental controls: home humidity, furnaces, filters, dust mite/pet prevention, HEPA vacuums. To discuss weather changes, air quality and the benefits of nasal washing.   Exacerbations: - Group verbal and written instruction to provide: warning signs, infection symptoms, calling MD promptly, preventive modes, and value of vaccinations. Review: effective airway clearance, coughing and/or vibration techniques. Create an Sports administrator.   Oxygen: - Individual and group verbal and written instruction on oxygen therapy. Includes supplement oxygen, available portable oxygen systems, continuous and intermittent flow rates, oxygen safety, concentrators, and Medicare reimbursement for oxygen.   Respiratory Medications: - Group verbal and written instruction to review medications for lung disease. Drug class, frequency, complications, importance of spacers, rinsing mouth after steroid MDI's, and proper cleaning methods for nebulizers.   AED/CPR: - Group verbal and written instruction with the use of models to demonstrate the basic use of the AED with the basic ABC's of  resuscitation.   Breathing Retraining: - Provides individuals verbal and written instruction on purpose, frequency, and proper technique of diaphragmatic breathing and pursed-lipped breathing. Applies individual practice skills.   Anatomy and Physiology of the Lungs: - Group verbal and written instruction with the use of models to provide basic lung anatomy and physiology related to function, structure and complications of lung disease.   Heart Failure: - Group verbal and written instruction on the basics of heart failure: signs/symptoms, treatments, explanation of ejection fraction, enlarged heart and cardiomyopathy.   Sleep Apnea: - Individual verbal and written instruction to review Obstructive Sleep Apnea. Review of risk factors, methods for diagnosing and types of masks and machines for OSA.   Anxiety: - Provides group, verbal and written instruction on the correlation between heart/lung disease and anxiety, treatment options, and management of anxiety.   Relaxation: - Provides group, verbal and written instruction about the benefits of relaxation for patients with heart/lung disease. Also provides patients with examples of relaxation techniques.   Knowledge Questionnaire Score:   Personal Goals and Risk Factors at Admission:     Personal Goals and Risk Factors at Admission - 06/24/14 1130    Personal Goals and Risk Factors on Admission    Weight Management Yes   Intervention Learn and follow the exercise and diet guidelines while in the program. Utilize the nutrition and education classes to help gain knowledge of the diet and exercise expectations in the program   Admit Weight 275 lb (124.739 kg)   Goal Weight 160 lb (72.576 kg)   Increase Aerobic Exercise and Physical Activity Yes   Intervention While in program, learn and follow the exercise prescription taught. Start at a low level workload and increase workload after able to maintain previous level for 30 minutes.  Increase time before increasing intensity.   Quit Smoking Yes   Intervention Utilize your health care professional team to help with smoking cessation while in  the program. Your doctor can prescribe medications to aid in cessation. The program can provide information and counseling as needed.   Understand more about Heart/Pulmonary Disease. Yes   Intervention While in program utilize professionals for any questions, and attend the education sessions. Great websites to use are www.americanheart.org or www.lung.org for reliable information.   Improve shortness of breath with ADL's Yes   Intervention While in program, learn and follow the exercise prescription taught. Start at a low level workload and increase workload ad advised by the exercise physiologist. Increase time before increasing intensity.   Develop more efficient breathing techniques such as purse lipped breathing and diaphragmatic breathing; and practicing self-pacing with activity Yes   Intervention While in program, learn and utilize the specific breathing techniques taught to you. Continue to practice and use the techniques as needed.   Increase knowledge of respiratory medications and ability to use respiratory devices properly.  Yes   Intervention While in program learn and demonstrate appropriate use of your oxygen therapy by increasing flow with exertion, manage oxygen tank operation, including continuous and intermittent flow.  Understanding oxygen is a drug ordered by your physician.;While in program, learn to administer MDI, nebulizer, and spacer properly.;Learn to take respiratory medicine as ordered.;While in program, learn to Clean MDI, nebulizers, and spacers properly.      Personal Goals and Risk Factors Review:      Goals and Risk Factor Review      08/27/14 1130           Quit Smoking   Comments 2-3cig/day; she smokes randomly during the day to help with cessation of smoking.          Personal Goals Discharge:       Personal Goals at Discharge - 09/01/14 1251    Quit Smoking   Comments Kelly Hale is still smoking 2-3 cigarettes per day and is not currently interested in setting a Quit Date. She was educated on using sugar free candies or cinnamon gum today in order to help manage oral cravings and was very willing to try this. She is still using patches for NRT.  She also really wants to get back to painting but usually does so while she smokes. Educated on  how changing current routines can aid in quitting and she can use painting as a positive reward for being smoke free.  Kelly Hale will reconsider quit date after next pulmonologist appt.       Comments: 30 day review

## 2014-09-01 NOTE — Progress Notes (Signed)
Daily Session Note  Patient Details  Name: Kelly Hale MRN: [id number] Date of Birth: [birth year] Referring Provider:  Harlan Stains, MD  Encounter Date: 09/01/2014  Check In:     Session Check In - 09/01/14 1249    Check-In   Staff Present Frederich Balding MPH, CHES;Genavie Boettger RN, BSN, CCRP;Carroll Enterkin RN, BSN;Laureen [employer], RRT, Respiratory Therapist   ER physicians immediately available to respond to emergencies [facility] immediately available ER MD   Physician(s) Dr. Corky Downs Dr. Reita Cliche   Medication changes reported     No   Fall or balance concerns reported    No   Warm-up and Cool-down Performed on first and last piece of equipment   VAD Patient? No   Pain Assessment   Currently in Pain? Other (Comment)  No change in intermittent chronic pain           Exercise Prescription Changes - 09/01/14 1300    Exercise Review   Progression Yes   Response to Exercise   Blood Pressure (Admit) 100/80 mmHg   Blood Pressure (Exercise) 142/80 mmHg   Blood Pressure (Exit) 134/80 mmHg   Heart Rate (Admit) 109 bpm   Heart Rate (Exercise) 120 bpm   Heart Rate (Exit) 106 bpm   Oxygen Saturation (Admit) 93 %   Oxygen Saturation (Exercise) 90 %   Oxygen Saturation (Exit) 94 %   Rating of Perceived Exertion (Exercise) 12   Perceived Dyspnea (Exercise) 3   Intensity THRR unchanged   Progression Continue progressive overload as per policy without signs/symptoms or physical distress.   Resistance Training   Training Prescription Yes   Weight 3   Reps 10-12   Treadmill   MPH 2.2   Grade 1   Minutes 15   NuStep   Level 5   Watts 55   Minutes 15   Recumbant Elliptical   Level 3   RPM 45   Minutes 15      Goals Met:  Exercise tolerated well Strength training completed today  Goals Unmet:  Not Applicable  Goals Comments:    Dr. Emily Filbert is Medical Director for [place] and [facility].

## 2014-09-01 NOTE — Progress Notes (Signed)
Daily Session Note  Patient Details  Name: Kelly Hale MRN: [id number] Date of Birth: 1955-11-12 Referring Provider:  Harlan Stains, MD  Encounter Date: 09/01/2014  Check In:     Session Check In - 09/01/14 1249    Check-In   Staff Present Frederich Balding MPH, CHES;Susanne Bice RN, BSN, CCRP;Lacheryl Niesen RN, BSN;[employer]ure[employer], RRT, Respiratory Therapist   ER physicians immediately available to respond to emergencies [facility] immediately available ER MD   Physician(s) Dr. Corky Downs Dr. Reita Cliche   Medication changes reported     No   Fall or balance concerns reported    No   Warm-up and Cool-down Performed on first and last piece of equipment   VAD Patient? No   Pain Assessment   Currently in Pain? Other (Comment)  No change in intermittent chronic pain           Exercise Prescription Changes - 09/01/14 1000    Exercise Review   Progression Yes   Response to Exercise   Blood Pressure (Admit) 100/80 mmHg   Blood Pressure (Exercise) 142/80 mmHg   Blood Pressure (Exit) 134/80 mmHg   Intensity THRR unchanged   Progression Continue progressive overload as per policy without signs/symptoms or physical distress.   Resistance Training   Training Prescription Yes   Weight 3   Reps 10-12   Treadmill   MPH 2.2   Grade 1   Minutes 15   NuStep   Level 5   Watts 55   Minutes 15   Recumbant Elliptical   Level 3   RPM 45   Minutes 15      Goals Met:  Proper associated with RPD/PD & O2 Sat Exercise tolerated well  Goals Unmet:  Not Applicable  Goals Comments:    Dr. Emily Filbert is Medical Director for [place] and [facility].

## 2014-09-05 ENCOUNTER — Telehealth: Payer: Self-pay | Admitting: Internal Medicine

## 2014-09-05 ENCOUNTER — Encounter: Payer: 59 | Admitting: *Deleted

## 2014-09-05 DIAGNOSIS — G4733 Obstructive sleep apnea (adult) (pediatric): Secondary | ICD-10-CM

## 2014-09-05 DIAGNOSIS — J449 Chronic obstructive pulmonary disease, unspecified: Secondary | ICD-10-CM

## 2014-09-05 DIAGNOSIS — J432 Centrilobular emphysema: Secondary | ICD-10-CM | POA: Diagnosis not present

## 2014-09-05 MED ORDER — FLUTICASONE-SALMETEROL 250-50 MCG/DOSE IN AEPB
1.0000 | INHALATION_SPRAY | Freq: Two times a day (BID) | RESPIRATORY_TRACT | Status: DC
Start: 2014-09-05 — End: 2014-10-01

## 2014-09-05 MED ORDER — BUDESONIDE-FORMOTEROL FUMARATE 160-4.5 MCG/ACT IN AERO: 2.0000 | INHALATION_SPRAY | Freq: Two times a day (BID) | RESPIRATORY_TRACT | Status: DC

## 2014-09-05 NOTE — Telephone Encounter (Signed)
Per chart, patient was taking 250/50, not 500/50.  Sent Rx for 250/50.  Patient notified via voicemail.    FYI to Dr. Stevenson Clinch.

## 2014-09-05 NOTE — Telephone Encounter (Addendum)
Called this morning to get Symbicort refilled.  The Symbicort cost over $1000.00 because her insurance will not cover the Symbicort.  She would like to go back on the Advair 500/50.  She said that it worked better for her anyway.  She wants a 90 day supply sent to [facility] today, she said that she is completely out of her medication.   Sent to MW in VM absence - ok to put back on Advair? Please advise.

## 2014-09-05 NOTE — Telephone Encounter (Signed)
3 month supply Symbicort sent to pharmacy. Nothing further needed at this time.

## 2014-09-05 NOTE — Progress Notes (Signed)
Daily Session Note  Patient Details  Name: Kelly Hale MRN: [id number] Date of Birth: 02/25/1956 Referring Provider:  White, Cynthia, MD  Encounter Date: 09/05/2014  Check In:     Session Check In - 09/05/14 1204    Check-In   Staff Present Carroll Enterkin RN, BSN;Renee MacMillan MS, ACSM CEP Exercise Physiologist;Stacey Joyce RRT, RCP Respiratory Therapist   ER physicians immediately available to respond to emergencies [facility] immediately available ER MD   Physician(s) Lord and Kinner   Medication changes reported     No   Fall or balance concerns reported    No   Warm-up and Cool-down Performed on first and last piece of equipment   VAD Patient? No   Pain Assessment   Currently in Pain? No/denies   Multiple Pain Sites No         Goals Met:  Proper associated with RPD/PD & O2 Sat Independence with exercise equipment Using PLB without cueing & demonstrates good technique Exercise tolerated well Personal goals reviewed Strength training completed today  Goals Unmet:  Not Applicable  Goals Comments:   Dr. Mark Miller is Medical Director for [facility] and [facility]. 

## 2014-09-05 NOTE — Telephone Encounter (Signed)
Fine with me > once complete the note send it on to VM to let him know what she wants to do

## 2014-09-10 ENCOUNTER — Encounter: Payer: 59 | Attending: Family Medicine | Admitting: Respiratory Therapy

## 2014-09-10 DIAGNOSIS — G4733 Obstructive sleep apnea (adult) (pediatric): Secondary | ICD-10-CM

## 2014-09-10 DIAGNOSIS — J432 Centrilobular emphysema: Secondary | ICD-10-CM | POA: Diagnosis not present

## 2014-09-10 DIAGNOSIS — J449 Chronic obstructive pulmonary disease, unspecified: Secondary | ICD-10-CM

## 2014-09-10 NOTE — Progress Notes (Signed)
Daily Session Note  Patient Details  Name: Kelly Hale MRN: [id number] Date of Birth: 1955-10-18 Referring Provider:  Harlan Stains, MD  Encounter Date: 09/10/2014  Check In:     Session Check In - 09/10/14 1149    Check-In   Staff Present Frederich Cha RRT, RCP Respiratory Therapist;Austyn Seier RN, Drusilla Kanner MS, ACSM CEP Exercise Physiologist   ER physicians immediately available to respond to emergencies [facility] immediately available ER MD   Physician(s) Dr. Reita Cliche and Dr. Jimmye Norman   Medication changes reported     No   Fall or balance concerns reported    No   Warm-up and Cool-down Performed on first and last piece of equipment   VAD Patient? No   Pain Assessment   Currently in Pain? No/denies         Goals Met:  Proper associated with RPD/PD & O2 Sat Exercise tolerated well  Goals Unmet:  Not Applicable  Goals Comments:    Dr. Emily Filbert is Medical Director for [place] and [facility].

## 2014-09-12 ENCOUNTER — Encounter: Payer: 59 | Admitting: *Deleted

## 2014-09-12 DIAGNOSIS — J432 Centrilobular emphysema: Secondary | ICD-10-CM | POA: Diagnosis not present

## 2014-09-12 NOTE — Progress Notes (Signed)
Daily Session Note  Patient Details  Name: [name] MRN: [id number] Date of Birth: [birth year] Referring Provider:  Harlan Stains, MD  Encounter Date: 09/12/2014  Check In:     Session Check In - 09/12/14 1240    Check-In   Staff Present Laureen Owens Shark BS, RRT, Respiratory Therapist;Carroll Enterkin RN, BSN;Renee Dillard [place] MS, ACSM CEP Exercise Physiologist   ER physicians immediately available to respond to emergencies [facility] immediately available ER MD   Physician(s) Kerman Passey and Thomasene Lot   Medication changes reported     No   Fall or balance concerns reported    No   Warm-up and Cool-down Performed on first and last piece of equipment   VAD Patient? No   Pain Assessment   Currently in Pain? No/denies   Multiple Pain Sites No         Goals Met:  Proper associated with RPD/PD & O2 Sat Independence with exercise equipment Exercise tolerated well Strength training completed today  Goals Unmet:  BP  Goals Comments: BP high today on TM - Ms Marmol had oriental food with high salt content last night; exciting BP at rest was good at 130/78. Exercise on the TM was stopped with BP of 200/96.   Dr. Emily Filbert is Medical Director for [place] and [facility].

## 2014-09-12 NOTE — Progress Notes (Signed)
Daily Session Note  Patient Details  Name: Kelly Hale MRN: [id number] Date of Birth: 1955-12-29 Referring Provider:  Harlan Stains, MD  Encounter Date: 09/12/2014  Check In:     Session Check In - 09/12/14 1240    Check-In   Staff Present Laureen Owens Shark BS, RRT, Respiratory Therapist;Carroll Enterkin RN, BSN;Renee Dillard Essex MS, ACSM CEP Exercise Physiologist   ER physicians immediately available to respond to emergencies [facility] immediately available ER MD   Physician(s) Kerman Passey and Thomasene Lot   Medication changes reported     No   Fall or balance concerns reported    No   Warm-up and Cool-down Performed on first and last piece of equipment   VAD Patient? No   Pain Assessment   Currently in Pain? No/denies   Multiple Pain Sites No         Goals Met:  Proper associated with RPD/PD & O2 Sat Independence with exercise equipment Exercise tolerated well Strength training completed today  Goals Unmet:  Not Applicable  Goals Comments: [name] is back on Advair 250/50 ; changed from Symbicort which was not helping her like the Advair helped her.   Dr. Emily Filbert is Medical Director for [place] and [facility].

## 2014-09-12 NOTE — Progress Notes (Signed)
Daily Session Note  Patient Details  Name: SYRIA KESTNER MRN: [id number] Date of Birth: June 20, 1955 Referring Provider:  Harlan Stains, MD  Encounter Date: 09/12/2014  Check In:     Session Check In - 09/12/14 1240    Check-In   Staff Present Laureen Owens Shark BS, RRT, Respiratory Therapist;Carroll Enterkin RN, BSN;Benjamim Harnish Dillard Essex MS, ACSM CEP Exercise Physiologist   ER physicians immediately available to respond to emergencies [facility] immediately available ER MD   Physician(s) Kerman Passey and Thomasene Lot   Medication changes reported     No   Fall or balance concerns reported    No   Warm-up and Cool-down Performed on first and last piece of equipment   VAD Patient? No   Pain Assessment   Currently in Pain? No/denies   Multiple Pain Sites No         Goals Met:  Proper associated with RPD/PD & O2 Sat Independence with exercise equipment Using PLB without cueing & demonstrates good technique Exercise tolerated well Personal goals reviewed Strength training completed today  Goals Unmet:  Not Applicable  Goals Comments:    Dr. Emily Filbert is Medical Director for [place] and [facility].

## 2014-09-15 ENCOUNTER — Encounter: Payer: 59 | Admitting: *Deleted

## 2014-09-15 DIAGNOSIS — J449 Chronic obstructive pulmonary disease, unspecified: Secondary | ICD-10-CM

## 2014-09-15 DIAGNOSIS — G4733 Obstructive sleep apnea (adult) (pediatric): Secondary | ICD-10-CM

## 2014-09-15 DIAGNOSIS — J432 Centrilobular emphysema: Secondary | ICD-10-CM | POA: Diagnosis not present

## 2014-09-15 NOTE — Progress Notes (Signed)
Daily Session Note  Patient Details  Name: Kelly Hale MRN: [id number] Date of Birth: [birth year] Referring Provider:  Harlan Stains, MD  Encounter Date: 09/15/2014  Check In:     Session Check In - 09/15/14 1156    Check-In   Staff Present Laureen Owens Shark BS, RRT, Respiratory Therapist;Renee Dillard Essex MS, ACSM CEP Exercise Physiologist;Anaka Beazer RN, BSN   ER physicians immediately available to respond to emergencies [facility] immediately available ER MD   Physician(s) Dr. Jacqualine Code and Dr. Corky Downs   Medication changes reported     No   Fall or balance concerns reported    No   Warm-up and Cool-down Performed on first and last piece of equipment   VAD Patient? No   Pain Assessment   Currently in Pain? No/denies         Goals Met:  Proper associated with RPD/PD & O2 Sat Exercise tolerated well  Goals Unmet:  Not Applicable  Goals Comments:    Dr. Emily Filbert is Medical Director for [place] and [facility].

## 2014-09-17 ENCOUNTER — Encounter: Payer: 59 | Admitting: *Deleted

## 2014-09-17 DIAGNOSIS — J449 Chronic obstructive pulmonary disease, unspecified: Secondary | ICD-10-CM

## 2014-09-17 DIAGNOSIS — G4733 Obstructive sleep apnea (adult) (pediatric): Secondary | ICD-10-CM

## 2014-09-17 NOTE — Progress Notes (Signed)
Daily Session Note  Patient Details  Name: Kelly Hale MRN: [id number] Date of Birth: Jan 01, 1956 Referring Provider:  Harlan Stains, MD  Encounter Date: 09/17/2014  Check In:     Session Check In - 09/17/14 1204    Check-In   Staff Present Lestine Box BS, ACSM EP-C, Exercise Physiologist;Poet Hineman Dillard Essex MS, ACSM CEP Exercise Physiologist;Laureen Janell Quiet, RRT, Respiratory Therapist   ER physicians immediately available to respond to emergencies [facility] immediately available ER MD   Physician(s) Claudette Head and Archie Balboa   Medication changes reported     No   Fall or balance concerns reported    No   Warm-up and Cool-down Performed on first and last piece of equipment   VAD Patient? No   Pain Assessment   Currently in Pain? No/denies   Multiple Pain Sites No         Goals Met:  Proper associated with RPD/PD & O2 Sat Independence with exercise equipment Using PLB without cueing & demonstrates good technique Exercise tolerated well Personal goals reviewed Strength training completed today  Goals Unmet:  Not Applicable  Goals Comments:   Dr. Emily Filbert is Medical Director for [place] and [facility].

## 2014-09-19 ENCOUNTER — Encounter: Payer: 59 | Admitting: *Deleted

## 2014-09-19 DIAGNOSIS — J449 Chronic obstructive pulmonary disease, unspecified: Secondary | ICD-10-CM

## 2014-09-19 DIAGNOSIS — J432 Centrilobular emphysema: Secondary | ICD-10-CM | POA: Diagnosis not present

## 2014-09-19 DIAGNOSIS — G4733 Obstructive sleep apnea (adult) (pediatric): Secondary | ICD-10-CM

## 2014-09-19 NOTE — Progress Notes (Signed)
Daily Session Note  Patient Details  Name: Kelly Hale MRN: [id number] Date of Birth: 12/27/1955 Referring Provider:  Harlan Stains, MD  Encounter Date: 09/19/2014  Check In:     Session Check In - 09/19/14 1236    Check-In   Staff Present Candiss Norse MS, ACSM CEP Exercise Physiologist;Stacey Blanch Media RRT, RCP Respiratory Therapist;Sailor Hevia RN, BSN   ER physicians immediately available to respond to emergencies [facility] immediately available ER MD   Physician(s) Dr. Jacqualine Code, and Dr. Jimmye Norman   Medication changes reported     No   Fall or balance concerns reported    No   Warm-up and Cool-down Performed on first and last piece of equipment   VAD Patient? No   Pain Assessment   Currently in Pain? No/denies   Multiple Pain Sites No         Goals Met: Exercise tolerated well.    Goals Unmet:  Not Applicable  Goals Comments:    Dr. Emily Filbert is Medical Director for [place] and [facility].

## 2014-09-24 ENCOUNTER — Encounter: Payer: 59 | Admitting: *Deleted

## 2014-09-24 DIAGNOSIS — G4733 Obstructive sleep apnea (adult) (pediatric): Secondary | ICD-10-CM

## 2014-09-24 DIAGNOSIS — J449 Chronic obstructive pulmonary disease, unspecified: Secondary | ICD-10-CM

## 2014-09-24 DIAGNOSIS — J432 Centrilobular emphysema: Secondary | ICD-10-CM | POA: Diagnosis not present

## 2014-09-24 NOTE — Progress Notes (Signed)
Daily Session Note  Patient Details  Name: Kelly Hale MRN: [id number] Date of Birth: January 23, 1956 Referring Provider:  Harlan Stains, MD  Encounter Date: 09/24/2014  Check In:     Session Check In - 09/24/14 1142    Check-In   Staff Present Laureen Owens Shark BS, RRT, Respiratory Therapist;Mackenze Grandison RN, BSN;Steven Way BS, ACSM EP-C, Exercise Physiologist   ER physicians immediately available to respond to emergencies [facility] immediately available ER MD   Physician(s) Dr. Edd Fabian and Dr. Reita Cliche   Medication changes reported     No   Fall or balance concerns reported    No   Warm-up and Cool-down Performed on first and last piece of equipment   VAD Patient? No   Pain Assessment   Currently in Pain? No/denies         Goals Met:  Proper associated with RPD/PD & O2 Sat Exercise tolerated well  Goals Unmet:  Not Applicable  Goals Comments:    Dr. Emily Filbert is Medical Director for [place] and [facility].

## 2014-09-26 ENCOUNTER — Encounter: Payer: 59 | Admitting: *Deleted

## 2014-09-26 DIAGNOSIS — G4733 Obstructive sleep apnea (adult) (pediatric): Secondary | ICD-10-CM

## 2014-09-26 DIAGNOSIS — J449 Chronic obstructive pulmonary disease, unspecified: Secondary | ICD-10-CM

## 2014-09-26 DIAGNOSIS — J432 Centrilobular emphysema: Secondary | ICD-10-CM | POA: Diagnosis not present

## 2014-09-26 NOTE — Progress Notes (Signed)
Daily Session Note  Patient Details  Name: Kelly Hale MRN: [id number] Date of Birth: 1955-11-26 Referring Provider:  Harlan Stains, MD  Encounter Date: 09/26/2014  Check In:     Session Check In - 09/26/14 1138    Check-In   Staff Present Gerlene Burdock RN, BSN;Stacey Blanch Media RRT, RCP Respiratory Therapist;Renee Dillard Essex MS, ACSM CEP Exercise Physiologist   ER physicians immediately available to respond to emergencies [facility] immediately available ER MD   Physician(s) Dr. Corky Downs, Dr. Edd Fabian   Medication changes reported     No   Fall or balance concerns reported    No   Warm-up and Cool-down Performed on first and last piece of equipment   VAD Patient? No   Pain Assessment   Currently in Pain? No/denies         Goals Met:  Proper associated with RPD/PD & O2 Sat Exercise tolerated well  Goals Unmet:  Not Applicable  Goals Comments: Still smoking 2-3 cigarettes per day.    Dr. Emily Filbert is Medical Director for [place] and [facility].

## 2014-09-29 ENCOUNTER — Encounter: Payer: Self-pay | Admitting: Internal Medicine

## 2014-09-29 ENCOUNTER — Ambulatory Visit: Payer: 59 | Admitting: Internal Medicine

## 2014-09-29 DIAGNOSIS — G4730 Sleep apnea, unspecified: Secondary | ICD-10-CM

## 2014-09-29 DIAGNOSIS — J432 Centrilobular emphysema: Secondary | ICD-10-CM

## 2014-09-29 NOTE — Progress Notes (Signed)
Pulmonary Individual Treatment Plan  Patient Details  Name: Kelly Hale MRN: [id number] Date of Birth: 1955-07-02 Referring Provider:  Dr Vilinda Boehringer  Initial Encounter Date:  06/24/2014  Visit Diagnosis: Centrilobular emphysema  Sleep apnea  Patient's Home Medications on Admission:  Current outpatient prescriptions:    albuterol (PROVENTIL) (2.5 MG/3ML) 0.083% nebulizer solution, Take 3 mLs (2.5 mg total) by nebulization every 6 (six) hours as needed for wheezing or shortness of breath., Disp: 100 vial, Rfl: 11   amLODipine (NORVASC) 5 MG tablet, Take 1 tablet by mouth daily., Disp: , Rfl:    budesonide-formoterol (SYMBICORT) 160-4.5 MCG/ACT inhaler, Inhale 2 puffs into the lungs 2 (two) times daily., Disp: 3 Inhaler, Rfl: 0   Fluticasone-Salmeterol (ADVAIR DISKUS) 250-50 MCG/DOSE AEPB, Inhale 1 puff into the lungs 2 (two) times daily., Disp: 3 each, Rfl: 0   Fluticasone-Salmeterol (ADVAIR) 500-50 MCG/DOSE AEPB, Inhale 1 puff into the lungs every 12 (twelve) hours., Disp: , Rfl:    furosemide (LASIX) 20 MG tablet, Take 1 tablet (20 mg total) by mouth daily. (Patient taking differently: Take 20 mg by mouth every other day. ), Disp: 90 tablet, Rfl: 3   HYDROcodone-homatropine (HYCODAN) 5-1.5 MG/5ML syrup, 5 ml every 6 hours if needed for cough, Disp: 200 mL, Rfl: 0   meloxicam (MOBIC) 15 MG tablet, TAKE 1 TABLET (15 MG TOTAL) BY MOUTH ONCE DAILY., Disp: , Rfl: 0   Multiple Vitamin (MULTIVITAMIN) capsule, Take 1 capsule by mouth daily., Disp: , Rfl:    nicotine (NICODERM CQ - DOSED IN MG/24 HOURS) 21 mg/24hr patch, Place 1 patch (21 mg total) onto the skin daily., Disp: 28 patch, Rfl: 1   OXYGEN, Inhale 2 L into the lungs daily. 2 liters daily, Disp: , Rfl:    PROAIR HFA 108 (90 BASE) MCG/ACT inhaler, INHALE 2 PUFFS BY MOUTH 4 TIMES A DAY AS NEEDED, Disp: , Rfl: 0   Vitamin D, Ergocalciferol, (DRISDOL) 50000 UNITS CAPS capsule, Take 1 capsule by mouth once a week., Disp: ,  Rfl:   Past Medical History: Past Medical History  Diagnosis Date   Asthma    Hyperlipidemia    Aspirin allergy    Morbid obesity    Tobacco abuse    CHF (congestive heart failure)     Tobacco Use: History  Smoking status   Current Every Day Smoker -- 1.00 packs/day for 30 years   Types: Cigarettes  Smokeless tobacco   Never Used    Comment: quit in Sept 2015, but started back smoking up to 2 cigs per day    Labs: Recent Review Flowsheet Data    There is no flowsheet data to display.       ADL [facility]:     ADL [facility]      06/24/14 1130 06/24/14 1458     ADL [facility]   ADL Phase Entry Mid    SOB Score total 45 64    Rest 0 0    Walk 1 1    Stairs 3 5    Bath 3 3    Dress 3 3    Shop 2 2        Pulmonary Function Assessment:   Exercise Target Goals:    Exercise Program Goal: Individual exercise prescription set with THRR, safety & activity barriers. Participant demonstrates ability to understand and report RPE using BORG scale, to self-measure pulse accurately, and to acknowledge the importance of the exercise prescription.  Exercise Prescription Goal: Starting with aerobic activity 30  plus minutes a day, 3 days per week for initial exercise prescription. Provide home exercise prescription and guidelines that participant acknowledges understanding prior to discharge.  Activity Barriers & Risk Stratification:   6 Minute Walk:     6 Minute Walk      06/24/14 1215 08/22/14 1300     6 Minute Walk   Phase Initial Mid Program    Distance 1160 feet 1015 feet    Walk Time 6 minutes 6 minutes    Resting HR 98 bpm 105 bpm    Resting BP 138/80 mmHg 152/88 mmHg    Max Ex. HR 129 bpm 116 bpm    Max Ex. BP 154/88 mmHg 156/88 mmHg    RPE 12 11    Perceived Dyspnea  3 3.5    Symptoms  No       Initial Exercise Prescription:   Exercise Prescription Changes:     Exercise Prescription Changes      08/18/14 1200 09/01/14 1000 09/01/14 1300 09/05/14  1200 09/15/14 1200   Exercise Review   Progression  Yes Yes  Yes   Response to Exercise   Blood Pressure (Admit)  100/80 mmHg 100/80 mmHg     Blood Pressure (Exercise)  142/80 mmHg 142/80 mmHg     Blood Pressure (Exit)  134/80 mmHg 134/80 mmHg     Heart Rate (Admit)   109 bpm     Heart Rate (Exercise)   120 bpm     Heart Rate (Exit)   106 bpm     Oxygen Saturation (Admit)   93 %     Oxygen Saturation (Exercise)   90 %     Oxygen Saturation (Exit)   94 %     Rating of Perceived Exertion (Exercise)   12     Perceived Dyspnea (Exercise)   3     Intensity  THRR unchanged THRR unchanged     Progression  Continue progressive overload as per policy without signs/symptoms or physical distress. Continue progressive overload as per policy without signs/symptoms or physical distress.     Resistance Training   Training Prescription  Yes Yes     Weight  3 3     Reps  10-12 10-12     Treadmill   MPH  2.2 2.2  2.5   Grade  _0 Minutes  15 15     NuStep   Level  5 5     Watts  55 55     Minutes  15 15     Recumbant Elliptical   Level _1 4.5   RPM 45 45 45 50 50   Minutes  _2 09/17/14 1200 09/24/14 1200 09/24/14 1500       Exercise Review   Progression  Yes Yes     Response to Exercise   Blood Pressure (Admit)   144/78 mmHg     Blood Pressure (Exercise)   170/88 mmHg     Blood Pressure (Exit)   114/68 mmHg     Heart Rate (Admit)   117 bpm     Heart Rate (Exercise)   122 bpm     Heart Rate (Exit)   104 bpm     Oxygen Saturation (Admit)   95 %     Oxygen Saturation (Exercise)   93 %  3l/m continous     Oxygen Saturation (Exit)   93 %  Rating of Perceived Exertion (Exercise)   15     Perceived Dyspnea (Exercise)   3     Treadmill   MPH 2.5 2.5      Grade 1 1      Minutes 15 15      NuStep   Level 6 6      Watts 65 65      Minutes 15 15      Recumbant Elliptical   Level 4.5 5      RPM 50 50      Minutes 15          Discharge Exercise Prescription  (Final Exercise Prescription Changes):     Exercise Prescription Changes - 09/24/14 1500    Exercise Review   Progression Yes   Response to Exercise   Blood Pressure (Admit) 144/78 mmHg   Blood Pressure (Exercise) 170/88 mmHg   Blood Pressure (Exit) 114/68 mmHg   Heart Rate (Admit) 117 bpm   Heart Rate (Exercise) 122 bpm   Heart Rate (Exit) 104 bpm   Oxygen Saturation (Admit) 95 %   Oxygen Saturation (Exercise) 93 %  3l/m continous   Oxygen Saturation (Exit) 93 %   Rating of Perceived Exertion (Exercise) 15   Perceived Dyspnea (Exercise) 3       Nutrition:  Target Goals: Understanding of nutrition guidelines, daily intake of sodium <1557m, cholesterol <2060m calories 30% from fat and 7% or less from saturated fats, daily to have 5 or more servings of fruits and vegetables.  Biometrics:    Nutrition Therapy Plan and Nutrition Goals:   Nutrition Discharge: Rate Your Plate Scores:   Psychosocial: Target Goals: Acknowledge presence or absence of depression, maximize coping skills, provide positive support system. Participant is able to verbalize types and ability to use techniques and skills needed for reducing stress and depression.  Initial Review & Psychosocial Screening:   Quality of Life Scores:   PHQ-9:     Recent Review Flowsheet Data    There is no flowsheet data to display.      Psychosocial Evaluation and Intervention:     Psychosocial Evaluation - 08/18/14 1243    Psychosocial Evaluation & Interventions   Interventions Stress management education;Relaxation education;Encouraged to exercise with the program and follow exercise prescription   Comments Counselor met with Kelly Hale for psychosocial evaluation.  She is a [age]ear old female who is struggling with COPD and congestive Heart Failure.  She has a strong support system with a spouse of 3835ears and adult children and a mother who lives close by.  Kelly Hale is actively involved in her  church community.  She reports that she sleeps fairly well with 3/7 nights problems subsequent to issues with her CPAP machine mask.  She also denies a history of depression or depressive symptoms and states she is typically in a positive mood.  Her primary stressors are finances currently with all the building medical bills.  Kelly. Kelly Hale for this program are to lose weight, breathe better and consistently exercise.  Counselor Hale follow with her as needed.     Continued Psychosocial Services Needed Yes  Kelly Hale benefit from the psychoeducational components of this program, particularly stress management.  Also, meeting with the dietician to accomplish her weight loss goals is recommended.      Psychosocial Re-Evaluation:  Education: Education Goals: Education classes Hale be provided on a weekly basis, covering required topics. Participant Hale state understanding/return demonstration of  topics presented.  Learning Barriers/Preferences:   Education Topics: Initial Evaluation Education: - Verbal, written and demonstration of respiratory meds, RPE/PD scales, oximetry and breathing techniques. Instruction on use of nebulizers and MDIs: cleaning and proper use, rinsing mouth with steroid doses and importance of monitoring MDI activations.   General Nutrition Guidelines/Fats and Fiber: -Group instruction provided by verbal, written material, models and posters to present the general guidelines for heart healthy nutrition. Gives an explanation and review of dietary fats and fiber.   Controlling Sodium/Reading Food Labels: -Group verbal and written material supporting the discussion of sodium use in heart healthy nutrition. Review and explanation with models, verbal and written materials for utilization of the food label.   Exercise Physiology & Risk Factors: - Group verbal and written instruction with models to review the exercise physiology of the cardiovascular system and associated  critical values. Details cardiovascular disease risk factors and the goals associated with each risk factor.   Aerobic Exercise & Resistance Training: - Gives group verbal and written discussion on the health impact of inactivity. On the components of aerobic and resistive training programs and the benefits of this training and how to safely progress through these programs.   Flexibility, Balance, General Exercise Guidelines: - Provides group verbal and written instruction on the benefits of flexibility and balance training programs. Provides general exercise guidelines with specific guidelines to those with heart or lung disease. Demonstration and skill practice provided.   Stress Management: - Provides group verbal and written instruction about the health risks of elevated stress, cause of high stress, and healthy ways to reduce stress.   Depression: - Provides group verbal and written instruction on the correlation between heart/lung disease and depressed mood, treatment options, and the stigmas associated with seeking treatment.   Exercise & Equipment Safety: - Individual verbal instruction and demonstration of equipment use and safety with use of the equipment.   Infection Prevention: - Provides verbal and written material to individual with discussion of infection control including proper hand washing and proper equipment cleaning during exercise session.   Falls Prevention: - Provides verbal and written material to individual with discussion of falls prevention and safety.   Diabetes: - Individual verbal and written instruction to review signs/symptoms of diabetes, desired ranges of glucose level fasting, after meals and with exercise. Advice that pre and post exercise glucose checks Hale be done for 3 sessions at entry of program.   Chronic Lung Diseases: - Group verbal and written instruction to review new updates, new respiratory medications, new advancements in  procedures and treatments. Provide informative websites and "800" numbers of self-education.   Lung Procedures: - Group verbal and written instruction to describe testing methods done to diagnose lung disease. Review the outcome of test results. Describe the treatment choices: Pulmonary Function Tests, ABGs and oximetry.   Energy Conservation: - Provide group verbal and written instruction for methods to conserve energy, plan and organize activities. Instruct on pacing techniques, use of adaptive equipment and posture/positioning to relieve shortness of breath.   Triggers: - Group verbal and written instruction to review types of environmental controls: home humidity, furnaces, filters, dust mite/pet prevention, HEPA vacuums. To discuss weather changes, air quality and the benefits of nasal washing.   Exacerbations: - Group verbal and written instruction to provide: warning signs, infection symptoms, calling MD promptly, preventive modes, and value of vaccinations. Review: effective airway clearance, coughing and/or vibration techniques. Create an Sports administrator.   Oxygen: - Individual and group verbal and written  instruction on oxygen therapy. Includes supplement oxygen, available portable oxygen systems, continuous and intermittent flow rates, oxygen safety, concentrators, and Medicare reimbursement for oxygen.   Respiratory Medications: - Group verbal and written instruction to review medications for lung disease. Drug class, frequency, complications, importance of spacers, rinsing mouth after steroid MDI's, and proper cleaning methods for nebulizers.   AED/CPR: - Group verbal and written instruction with the use of models to demonstrate the basic use of the AED with the basic ABC's of resuscitation.   Breathing Retraining: - Provides individuals verbal and written instruction on purpose, frequency, and proper technique of diaphragmatic breathing and pursed-lipped breathing. Applies  individual practice skills.   Anatomy and Physiology of the Lungs: - Group verbal and written instruction with the use of models to provide basic lung anatomy and physiology related to function, structure and complications of lung disease.   Heart Failure: - Group verbal and written instruction on the basics of heart failure: signs/symptoms, treatments, explanation of ejection fraction, enlarged heart and cardiomyopathy.   Sleep Apnea: - Individual verbal and written instruction to review Obstructive Sleep Apnea. Review of risk factors, methods for diagnosing and types of masks and machines for OSA.   Anxiety: - Provides group, verbal and written instruction on the correlation between heart/lung disease and anxiety, treatment options, and management of anxiety.   Relaxation: - Provides group, verbal and written instruction about the benefits of relaxation for patients with heart/lung disease. Also provides patients with examples of relaxation techniques.   Knowledge Questionnaire Score:   Personal Goals and Risk Factors at Admission:     Personal Goals and Risk Factors at Admission - 06/24/14 1130    Personal Goals and Risk Factors on Admission    Weight Management Yes   Intervention Learn and follow the exercise and diet guidelines while in the program. Utilize the nutrition and education classes to help gain knowledge of the diet and exercise expectations in the program   Admit Weight 275 lb (124.739 kg)   Goal Weight 160 lb (72.576 kg)   Increase Aerobic Exercise and Physical Activity Yes   Intervention While in program, learn and follow the exercise prescription taught. Start at a low level workload and increase workload after able to maintain previous level for 30 minutes. Increase time before increasing intensity.   Quit Smoking Yes   Intervention Utilize your health care professional team to help with smoking cessation while in the program. Your doctor can prescribe  medications to aid in cessation. The program can provide information and counseling as needed.   Understand more about Heart/Pulmonary Disease. Yes   Intervention While in program utilize professionals for any questions, and attend the education sessions. Great websites to use are www.americanheart.org or www.lung.org for reliable information.   Improve shortness of breath with ADL's Yes   Intervention While in program, learn and follow the exercise prescription taught. Start at a low level workload and increase workload ad advised by the exercise physiologist. Increase time before increasing intensity.   Develop more efficient breathing techniques such as purse lipped breathing and diaphragmatic breathing; and practicing self-pacing with activity Yes   Intervention While in program, learn and utilize the specific breathing techniques taught to you. Continue to practice and use the techniques as needed.   Increase knowledge of respiratory medications and ability to use respiratory devices properly.  Yes   Intervention While in program learn and demonstrate appropriate use of your oxygen therapy by increasing flow with exertion, manage oxygen tank  operation, including continuous and intermittent flow.  Understanding oxygen is a drug ordered by your physician.;While in program, learn to administer MDI, nebulizer, and spacer properly.;Learn to take respiratory medicine as ordered.;While in program, learn to Clean MDI, nebulizers, and spacers properly.      Personal Goals and Risk Factors Review:      Goals and Risk Factor Review      08/27/14 1130 09/12/14 1620 09/15/14 1213 09/15/14 1440 09/17/14 1130   Increase Aerobic Exercise and Physical Activity   Goals Progress/Improvement seen    Yes     Comments   Discussed [employer] and the [employer]. Kelly Hale may join it or may purchase a treadmill for use at home. Given the scholarship info for [employer].      Quit Smoking   Goals  Progress/Improvement seen  No No No No   Comments 2-3cig/day; she smokes randomly during the day to help with cessation of smoking. still smoking 2-3cig/day Still smoking 2-3 cigarettes/day Continues smoking 2-3cig/day Kelly Hale is still smoking 2-3 cig/d and has no quit date set.     09/19/14 1237 09/24/14 1257 09/24/14 1459 09/26/14 1455     Weight Management   Goals Progress/Improvement seen  No      Comments  Kelly Hale is frustrated with little progress on losing weight. She has only lost a few pounds in the last month and a half and is discouraged. We discussed caloric balance and how weight loss works and the caloric deficit exercise creates, but that nutrition is more powerful in creating a caloric deficit. We spent a lot of time discusing how many calories are expened during her exercise and high calorie foods to avoid.       Increase Aerobic Exercise and Physical Activity   Goals Progress/Improvement seen  Yes Yes      Comments  Kelly Hale is consistently increasing her workloads on all the equipment and works very hard during exercise.       Quit Smoking   Goals Progress/Improvement seen No  No No    Comments Still is smoking 2-3 cigarettes/day  Smoking 2-3cig/day; gave her the smoking cessation literature from [place] still smoking 2-3 cig/day    Understand more about Heart/Pulmonary Disease   Goals Progress/Improvement seen   Yes      Comments  Kelly Hale has enjoyed the educational lectures and feels more comfortable managing her disease.       Improve shortness of breath with ADL's   Goals Progress/Improvement seen   Yes      Breathing Techniques   Goals Progress/Improvement seen     Yes    Comments    Kelly Hale demonstrates proper technique for PLB and uses it when apporate,    Increase knowledge of respiratory medications   Goals Progress/Improvement seen     Yes    Comments    Went over proper technique for use of MDI and spacer and to rinse mouth out after taking Advair.     Other Goal   Goals Progress/Improvement seen     Yes    Comments    Oxygen.  Kelly Hale seems to be very confident in setting up and adjusting her oxygen with use for exercise.  She states that she has no question about O2.       Personal Goals Discharge:      Personal Goals at Discharge - 09/01/14 1251    Quit Smoking   Comments Kelly Hale is still smoking 2-3 cigarettes per  day and is not currently interested in setting a Quit Date. She was educated on using sugar free candies or cinnamon gum today in order to help manage oral cravings and was very willing to try this. She is still using patches for NRT.  She also really wants to get back to painting but usually does so while she smokes. Educated on  how changing current routines can aid in quitting and she can use painting as a positive reward for being smoke free.  Kelly Hale Hale reconsider quit date after next pulmonologist appt.       Comments: 30 day review

## 2014-09-30 ENCOUNTER — Encounter: Payer: Self-pay | Admitting: Internal Medicine

## 2014-10-01 ENCOUNTER — Encounter: Payer: Self-pay | Admitting: Internal Medicine

## 2014-10-01 ENCOUNTER — Ambulatory Visit (INDEPENDENT_AMBULATORY_CARE_PROVIDER_SITE_OTHER): Payer: 59 | Admitting: Internal Medicine

## 2014-10-01 VITALS — BP 108/66 | HR 90 | Temp 98.5°F | Ht 67.0 in | Wt 278.8 lb

## 2014-10-01 DIAGNOSIS — G4733 Obstructive sleep apnea (adult) (pediatric): Secondary | ICD-10-CM | POA: Diagnosis not present

## 2014-10-01 DIAGNOSIS — Z720 Tobacco use: Secondary | ICD-10-CM

## 2014-10-01 DIAGNOSIS — J432 Centrilobular emphysema: Secondary | ICD-10-CM | POA: Diagnosis not present

## 2014-10-01 DIAGNOSIS — Z9989 Dependence on other enabling machines and devices: Secondary | ICD-10-CM

## 2014-10-01 DIAGNOSIS — F17200 Nicotine dependence, unspecified, uncomplicated: Secondary | ICD-10-CM

## 2014-10-01 MED ORDER — FLUTICASONE-SALMETEROL 500-50 MCG/DOSE IN AEPB: 1.0000 | INHALATION_SPRAY | Freq: Two times a day (BID) | RESPIRATORY_TRACT | Status: DC

## 2014-10-01 NOTE — Assessment & Plan Note (Signed)
OSA - Very Severe, AHI=63.8, 16cmH2O, 2L O2 bleed in at night\with sleep. Discussed sleep data and reviewed with patient.  Encouraged proper weight management.  Excessive weight may contribute to snoring.  Monitor sedative use.  Discussed driving precautions and its relationship with hypersomnolence.  Discussed operating dangerous equipment and its relationship with hypersomnolence.  Discussed sleep hygiene, and benefits of a fixed sleep waked time.  The importance of getting eight or more hours of sleep discussed with patient.  Discussed limiting the use of the computer and television before bedtime.  Decrease naps during the day, so night time sleep will become enhanced.  Limit caffeine, and sleep deprivation.  HTN, stroke, and heart failure are potential risk factors.     Plan: Continue with CPAP 16cm H2O with 2L [place] O2 at night. Patient with 100% compliance, average use 8.4hrs, average AHI 2.6 - data for 90 days of use

## 2014-10-01 NOTE — Patient Instructions (Addendum)
Follow up with Dr. Stevenson Clinch in 3 months - Advair 500/50 - 1 puff twice a day - gargle and rinse - 90 day supply - cont with CPAP nightly - you are currently doing a great job - diet, exercise, wt loss - cont with pulmonary rehab.  - tobacco cessation - if at follow up you are still smoking then we will discuss other options.

## 2014-10-01 NOTE — Progress Notes (Signed)
Daily Session Note  Patient Details  Name: Kelly Hale MRN: [id number] Date of Birth: Jan 28, 1956 Referring Provider:  Harlan Stains, MD  Encounter Date: 10/01/2014  Check In:     Session Check In - 10/01/14 1153    Check-In   Staff Present Candiss Norse MS, ACSM CEP Exercise Physiologist;Laureen Janell Quiet, RRT, Respiratory Therapist;Cameka Rae BS, ACSM EP-C, Exercise Physiologist   ER physicians immediately available to respond to emergencies [facility] immediately available ER MD   Physician(s) Thomasene Lot and lord   Medication changes reported     No   Fall or balance concerns reported    No   Warm-up and Cool-down Performed on first and last piece of equipment   VAD Patient? No   Pain Assessment   Currently in Pain? No/denies         Goals Met:  Proper associated with RPD/PD & O2 Sat Exercise tolerated well No report of cardiac concerns or symptoms Strength training completed today  Goals Unmet:  Not Applicable  Goals Comments:   Dr. Emily Filbert is Medical Director for [place] and [facility].

## 2014-10-01 NOTE — Assessment & Plan Note (Signed)
COPD    Plan-  - O2 2L with minimal exertion and with sleep - Advair 500/50 -severe OSA (AHI=63.8) - cpap 16cm H2O - retired from current job.  - until he would pulmonary rehabilitation - overlap syndrome (COPD + OSA) - discussed in detailed with patient - tobacco cessation - still smoking, down to 2 cigs per day,

## 2014-10-01 NOTE — Assessment & Plan Note (Signed)
Tobacco Cessation - Counseling regarding benefits of smoking cessation strategies was provided for more than 12 min. - Educated that at this time smoking- cessation represents the single most important step that patient can take to enhance the length and quality of live. - Educated patient regarding alternatives of behavior interventions, pharmacotherapy including NRT and non-nicotine therapy such, and combinations of both. - Patient at this time: patient is using nicotine patches, down to 2 cigs per day, 15 patches are not really helping, if she is unable to quit on her on by her next visit we discussed starting Chantix

## 2014-10-01 NOTE — Progress Notes (Signed)
MRN# [id number] Kelly Hale 08/19/1955   CC: Chief Complaint  Patient presents with  . Follow-up    CPAP f/u DME -[facility] Pt wears CPAP      Brief History: 12//3/12- [name] smoker followed for chronic bronchitis, tobacco use LOV- 05/06/10 Did well through the summer. Dr. Larena Glassman increased Advair to 500/50 without benefit. As cooler weather came in, she noticed increased wheeze, cough productive of white or trace yellow sputum. 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 She rates okay at night except that wheeze delays her sleep onset. Still smoking one pack per day and resists admitting that it is causing any problem. She says her husband is similar but doesn't smoke. She chokes easily with liquids. CXR- 05/06/10- bronchitis  06/17/11- [name] smoker followed for chronic bronchitis, tobacco use CXR 05/06/10 reviewed w/ her-  1. No active lung disease.  2. Peribronchial thickening consistent with bronchitis.  3. Thoracic scoliosis.  Provider: Lorina Rabon   She felt okay through this winter with no significant respiratory infection. Dislikes cold weather. Does not expect a pollen problems the spring. Continues Advair 500 and uses her rescue inhaler once every 3 days. Little cough or drainage usually. Pain in right arm and right side of neck hurts enough to keep her awake. 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 We had given samples Spiriva with no effect. Continues to smoke against advice. PFT: 03/22/2011-FEV1 1.49/58%, FEV1/FVC 0.62, insignificant response to bronchodilator, RV 163% indicating air trapping, DLCO 72%. Moderate obstructive airways disease without response to bronchodilator. Air-trapping. Assessment:  COPD with bronchitis - [name], MD at 06/19/2011 8:45 PM   Status: Written Related Problem: BRONCHITIS   Chronic bronchitis pattern in context of moderately severe COPD with poor response to  bronchodilator on PFTs. We discussed this as I reviewed the PFTs. Weight loss and smoking cessation of the most important things she could do. Plan-try Brovana nebulizer treatment here     Neuropathic pain, arm - [name], MD at 06/19/2011 8:53 PM   Status: Written Related Problem: Neuropathic pain, arm   She describes pain in the right side of neck and into the right arm with numbness in the thumb and first 2 fingers of the right hand. 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 Chest x-ray does not show a Pancoast tumor. She is to speak with her primary physician about it, anticipating probable referral.  TOBACCO USER - [name], MD at 06/19/2011 8:53 PM   Status: Written Related Problem: TOBACCO USER   I tried to engage her in discussion of smoking cessation approaches. She is not motivated.   ----------------------------------------------------------------------- 08/22/13- 57 yoF smoker (1ppd/ 30 pk yr) with COPD/bronchitis/asthma complicated by tobacco use [name] Cough was worse for 3 weeks but now back at baseline. Denies routine daily cough, but still some green sputum. Took Levaquin for 10 days, prednisone for 10 days ending 4 days ago. Baseline dyspnea on exertion without blood, swelling, chest pain, palpitation. Using [place]. Advair 500, nebulizer albuterol, rescue inhaler. 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 CXR 08/12/13 IMPRESSION:  No acute infiltrate or pulmonary edema. Thoracic dextroscoliosis  again noted. Central mild bronchitic changes.  Electronically Signed  By: [name] M.D.  On: 08/12/2013 13:22   12/19/13- 54 yoF smoker (1ppd/ 30 pk yr) with COPD/bronchitis/asthma complicated by tobacco  use [name] FOLLOWS FOR:sob,wheeze x 1 wk.,bilat. ankle and leg edema,cough-clear,no fcs,midchest tightness and  pain She feels like asthma, coughing clear mucus, persistent midsternal soreness- not radiating or pleuritic, feet swelling a lot.  On arrival here sat mid-70% on room air walking. 90% on O2 2l at rest  12/27/13- 57 yoF former smoker (1ppd/ 30 pk yr) with COPD/bronchitis/asthma complicated by tobacco use [name] Husband here FOLLOWS FOR: Pt here after hospital d/c from [facility](summary pending). Pt c/o DOE, prod cough with little yellow mucus. Pt denies CP/tightness. Dx'd diastolic CHF. Discharged on O2 2L. Pt was 83% RA O2 sat upon arrival to exam room , pt took several deep breaths and increased O2 to 91% and sustained at 90-91% while at rest. 83% walking on room air. Quit smoking in September.  CXR 12/19/13 IMPRESSION:  Cardiomegaly and emphysema without acute disease.  Electronically Signed  By: [name] M.D.  On: 12/19/2013 10:09  02/10/14- 7 yoF former smoker (1ppd/ 30 pk yr) with COPD/bronchitis/asthma complicated by tobacco use, CHF [name] Husband here FOLLOWS FOR: continues to wear O2 2Lat night through [facility]; states breathing has been doing well since last visit. Would like to have a refill of cough syrup as well. Rare need for Proair. Concerned about ability to return to work unloading truck after hosp for [facility]. Sees cardiology in 2 days. Notes DOE walking house to truck.  Acute problem- pain right ear and behind angle of jaw. Not popping.   ROV 03/18/14 3 yoF former smoker (1ppd/ 30 pk yr) with COPD/bronchitis/asthma complicated by tobacco use, CHF [name] Husband here FOLLOWS FOR: chronic respiratory failure, new O2 dependent, also secondary to dCHF. Continues to wear O2 2Lat night through [facility]; states breathing has been doing well since last visit, but has not been wearing O2 much during the day. Upon arrival to the office her O2 sat was noted to be 84% on room air,  requiring 2L to get >88%, then upon resting 93% on RA. Would like to have a refill of cough syrup as well. Rare need for Proair. 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 Notes DOE walking house to truck.  Saw cardiology recently (Dr. [name]) who is optimizing her dCHF with BP control and diuresis (Lasix 20mg  daily, may inc to 40mg  for 3lb wt gain or inc leg swelling).  Patient stated that she is on temporary short term disability (works at [name] in the [employer], lifts boxes up to 30lbs during her 8 hr shift), and is supposed to return to work on 03/24/14 when her shot term disability ends, however she is concerned about her ability to perform her current duties. She will require 2L O2 continuously with minimal exertion, and one 15 lb O2 tank will last 3 hours, requiring her to take 2 tanks to work and move around with it, which will be difficult given that she is mobile at work lifting and moving boxes constantly during her shift.  Plan - COPD - on 2L with exertion, advair\prn albuterol, pulm rehab, check split night study  ROV 05/06/2014 Presents today for a follow up visit of her COPD. Since her last visit she has retired from [employer], and will start a retirement plan shortly. 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  Currently, she has not  been wearing her O2 with exertion, only at night, she thought at is what she was supposed to do. Currently she is still smoking 2-4 cigs per day, only wearing nicotine patch 5/7 days Since last visit no ED, urgent or hospitalization for COPD\dyspnea.  Following with cardiology for [facility].  Today upon checkin noted to desat from car to office, saturation was 87%, promptly return to >90% at rest. She has not been to pulm rehab yet, awaiting referral or appointments.  Today would like to discuss  the results of her split night study.  Plan-initiate CPAP, stop using tobacco, pulmonary rehabilitation referral, weight loss   ROV 06/2014: Patient presents today for followup visit of her COPD and sleep apnea. She was recently started on CPAP pressure of 16 cm of water, and plaster today show that she's been using it for the last 17 days, 100% compliance, AHI 2.6, average use 8.4 hours per night. Patient states that overall she has increased energy, she feels more awake, does not have as much headaches. Down to smoking 2 cigs per day, still with some stressors at home (sick Uncle). Will start Pulm rehab on 06/25/14. Plan - tobacco cessation, cpap, pulm rehab, O2 at night and with exertion, start symbicort   Events since last clinic visit: Patient presented today for a follow-up visit of her COPD, and OSA. At her last visit she was started on Symbicort, but her insurance would not pay for it She was given Advair 250/50 and advised to take that. Patient thought that she was supposed to be on Advair 500, and has been using Advair 250/50 twice per dose at times. Overall patient states that she is doing well, she is enjoying pulmonary rehabilitation, she walks about 2.5 miles an hour on the incline machine. She is wearing oxygen 2 L with exertion and at nighttime. She still is smoking about 2-3 cigarettes per day.     Medication:   Current Outpatient Rx  Name  Route  Sig  Dispense  Refill  . albuterol (PROVENTIL) (2.5 MG/3ML) 0.083% nebulizer solution   Nebulization   Take 3 mLs (2.5 mg total) by nebulization every 6 (six) hours as needed for wheezing or shortness of breath.   100 vial   11   . amLODipine (NORVASC) 5 MG tablet   Oral   Take 1 tablet by mouth daily.         . budesonide-formoterol (SYMBICORT) 160-4.5 MCG/ACT inhaler   Inhalation   Inhale 2 puffs into the lungs 2 (two) times daily.   3 Inhaler   0   . Fluticasone-Salmeterol (ADVAIR DISKUS) 250-50 MCG/DOSE  AEPB   Inhalation   Inhale 1 puff into the lungs 2 (two) times daily.   3 each   0   . Fluticasone-Salmeterol (ADVAIR) 500-50 MCG/DOSE AEPB   Inhalation   Inhale 1 puff into the lungs every 12 (twelve) hours.         . furosemide (LASIX) 20 MG tablet   Oral   Take 1 tablet (20 mg total) by mouth daily. Patient taking differently: Take 20 mg by mouth every other day.    90 tablet   3   . HYDROcodone-homatropine (HYCODAN) 5-1.5 MG/5ML syrup      5 ml every 6 hours if needed for cough   200 mL   0   . meloxicam (MOBIC) 15 MG tablet      TAKE 1 TABLET (15 MG TOTAL) BY MOUTH ONCE DAILY.  0   . Multiple Vitamin (MULTIVITAMIN) capsule   Oral   Take 1 capsule by mouth daily.         . nicotine (NICODERM CQ - DOSED IN MG/24 HOURS) 21 mg/24hr patch   Transdermal   Place 1 patch (21 mg total) onto the skin daily.   28 patch   1   . OXYGEN   Inhalation   Inhale 2 L into the lungs daily. 2 liters daily         . PROAIR HFA 108 (90 BASE) MCG/ACT inhaler      INHALE 2 PUFFS BY MOUTH 4 TIMES A DAY AS NEEDED      0     Dispense as written.   . Vitamin D, Ergocalciferol, (DRISDOL) 50000 UNITS CAPS capsule   Oral   Take 1 capsule by mouth once a week.            Review of Systems: Gen:  Denies  fever, sweats, chills HEENT: Denies blurred vision, double vision, ear pain, eye pain, hearing loss, nose bleeds, sore throat Cvc:  No dizziness, chest pain or heaviness Resp:   Admits TK:PTWS shortness of breath Gi: Denies swallowing difficulty, stomach pain, nausea or vomiting, diarrhea, constipation, bowel incontinence Gu:  Denies bladder incontinence, burning urine Ext:   No Joint pain, stiffness or swelling Skin: No skin rash, easy bruising or bleeding or hives Endoc:  No polyuria, polydipsia , polyphagia or weight change Other:  All other systems negative  Allergies:  Aspirin  Physical Examination:  VS: There were no vitals taken for this visit.  General  Appearance: No distress  HEENT: PERRLA, no ptosis, no other lesions noticed Pulmonary:normal breath sounds., diaphragmatic excursion normal.No wheezing, No rales   Cardiovascular:  Normal S1,S2.  No m/r/g.     Abdomen:Exam: Benign, Soft, non-tender, No masses  Skin:   warm, no rashes, no ecchymosis  Extremities: normal, no cyanosis, clubbing, warm with normal capillary refill.         Assessment and Plan:59 year old female past medical history of COPD, severe OSA, overlap syndrome, seen in follow-up for COPD and OSA. COPD with emphysema COPD    Plan-  - O2 2L with minimal exertion and with sleep - Advair 500/50 -severe OSA (AHI=63.8) - cpap 16cm H2O - retired from current job.  - until he would pulmonary rehabilitation - overlap syndrome (COPD + OSA) - discussed in detailed with patient - tobacco cessation - still smoking, down to 2 cigs per day,            TOBACCO USER Tobacco Cessation - Counseling regarding benefits of smoking cessation strategies was provided for more than 12 min. - Educated that at this time smoking- cessation represents the single most important step that patient can take to enhance the length and quality of live. - Educated patient regarding alternatives of behavior interventions, pharmacotherapy including NRT and non-nicotine therapy such, and combinations of both. - Patient at this time: patient is using nicotine patches, down to 2 cigs per day, 15 patches are not really helping, if she is unable to quit on her on by her next visit we discussed starting Chantix    OSA on CPAP OSA - Very Severe, AHI=63.8, 16cmH2O, 2L O2 bleed in at night\with sleep. Discussed sleep data and reviewed with patient.  Encouraged proper weight management.  Excessive weight may contribute to snoring.  Monitor sedative use.  Discussed driving precautions and its relationship with hypersomnolence.  Discussed operating dangerous equipment and  its relationship with  hypersomnolence.  Discussed sleep hygiene, and benefits of a fixed sleep waked time.  The importance of getting eight or more hours of sleep discussed with patient.  Discussed limiting the use of the computer and television before bedtime.  Decrease naps during the day, so night time sleep will become enhanced.  Limit caffeine, and sleep deprivation.  HTN, stroke, and heart failure are potential risk factors.     Plan: Continue with CPAP 16cm H2O with 2L [place] O2 at night. Patient with 100% compliance, average use 8.4hrs, average AHI 2.6 - data for 90 days of use           Updated Medication List Outpatient Encounter Prescriptions as of 10/01/2014  Medication Sig  . albuterol (PROVENTIL) (2.5 MG/3ML) 0.083% nebulizer solution Take 3 mLs (2.5 mg total) by nebulization every 6 (six) hours as needed for wheezing or shortness of breath.  [place] Kitchen amLODipine (NORVASC) 5 MG tablet Take 1 tablet by mouth daily.  . budesonide-formoterol (SYMBICORT) 160-4.5 MCG/ACT inhaler Inhale 2 puffs into the lungs 2 (two) times daily.  . Fluticasone-Salmeterol (ADVAIR DISKUS) 250-50 MCG/DOSE AEPB Inhale 1 puff into the lungs 2 (two) times daily.  . Fluticasone-Salmeterol (ADVAIR) 500-50 MCG/DOSE AEPB Inhale 1 puff into the lungs every 12 (twelve) hours.  . furosemide (LASIX) 20 MG tablet Take 1 tablet (20 mg total) by mouth daily. (Patient taking differently: Take 20 mg by mouth every other day. )  . HYDROcodone-homatropine (HYCODAN) 5-1.5 MG/5ML syrup 5 ml every 6 hours if needed for cough  . meloxicam (MOBIC) 15 MG tablet TAKE 1 TABLET (15 MG TOTAL) BY MOUTH ONCE DAILY.  . Multiple Vitamin (MULTIVITAMIN) capsule Take 1 capsule by mouth daily.  . nicotine (NICODERM CQ - DOSED IN MG/24 HOURS) 21 mg/24hr patch Place 1 patch (21 mg total) onto the skin daily.  . OXYGEN Inhale 2 L into the lungs daily. 2 liters daily  . PROAIR HFA 108 (90 BASE) MCG/ACT inhaler INHALE 2 PUFFS BY MOUTH 4 TIMES A DAY AS NEEDED  .  Vitamin D, Ergocalciferol, (DRISDOL) 50000 UNITS CAPS capsule Take 1 capsule by mouth once a week.   No facility-administered encounter medications on file as of 10/01/2014.    Orders for this visit: No orders of the defined types were placed in this encounter.    Thank  you for the visitation and for allowing  [facility] to assist in the care of your patient. Our recommendations are noted above.  Please contact us if we can be of further service.  Vilinda Boehringer, MD [facility] Office Number: [phone]

## 2014-10-03 ENCOUNTER — Encounter: Payer: 59 | Admitting: *Deleted

## 2014-10-03 DIAGNOSIS — J449 Chronic obstructive pulmonary disease, unspecified: Secondary | ICD-10-CM

## 2014-10-03 DIAGNOSIS — J432 Centrilobular emphysema: Secondary | ICD-10-CM | POA: Diagnosis not present

## 2014-10-03 DIAGNOSIS — G4730 Sleep apnea, unspecified: Secondary | ICD-10-CM

## 2014-10-03 NOTE — Progress Notes (Signed)
Daily Session Note  Patient Details  Name: Kelly Hale MRN: [id number] Date of Birth: 11/07/1955 Referring Provider:  Harlan Stains, MD  Encounter Date: 10/03/2014  Check In:     Session Check In - 10/03/14 1154    Check-In   Staff Present Frederich Cha RRT, RCP Respiratory Therapist;Carroll Enterkin RN, Drusilla Kanner MS, ACSM CEP Exercise Physiologist   ER physicians immediately available to respond to emergencies [facility] immediately available ER MD   Physician(s) Archie Balboa and Corky Downs   Medication changes reported     No   Fall or balance concerns reported    No   Warm-up and Cool-down Performed on first and last piece of equipment   VAD Patient? No   Pain Assessment   Currently in Pain? No/denies   Multiple Pain Sites No         Goals Met:  Independence with exercise equipment Using PLB without cueing & demonstrates good technique Exercise tolerated well Strength training completed today  Goals Unmet:  Not Applicable  Goals Comments:    Dr. Emily Filbert is Medical Director for [place] and [facility].

## 2014-10-06 ENCOUNTER — Encounter: Payer: 59 | Admitting: *Deleted

## 2014-10-06 DIAGNOSIS — G4730 Sleep apnea, unspecified: Secondary | ICD-10-CM

## 2014-10-06 DIAGNOSIS — J432 Centrilobular emphysema: Secondary | ICD-10-CM

## 2014-10-06 NOTE — Progress Notes (Signed)
Daily Session Note  Patient Details  Name: LEVONIA WOLFLEY MRN: [id number] Date of Birth: July 17, 1955 Referring Provider:  Harlan Stains, MD  Encounter Date: 10/06/2014  Check In:     Session Check In - 10/06/14 1208    Check-In   Staff Present Carson Myrtle BS, RRT, Respiratory Therapist;Steven Way BS, ACSM EP-C, Exercise Physiologist;Carroll Enterkin RN, BSN   ER physicians immediately available to respond to emergencies [facility] immediately available ER MD   Physician(s) Dr. Edd Fabian and Dr. Reita Cliche   Medication changes reported     No   Fall or balance concerns reported    No   Warm-up and Cool-down Performed on first and last piece of equipment   VAD Patient? No   Pain Assessment   Currently in Pain? Yes         Goals Met:  Proper associated with RPD/PD & O2 Sat Independence with exercise equipment Using PLB without cueing & demonstrates good technique Exercise tolerated well Personal goals reviewed Strength training completed today  Goals Unmet:  Not Applicable  Goals Comments:   Ms Larue had an appointment with PT on 10/03/14. They will get a prescription for more sessions for her shoulder.  Dr. Emily Filbert is Medical Director for [place] and [facility].

## 2014-10-06 NOTE — Progress Notes (Signed)
Daily Session Note  Patient Details  Name: Kelly Hale MRN: [id number] Date of Birth: 1955/04/21 Referring Provider:  Harlan Stains, MD  Encounter Date: 10/06/2014  Check In:     Session Check In - 10/06/14 1208    Check-In   Staff Present Carson Myrtle BS, RRT, Respiratory Therapist;Steven Way BS, ACSM EP-C, Exercise Physiologist;Ayomide Purdy RN, BSN   ER physicians immediately available to respond to emergencies [facility] immediately available ER MD   Physician(s) Dr. Edd Fabian and Dr. Reita Cliche   Medication changes reported     No   Fall or balance concerns reported    No   Warm-up and Cool-down Performed on first and last piece of equipment   VAD Patient? No   Pain Assessment   Currently in Pain? Yes         Goals Met:  Proper associated with RPD/PD & O2 Sat Exercise tolerated well  Goals Unmet:  Not Applicable  Goals Comments:    Dr. Emily Filbert is Medical Director for [place] and [facility].

## 2014-10-08 ENCOUNTER — Encounter: Payer: 59 | Admitting: *Deleted

## 2014-10-08 VITALS — Ht 67.0 in | Wt 278.0 lb

## 2014-10-08 DIAGNOSIS — J432 Centrilobular emphysema: Secondary | ICD-10-CM | POA: Diagnosis not present

## 2014-10-08 DIAGNOSIS — G4733 Obstructive sleep apnea (adult) (pediatric): Secondary | ICD-10-CM

## 2014-10-08 DIAGNOSIS — J449 Chronic obstructive pulmonary disease, unspecified: Secondary | ICD-10-CM

## 2014-10-08 NOTE — Progress Notes (Signed)
Daily Session Note  Patient Details  Name: Kelly Hale MRN: [id number] Date of Birth: 07/27/1955 Referring Provider:  White, Cynthia, MD  Encounter Date: 10/08/2014  Check In:     Session Check In - 10/08/14 1140    Check-In   Staff Present Steven Way BS, ACSM EP-C, Exercise Physiologist;Renee MacMillan MS, ACSM CEP Exercise Physiologist;Laureen Brown BS, RRT, Respiratory Therapist   ER physicians immediately available to respond to emergencies [facility] immediately available ER MD   Physician(s) Goodman and Stafford   Medication changes reported     No   Fall or balance concerns reported    No   Warm-up and Cool-down Performed on first and last piece of equipment   VAD Patient? No   Pain Assessment   Currently in Pain? No/denies   Multiple Pain Sites No         Goals Met:  Proper associated with RPD/PD & O2 Sat Independence with exercise equipment Using PLB without cueing & demonstrates good technique Exercise tolerated well Personal goals reviewed Strength training completed today  Goals Unmet:  Not Applicable  Goals Comments:    Dr. Mark Miller is Medical Director for [facility] and [facility]. 

## 2014-10-10 ENCOUNTER — Encounter: Payer: 59 | Attending: Family Medicine | Admitting: Respiratory Therapy

## 2014-10-10 DIAGNOSIS — J432 Centrilobular emphysema: Secondary | ICD-10-CM | POA: Diagnosis present

## 2014-10-10 DIAGNOSIS — G4730 Sleep apnea, unspecified: Secondary | ICD-10-CM

## 2014-10-10 DIAGNOSIS — J438 Other emphysema: Secondary | ICD-10-CM

## 2014-10-10 NOTE — Progress Notes (Signed)
Daily Session Note  Patient Details  Name: Kelly Hale MRN: [id number] Date of Birth: 03/05/1956 Referring Provider:  Harlan Stains, MD  Encounter Date: 10/10/2014  Check In:     Session Check In - 10/10/14 1156    Check-In   Staff Present Frederich Cha RRT, RCP Respiratory Therapist;Carroll Enterkin RN, Drusilla Kanner MS, ACSM CEP Exercise Physiologist   ER physicians immediately available to respond to emergencies [facility] immediately available ER MD   Physician(s) Dr. Thomasene Lot and Dr. Clearnce Hasten   Medication changes reported     No   Fall or balance concerns reported    No   Warm-up and Cool-down Performed on first and last piece of equipment   VAD Patient? No   Pain Assessment   Currently in Pain? No/denies   Multiple Pain Sites No         Goals Met:  Proper associated with RPD/PD & O2 Sat Independence with exercise equipment Exercise tolerated well  Goals Unmet:  Not Applicable  Goals Comments:    Dr. Emily Filbert is Medical Director for [place] and [facility].

## 2014-10-15 ENCOUNTER — Encounter: Payer: 59 | Admitting: *Deleted

## 2014-10-15 ENCOUNTER — Encounter: Payer: Self-pay | Admitting: Internal Medicine

## 2014-10-15 DIAGNOSIS — J432 Centrilobular emphysema: Secondary | ICD-10-CM | POA: Diagnosis not present

## 2014-10-15 DIAGNOSIS — G4730 Sleep apnea, unspecified: Secondary | ICD-10-CM

## 2014-10-15 DIAGNOSIS — J449 Chronic obstructive pulmonary disease, unspecified: Secondary | ICD-10-CM

## 2014-10-15 NOTE — Progress Notes (Signed)
Daily Session Note  Patient Details  Name: Kelly Hale MRN: [id number] Date of Birth: 07/15/1955 Referring Provider:  White, Cynthia, MD  Encounter Date: 10/15/2014  Check In:     Session Check In - 10/15/14 1145    Check-In   Staff Present Laureen Brown BS, RRT, Respiratory Therapist;Steven Way BS, ACSM EP-C, Exercise Physiologist;Renee MacMillan MS, ACSM CEP Exercise Physiologist;Carroll Enterkin RN, BSN   ER physicians immediately available to respond to emergencies [facility] immediately available ER MD   Physician(s) Stafford and [place]   Medication changes reported     No   Fall or balance concerns reported    No   Warm-up and Cool-down Performed on first and last piece of equipment   VAD Patient? No   Pain Assessment   Currently in Pain? No/denies   Multiple Pain Sites No           Exercise Prescription Changes - 10/15/14 1100    Exercise Review   Progression Yes   Response to Exercise   Blood Pressure (Admit) 144/78 mmHg   Blood Pressure (Exercise) 170/88 mmHg   Blood Pressure (Exit) 114/68 mmHg   Heart Rate (Admit) 117 bpm   Heart Rate (Exercise) 122 bpm   Heart Rate (Exit) 104 bpm   Oxygen Saturation (Admit) 95 %   Oxygen Saturation (Exercise) 93 %  3l/m continous   Oxygen Saturation (Exit) 93 %   Rating of Perceived Exertion (Exercise) 15   Perceived Dyspnea (Exercise) 3   Treadmill   MPH 2.5   Grade 1   Minutes 15   NuStep   Level 6   Watts 65   Minutes 15   Recumbant Elliptical   Level 5   RPM 50      Goals Met:  Proper associated with RPD/PD & O2 Sat Independence with exercise equipment Using PLB without cueing & demonstrates good technique Exercise tolerated well Personal goals reviewed Strength training completed today  Goals Unmet:  Not Applicable  Goals Comments:    Dr. Mark Miller is Medical Director for [facility] and [facility]. 

## 2014-10-15 NOTE — Progress Notes (Signed)
Pulmonary Individual Treatment Plan  Patient Details  Name: Kelly Hale MRN: [id number] Date of Birth: 15-Jan-1956 Referring Provider:  Dr. Vilinda Boehringer  Initial Encounter Date: 06/24/2014  Visit Diagnosis: Mild COPD, Sleep Apnea  Patient's Home Medications on Admission:  Current outpatient prescriptions:    albuterol (PROVENTIL) (2.5 MG/3ML) 0.083% nebulizer solution, Take 3 mLs (2.5 mg total) by nebulization every 6 (six) hours as needed for wheezing or shortness of breath., Disp: 100 vial, Rfl: 11   amLODipine (NORVASC) 5 MG tablet, Take 1 tablet by mouth daily., Disp: , Rfl:    Fluticasone-Salmeterol (ADVAIR DISKUS) 500-50 MCG/DOSE AEPB, Inhale 1 puff into the lungs 2 (two) times daily., Disp: 60 each, Rfl: 3   furosemide (LASIX) 20 MG tablet, Take 1 tablet (20 mg total) by mouth daily. (Patient taking differently: Take 20 mg by mouth every other day. ), Disp: 90 tablet, Rfl: 3   Multiple Vitamin (MULTIVITAMIN) capsule, Take 1 capsule by mouth daily., Disp: , Rfl:    OXYGEN, Inhale 2 L into the lungs daily. 2 liters daily, Disp: , Rfl:    PROAIR HFA 108 (90 BASE) MCG/ACT inhaler, INHALE 2 PUFFS BY MOUTH 4 TIMES A DAY AS NEEDED, Disp: , Rfl: 0   Vitamin D, Cholecalciferol, 1000 UNITS CAPS, Take 1 capsule by mouth daily., Disp: , Rfl:   Past Medical History: Past Medical History  Diagnosis Date   Asthma    Hyperlipidemia    Aspirin allergy    Morbid obesity    Tobacco abuse    CHF (congestive heart failure)     Tobacco Use: History  Smoking status   Current Every Day Smoker -- 1.00 packs/day for 30 years   Types: Cigarettes  Smokeless tobacco   Never Used    Comment: quit in Sept 2015, but started back smoking up to 2 cigs per day    Labs: Recent Review Flowsheet Data    There is no flowsheet data to display.       ADL [facility]:     ADL [facility]      06/24/14 1130 06/24/14 1458 10/15/14 1130   ADL [facility]   ADL Phase Entry Mid    SOB Score total 45  64 39   Rest 0 0 0   Walk $Rem'1 1 1   'tOOv$ Stairs $Re'3 5 4   'WAV$ Bath $R'3 3 1   'kd$ Dress $'3 3 1   'z$ Shop '2 2 2       '$ Pulmonary Function Assessment:   Exercise Target Goals:    Exercise Program Goal: Individual exercise prescription set with THRR, safety & activity barriers. Participant demonstrates ability to understand and report RPE using BORG scale, to self-measure pulse accurately, and to acknowledge the importance of the exercise prescription.  Exercise Prescription Goal: Starting with aerobic activity 30 plus minutes a day, 3 days per week for initial exercise prescription. Provide home exercise prescription and guidelines that participant acknowledges understanding prior to discharge.  Activity Barriers & Risk Stratification:   6 Minute Walk:     6 Minute Walk      06/24/14 1215 08/22/14 1300 10/08/14 1224   6 Minute Walk   Phase Initial Mid Program Discharge   Distance 1160 feet 1015 feet 1248 feet   Walk Time 6 minutes 6 minutes 6 minutes   Resting HR 98 bpm 105 bpm 104 bpm   Resting BP 138/80 mmHg 152/88 mmHg 140/80 mmHg   Max Ex. HR 129 bpm 116 bpm 126 bpm   Max  Ex. BP 154/88 mmHg 156/88 mmHg 162/76 mmHg   RPE $Re'12 11 10   'EUI$ Perceived Dyspnea  3 3.5 3   Symptoms  No No      Initial Exercise Prescription:   Exercise Prescription Changes:     Exercise Prescription Changes      08/18/14 1200 09/01/14 1000 09/01/14 1300 09/05/14 1200 09/15/14 1200   Exercise Review   Progression  Yes Yes  Yes   Response to Exercise   Blood Pressure (Admit)  100/80 mmHg 100/80 mmHg     Blood Pressure (Exercise)  142/80 mmHg 142/80 mmHg     Blood Pressure (Exit)  134/80 mmHg 134/80 mmHg     Heart Rate (Admit)   109 bpm     Heart Rate (Exercise)   120 bpm     Heart Rate (Exit)   106 bpm     Oxygen Saturation (Admit)   93 %     Oxygen Saturation (Exercise)   90 %     Oxygen Saturation (Exit)   94 %     Rating of Perceived Exertion (Exercise)   12     Perceived Dyspnea (Exercise)   3      Intensity  THRR unchanged THRR unchanged     Progression  Continue progressive overload as per policy without signs/symptoms or physical distress. Continue progressive overload as per policy without signs/symptoms or physical distress.     Resistance Training   Training Prescription  Yes Yes     Weight  3 3     Reps  10-12 10-12     Treadmill   MPH  2.2 2.2  2.5   Grade  $Remo'1 1  1   'DvFBs$ Minutes  15 15     NuStep   Level  5 5     Watts  55 55     Minutes  15 15     Recumbant Elliptical   Level $Remo'3 3 3 4 'RefAF$ 4.5   RPM 45 45 45 50 50   Minutes  $Remove'15 15 15      'mIeLHHb$ 09/17/14 1200 09/24/14 1200 09/24/14 1500 10/15/14 1100 10/15/14 1500   Exercise Review   Progression  Yes Yes Yes Yes   Response to Exercise   Blood Pressure (Admit)   144/78 mmHg 144/78 mmHg 150/82 mmHg   Blood Pressure (Exercise)   170/88 mmHg 170/88 mmHg 164/80 mmHg   Blood Pressure (Exit)   114/68 mmHg 114/68 mmHg 122/76 mmHg   Heart Rate (Admit)   117 bpm 117 bpm 105 bpm   Heart Rate (Exercise)   122 bpm 122 bpm 117 bpm   Heart Rate (Exit)   104 bpm 104 bpm 95 bpm   Oxygen Saturation (Admit)   95 % 95 % 93 %  3l/m continuous   Oxygen Saturation (Exercise)   93 %  3l/m continous 93 %  3l/m continous 95 %   Oxygen Saturation (Exit)   93 % 93 % 97 %   Rating of Perceived Exertion (Exercise)   '15 15 14   '$ Perceived Dyspnea (Exercise)   '3 3 3   '$ Frequency     Add 2 additional days to program exercise sessions.   Intensity     Rest + 30   Progression     Continue progressive overload as per policy without signs/symptoms or physical distress.   Resistance Training   Training Prescription     Yes   Weight     3   Reps  10-12   Treadmill   MPH 2.5 2.5  2.5 2.5   Grade $Remo'1 1  1 1   'vNrNC$ Minutes $Rem'15 15  15 15   'nxZl$ NuStep   Level $Remo'6 6  6 6   'Jgkzf$ Watts 65 65  65 65   Minutes $Remove'15 15  15 15   'ATlJgqt$ Recumbant Elliptical   Level 4.$Remove'5 5  5 5   'JKhRDck$ RPM 50 50  50 50   Minutes 15    15      Discharge Exercise Prescription (Final Exercise Prescription Changes):      Exercise Prescription Changes - 10/15/14 1500    Exercise Review   Progression Yes   Response to Exercise   Blood Pressure (Admit) 150/82 mmHg   Blood Pressure (Exercise) 164/80 mmHg   Blood Pressure (Exit) 122/76 mmHg   Heart Rate (Admit) 105 bpm   Heart Rate (Exercise) 117 bpm   Heart Rate (Exit) 95 bpm   Oxygen Saturation (Admit) 93 %  3l/m continuous   Oxygen Saturation (Exercise) 95 %   Oxygen Saturation (Exit) 97 %   Rating of Perceived Exertion (Exercise) 14   Perceived Dyspnea (Exercise) 3   Frequency Add 2 additional days to program exercise sessions.   Intensity Rest + 30   Progression Continue progressive overload as per policy without signs/symptoms or physical distress.   Resistance Training   Training Prescription Yes   Weight 3   Reps 10-12   Treadmill   MPH 2.5   Grade 1   Minutes 15   NuStep   Level 6   Watts 65   Minutes 15   Recumbant Elliptical   Level 5   RPM 50   Minutes 15       Nutrition:  Target Goals: Understanding of nutrition guidelines, daily intake of sodium '1500mg'$ , cholesterol '200mg'$ , calories 30% from fat and 7% or less from saturated fats, daily to have 5 or more servings of fruits and vegetables.  Biometrics:      Post Biometrics - 10/08/14 1225     Post  Biometrics   Height $Remov'5\' 7"'ZCRlQI$  (1.702 m)   Weight 278 lb (126.1 kg)   Waist Circumference 46 inches   Hip Circumference 56 inches   Waist to Hip Ratio 0.82 %   BMI (Calculated) 43.6      Nutrition Therapy Plan and Nutrition Goals:   Nutrition Discharge: Rate Your Plate Scores:   Psychosocial: Target Goals: Acknowledge presence or absence of depression, maximize coping skills, provide positive support system. Participant is able to verbalize types and ability to use techniques and skills needed for reducing stress and depression.  Initial Review & Psychosocial Screening:   Quality of Life Scores:  GD04 Despression Questionnaire Results      Pre 3    Post 8    SF36                                                                   Pre           Post             Physical Fx             13  18                Role Fx: Physical    4   4              Bodily Pain     4   9    General Health    11   13   Vitality     16   14    Social Fx     8            6   Role Fx: Emotional    6   6   Mental Health    25   28    PHQ-9:     Recent Review Flowsheet Data    There is no flowsheet data to display.      Psychosocial Evaluation and Intervention:     Psychosocial Evaluation - 10/08/14 1140    Discharge Psychosocial Assessment & Intervention   Comments Counselor met wtih Ms. Fiorenza today for follow up evaluation prior to discharge.  She reports she is sleeping better now that she got her mask fixed and that helps everything!  She has made a great deal of progress in this class with increased stamina and less shortness of breath.  She reports vacuuming and performing normal household duties without having to use her oxygen as much.  She also reports she has lost some weight since beginning this program.  Ms. Weatherly plans to [facility] her progress by joining one of the follow-up programs designed for consistency in exercise.         Psychosocial Re-Evaluation:  Education: Education Goals: Education classes will be provided on a weekly basis, covering required topics. Participant will state understanding/return demonstration of topics presented.  Learning Barriers/Preferences:   Education Topics: Initial Evaluation Education: - Verbal, written and demonstration of respiratory meds, RPE/PD scales, oximetry and breathing techniques. Instruction on use of nebulizers and MDIs: cleaning and proper use, rinsing mouth with steroid doses and importance of monitoring MDI activations.   General Nutrition Guidelines/Fats and Fiber: -Group instruction provided by verbal, written material, models and posters to present the general guidelines  for heart healthy nutrition. Gives an explanation and review of dietary fats and fiber.   Controlling Sodium/Reading Food Labels: -Group verbal and written material supporting the discussion of sodium use in heart healthy nutrition. Review and explanation with models, verbal and written materials for utilization of the food label.   Exercise Physiology & Risk Factors: - Group verbal and written instruction with models to review the exercise physiology of the cardiovascular system and associated critical values. Details cardiovascular disease risk factors and the goals associated with each risk factor.   Aerobic Exercise & Resistance Training: - Gives group verbal and written discussion on the health impact of inactivity. On the components of aerobic and resistive training programs and the benefits of this training and how to safely progress through these programs.   Flexibility, Balance, General Exercise Guidelines: - Provides group verbal and written instruction on the benefits of flexibility and balance training programs. Provides general exercise guidelines with specific guidelines to those with heart or lung disease. Demonstration and skill practice provided.   Stress Management: - Provides group verbal and written instruction about the health risks of elevated stress, cause of high stress, and healthy ways to reduce stress.   Depression: - Provides group verbal and written instruction on the correlation between heart/lung disease and depressed mood, treatment options, and the stigmas  associated with seeking treatment.   Exercise & Equipment Safety: - Individual verbal instruction and demonstration of equipment use and safety with use of the equipment.   Infection Prevention: - Provides verbal and written material to individual with discussion of infection control including proper hand washing and proper equipment cleaning during exercise session.   Falls Prevention: -  Provides verbal and written material to individual with discussion of falls prevention and safety.   Diabetes: - Individual verbal and written instruction to review signs/symptoms of diabetes, desired ranges of glucose level fasting, after meals and with exercise. Advice that pre and post exercise glucose checks will be done for 3 sessions at entry of program.   Chronic Lung Diseases: - Group verbal and written instruction to review new updates, new respiratory medications, new advancements in procedures and treatments. Provide informative websites and "800" numbers of self-education.   Lung Procedures: - Group verbal and written instruction to describe testing methods done to diagnose lung disease. Review the outcome of test results. Describe the treatment choices: Pulmonary Function Tests, ABGs and oximetry.   Energy Conservation: - Provide group verbal and written instruction for methods to conserve energy, plan and organize activities. Instruct on pacing techniques, use of adaptive equipment and posture/positioning to relieve shortness of breath.   Triggers: - Group verbal and written instruction to review types of environmental controls: home humidity, furnaces, filters, dust mite/pet prevention, HEPA vacuums. To discuss weather changes, air quality and the benefits of nasal washing.   Exacerbations: - Group verbal and written instruction to provide: warning signs, infection symptoms, calling MD promptly, preventive modes, and value of vaccinations. Review: effective airway clearance, coughing and/or vibration techniques. Create an Sports administrator.   Oxygen: - Individual and group verbal and written instruction on oxygen therapy. Includes supplement oxygen, available portable oxygen systems, continuous and intermittent flow rates, oxygen safety, concentrators, and Medicare reimbursement for oxygen.   Respiratory Medications: - Group verbal and written instruction to review  medications for lung disease. Drug class, frequency, complications, importance of spacers, rinsing mouth after steroid MDI's, and proper cleaning methods for nebulizers.   AED/CPR: - Group verbal and written instruction with the use of models to demonstrate the basic use of the AED with the basic ABC's of resuscitation.   Breathing Retraining: - Provides individuals verbal and written instruction on purpose, frequency, and proper technique of diaphragmatic breathing and pursed-lipped breathing. Applies individual practice skills.   Anatomy and Physiology of the Lungs: - Group verbal and written instruction with the use of models to provide basic lung anatomy and physiology related to function, structure and complications of lung disease.   Heart Failure: - Group verbal and written instruction on the basics of heart failure: signs/symptoms, treatments, explanation of ejection fraction, enlarged heart and cardiomyopathy.   Sleep Apnea: - Individual verbal and written instruction to review Obstructive Sleep Apnea. Review of risk factors, methods for diagnosing and types of masks and machines for OSA.   Anxiety: - Provides group, verbal and written instruction on the correlation between heart/lung disease and anxiety, treatment options, and management of anxiety.   Relaxation: - Provides group, verbal and written instruction about the benefits of relaxation for patients with heart/lung disease. Also provides patients with examples of relaxation techniques.   Knowledge Questionnaire Score:   Personal Goals and Risk Factors at Admission:     Personal Goals and Risk Factors at Admission - 06/24/14 1130    Personal Goals and Risk Factors on Admission    Weight  Management Yes   Intervention Learn and follow the exercise and diet guidelines while in the program. Utilize the nutrition and education classes to help gain knowledge of the diet and exercise expectations in the program    Admit Weight 275 lb (124.739 kg)   Goal Weight 160 lb (72.576 kg)   Increase Aerobic Exercise and Physical Activity Yes   Intervention While in program, learn and follow the exercise prescription taught. Start at a low level workload and increase workload after able to maintain previous level for 30 minutes. Increase time before increasing intensity.   Quit Smoking Yes   Intervention Utilize your health care professional team to help with smoking cessation while in the program. Your doctor can prescribe medications to aid in cessation. The program can provide information and counseling as needed.   Understand more about Heart/Pulmonary Disease. Yes   Intervention While in program utilize professionals for any questions, and attend the education sessions. Great websites to use are www.americanheart.org or www.lung.org for reliable information.   Improve shortness of breath with ADL's Yes   Intervention While in program, learn and follow the exercise prescription taught. Start at a low level workload and increase workload ad advised by the exercise physiologist. Increase time before increasing intensity.   Develop more efficient breathing techniques such as purse lipped breathing and diaphragmatic breathing; and practicing self-pacing with activity Yes   Intervention While in program, learn and utilize the specific breathing techniques taught to you. Continue to practice and use the techniques as needed.   Increase knowledge of respiratory medications and ability to use respiratory devices properly.  Yes   Intervention While in program learn and demonstrate appropriate use of your oxygen therapy by increasing flow with exertion, manage oxygen tank operation, including continuous and intermittent flow.  Understanding oxygen is a drug ordered by your physician.;While in program, learn to administer MDI, nebulizer, and spacer properly.;Learn to take respiratory medicine as ordered.;While in program, learn to  Clean MDI, nebulizers, and spacers properly.      Personal Goals and Risk Factors Review:      Goals and Risk Factor Review      08/27/14 1130 09/12/14 1620 09/15/14 1213 09/15/14 1440 09/17/14 1130   Increase Aerobic Exercise and Physical Activity   Goals Progress/Improvement seen    Yes     Comments   Discussed [employer] and the [employer]. Suzi Roots may join it or may purchase a treadmill for use at home. Given the scholarship info for [employer].      Quit Smoking   Goals Progress/Improvement seen  No No No No   Comments 2-3cig/day; she smokes randomly during the day to help with cessation of smoking. still smoking 2-3cig/day Still smoking 2-3 cigarettes/day Continues smoking 2-3cig/day Ms Noack is still smoking 2-3 cig/d and has no quit date set.     09/19/14 1237 09/24/14 1257 09/24/14 1459 09/26/14 1455 10/01/14 1130   Weight Management   Goals Progress/Improvement seen  No      Comments  Deb is frustrated with little progress on losing weight. She has only lost a few pounds in the last month and a half and is discouraged. We discussed caloric balance and how weight loss works and the caloric deficit exercise creates, but that nutrition is more powerful in creating a caloric deficit. We spent a lot of time discusing how many calories are expened during her exercise and high calorie foods to avoid.       Increase Aerobic  Exercise and Physical Activity   Goals Progress/Improvement seen  Yes Yes      Comments  Deb is consistently increasing her workloads on all the equipment and works very hard during exercise.       Quit Smoking   Goals Progress/Improvement seen No  No No Yes   Comments Still is smoking 2-3 cigarettes/day  Smoking 2-3cig/day; gave her the smoking cessation literature from [place] still smoking 2-3 cig/day Ms Petras had a friend visit her who had quit smoking with Chantix. Ms Huntley is will to try Chantix to quit smoking completely.   Understand more  about Heart/Pulmonary Disease   Goals Progress/Improvement seen   Yes      Comments  Suzi Roots has enjoyed the educational lectures and feels more comfortable managing her disease.       Improve shortness of breath with ADL's   Goals Progress/Improvement seen   Yes      Breathing Techniques   Goals Progress/Improvement seen     Yes    Comments    Ms. Cousar demonstrates proper technique for PLB and uses it when apporate,    Increase knowledge of respiratory medications   Goals Progress/Improvement seen     Yes    Comments    Went over proper technique for use of MDI and spacer and to rinse mouth out after taking Advair.    Other Goal   Goals Progress/Improvement seen     Yes    Comments    Oxygen.  [place] seems to be very confident in setting up and adjusting her oxygen with use for exercise.  She states that she has no question about O2.      10/08/14 1130           Increase Aerobic Exercise and Physical Activity   Goals Progress/Improvement seen  Yes       Comments Ms Noblett improved her 22mwd by 11ft with her post walk.          Personal Goals Discharge:      Personal Goals at Discharge - 10/15/14 1130    Weight Management   Goals Progress/Improvement seen No   Comments Ms Ingles has maintained an average weight between 278-280.    Increase Aerobic Exercise and Physical Activity   Goals Progress/Improvement seen  Yes   Comments Ms Foronda has increased her exercise goals on the TM, REL, and T5; she has increased her weights from 1 to 3lbs; Ms Banwart has also increased stamina and endurance with home activites.   Quit Smoking   Goals Progress/Improvement seen No   Comments She is still smoking 2-3 cig/day, although she wants to quit, but no quit date.   Improve shortness of breath with ADL's   Goals Progress/Improvement seen  Yes   Comments Ms Schiele's [facility] Shortness of Breath questionnaire improved by 6 points; Minimal Important Difference is 5 points.   Breathing Techniques   Goals  Progress/Improvement seen  Yes   Comments Ms San uses PLB with activity and finds it very helpful.   Increase knowledge of respiratory medications   Goals Progress/Improvement seen  Yes   Comments Ms Croghan has a good understanding of her MDI's and oxygen.      Comments: Ms Siess will continue exercise at [facility].  Thank you for the opportunity to work with your patient, Ms Kaelie Henigan.

## 2014-10-15 NOTE — Progress Notes (Signed)
Discharge Summary  Patient Details  Name: Kelly Hale MRN: [id number] Date of Birth: 07-07-1955 Referring Provider:  Dr Kelly Hale   Number of Visits: **36*  Reason for Discharge:  Patient reached a stable level of exercise. Patient independent in their exercise.  Smoking History:  History  Smoking status   Current Every Day Smoker -- 1.00 packs/day for 30 years   Types: Cigarettes  Smokeless tobacco   Never Used    Comment: quit in Sept 2015, but started back smoking up to 2 cigs per day    Diagnosis:  No diagnosis found.  ADL [facility]:     ADL [facility]      06/24/14 1130 06/24/14 1458 10/15/14 1130   ADL [facility]   ADL Phase Entry Mid    SOB Score total 45 64 39   Rest 0 0 0   Walk 1 1 1    Stairs 3 5 4    Bath 3 3 1    Dress 3 3 1    Shop 2 2 2       Initial Exercise Prescription:   Discharge Exercise Prescription (Final Exercise Prescription Changes):     Exercise Prescription Changes - 10/15/14 1500    Exercise Review   Progression Yes   Response to Exercise   Blood Pressure (Admit) 150/82 mmHg   Blood Pressure (Exercise) 164/80 mmHg   Blood Pressure (Exit) 122/76 mmHg   Heart Rate (Admit) 105 bpm   Heart Rate (Exercise) 117 bpm   Heart Rate (Exit) 95 bpm   Oxygen Saturation (Admit) 93 %  3l/m continuous   Oxygen Saturation (Exercise) 95 %   Oxygen Saturation (Exit) 97 %   Rating of Perceived Exertion (Exercise) 14   Perceived Dyspnea (Exercise) 3   Frequency Add 2 additional days to program exercise sessions.   Intensity Rest + 30   Progression Continue progressive overload as per policy without signs/symptoms or physical distress.   Resistance Training   Training Prescription Yes   Weight 3   Reps 10-12   Treadmill   MPH 2.5   Grade 1   Minutes 15   NuStep   Level 6   Watts 65   Minutes 15   Recumbant Elliptical   Level 5   RPM 50   Minutes 15      Functional Capacity:     6 Minute Walk      06/24/14 1215 08/22/14 1300  10/08/14 1224   6 Minute Walk   Phase Initial Mid Program Discharge   Distance 1160 feet 1015 feet 1248 feet   Walk Time 6 minutes 6 minutes 6 minutes   Resting HR 98 bpm 105 bpm 104 bpm   Resting BP 138/80 mmHg 152/88 mmHg 140/80 mmHg   Max Ex. HR 129 bpm 116 bpm 126 bpm   Max Ex. BP 154/88 mmHg 156/88 mmHg 162/76 mmHg   RPE 12 11 10    Perceived Dyspnea  3 3.5 3   Symptoms  No No      Psychological, QOL, Others - Outcomes: PHQ 2/9: No flowsheet data found.  Quality of Life:    GD04 Despression Questionnaire Results      Pre 3    Post 8   SF36  Pre           Post             Physical Fx             13                  18                Role Fx: Physical    4   4              Bodily Pain     4   9    General Health    11   13   Vitality     16   14    Social Fx     8            6   Role Fx: Emotional    6   6   Mental Health    25   28     Personal Goals: Goals established at orientation with interventions provided to work toward goal.     Personal Goals and Risk Factors at Admission - 06/24/14 1130    Personal Goals and Risk Factors on Admission    Weight Management Yes   Intervention Learn and follow the exercise and diet guidelines while in the program. Utilize the nutrition and education classes to help gain knowledge of the diet and exercise expectations in the program   Admit Weight 275 lb (124.739 kg)   Goal Weight 160 lb (72.576 kg)   Increase Aerobic Exercise and Physical Activity Yes   Intervention While in program, learn and follow the exercise prescription taught. Start at a low level workload and increase workload after able to maintain previous level for 30 minutes. Increase time before increasing intensity.   Quit Smoking Yes   Intervention Utilize your health care professional team to help with smoking cessation while in the program. Your doctor can prescribe medications to aid in  cessation. The program can provide information and counseling as needed.   Understand more about Heart/Pulmonary Disease. Yes   Intervention While in program utilize professionals for any questions, and attend the education sessions. Great websites to use are www.americanheart.org or www.lung.org for reliable information.   Improve shortness of breath with ADL's Yes   Intervention While in program, learn and follow the exercise prescription taught. Start at a low level workload and increase workload ad advised by the exercise physiologist. Increase time before increasing intensity.   Develop more efficient breathing techniques such as purse lipped breathing and diaphragmatic breathing; and practicing self-pacing with activity Yes   Intervention While in program, learn and utilize the specific breathing techniques taught to you. Continue to practice and use the techniques as needed.   Increase knowledge of respiratory medications and ability to use respiratory devices properly.  Yes   Intervention While in program learn and demonstrate appropriate use of your oxygen therapy by increasing flow with exertion, manage oxygen tank operation, including continuous and intermittent flow.  Understanding oxygen is a drug ordered by your physician.;While in program, learn to administer MDI, nebulizer, and spacer properly.;Learn to take respiratory medicine as ordered.;While in program, learn to Clean MDI, nebulizers, and spacers properly.       Personal Goals Discharge:     Personal Goals at Discharge - 10/15/14 1130    Weight Management   Goals Progress/Improvement seen No   Comments Kelly Hale has maintained an  average weight between 278-280.    Increase Aerobic Exercise and Physical Activity   Goals Progress/Improvement seen  Yes   Comments Kelly Hale has increased her exercise goals on the TM, REL, and T5; she has increased her weights from 1 to 3lbs; Kelly Hale has also increased stamina and endurance with home  activites.   Quit Smoking   Goals Progress/Improvement seen No   Comments She is still smoking 2-3 cig/day, although she wants to quit, but no quit date.   Improve shortness of breath with ADL's   Goals Progress/Improvement seen  Yes   Comments Kelly Hale's [facility] Shortness of Breath questionnaire improved by 6 points; Minimal Important Difference is 5 points.   Breathing Techniques   Goals Progress/Improvement seen  Yes   Comments Kelly Khalid uses PLB with activity and finds it very helpful.   Increase knowledge of respiratory medications   Goals Progress/Improvement seen  Yes   Comments Kelly Garramone has a good understanding of her MDI's and oxygen.      Nutrition & Weight - Outcomes:      Post Biometrics - 10/08/14 1225     Post  Biometrics   Height 5\' 7"  (1.702 m)   Weight 278 lb (126.1 kg)   Waist Circumference 46 inches   Hip Circumference 56 inches   Waist to Hip Ratio 0.82 %   BMI (Calculated) 43.6      Nutrition:   Nutrition Discharge:   Education Questionnaire Score:   Goals reviewed with patient; copy given to patient.

## 2014-10-17 ENCOUNTER — Encounter: Payer: 59 | Admitting: *Deleted

## 2014-10-17 DIAGNOSIS — G4730 Sleep apnea, unspecified: Secondary | ICD-10-CM

## 2014-10-17 DIAGNOSIS — J432 Centrilobular emphysema: Secondary | ICD-10-CM | POA: Diagnosis not present

## 2014-10-17 DIAGNOSIS — J449 Chronic obstructive pulmonary disease, unspecified: Secondary | ICD-10-CM

## 2014-10-17 NOTE — Progress Notes (Signed)
Daily Session Note  Patient Details  Name: Kelly Hale MRN: [id number] Date of Birth: [birth year] Referring Provider:  Harlan Stains, MD  Encounter Date: 10/17/2014  Check In:     Session Check In - 10/17/14 1223    Check-In   Staff Present Candiss Norse MS, ACSM CEP Exercise Physiologist;Stacey Blanch Media RRT, RCP Respiratory Therapist   ER physicians immediately available to respond to emergencies [facility] immediately available ER MD   Physician(s) Kerman Passey and Lord   Medication changes reported     No   Fall or balance concerns reported    No   Warm-up and Cool-down Performed on first and last piece of equipment   VAD Patient? No   Pain Assessment   Currently in Pain? No/denies   Multiple Pain Sites No         Goals Met:  Proper associated with RPD/PD & O2 Sat Independence with exercise equipment Using PLB without cueing & demonstrates good technique Exercise tolerated well Strength training completed today  Goals Unmet:  Not Applicable  Goals Comments:   Dr. Emily Filbert is Medical Director for [facility] and [facility].

## 2014-10-20 DIAGNOSIS — G4733 Obstructive sleep apnea (adult) (pediatric): Secondary | ICD-10-CM

## 2014-10-20 DIAGNOSIS — J432 Centrilobular emphysema: Secondary | ICD-10-CM | POA: Diagnosis not present

## 2014-10-20 NOTE — Progress Notes (Signed)
Pulmonary Individual Treatment Plan  Patient Details  Name: Kelly Hale MRN: [id number] Date of Birth: [birth year] Referring Provider:  Dr. Vilinda Boehringer  Initial Encounter Date: 06/24/2014  Visit Diagnosis: OSA (obstructive sleep apnea)  Patient's Home Medications on Admission:  Current outpatient prescriptions:    albuterol (PROVENTIL) (2.5 MG/3ML) 0.083% nebulizer solution, Take 3 mLs (2.5 mg total) by nebulization every 6 (six) hours as needed for wheezing or shortness of breath., Disp: 100 vial, Rfl: 11   amLODipine (NORVASC) 5 MG tablet, Take 1 tablet by mouth daily., Disp: , Rfl:    Fluticasone-Salmeterol (ADVAIR DISKUS) 500-50 MCG/DOSE AEPB, Inhale 1 puff into the lungs 2 (two) times daily., Disp: 60 each, Rfl: 3   furosemide (LASIX) 20 MG tablet, Take 1 tablet (20 mg total) by mouth daily. (Patient taking differently: Take 20 mg by mouth every other day. ), Disp: 90 tablet, Rfl: 3   Multiple Vitamin (MULTIVITAMIN) capsule, Take 1 capsule by mouth daily., Disp: , Rfl:    OXYGEN, Inhale 2 L into the lungs daily. 2 liters daily, Disp: , Rfl:    PROAIR HFA 108 (90 BASE) MCG/ACT inhaler, INHALE 2 PUFFS BY MOUTH 4 TIMES A DAY AS NEEDED, Disp: , Rfl: 0   Vitamin D, Cholecalciferol, 1000 UNITS CAPS, Take 1 capsule by mouth daily., Disp: , Rfl:   Past Medical History: Past Medical History  Diagnosis Date   Asthma    Hyperlipidemia    Aspirin allergy    Morbid obesity    Tobacco abuse    CHF (congestive heart failure)     Tobacco Use: History  Smoking status   Current Every Day Smoker -- 1.00 packs/day for 30 years   Types: Cigarettes  Smokeless tobacco   Never Used    Comment: quit in Sept 2015, but started back smoking up to 2 cigs per day    Labs: Recent Review Flowsheet Data    There is no flowsheet data to display.       ADL UCSD:     ADL UCSD      06/24/14 1130 06/24/14 1458 10/15/14 1130   ADL UCSD   ADL Phase Entry Mid    SOB Score  total 45 64 39   Rest 0 0 0   Walk $Rem'1 1 1   'PSVE$ Stairs $Re'3 5 4   'bfN$ Bath $R'3 3 1   'In$ Dress $'3 3 1   'd$ Shop '2 2 2       '$ Pulmonary Function Assessment:   Exercise Target Goals:    Exercise Program Goal: Individual exercise prescription set with THRR, safety & activity barriers. Participant demonstrates ability to understand and report RPE using BORG scale, to self-measure pulse accurately, and to acknowledge the importance of the exercise prescription.  Exercise Prescription Goal: Starting with aerobic activity 30 plus minutes a day, 3 days per week for initial exercise prescription. Provide home exercise prescription and guidelines that participant acknowledges understanding prior to discharge.  Activity Barriers & Risk Stratification:   6 Minute Walk:     6 Minute Walk      06/24/14 1215 08/22/14 1300 10/08/14 1224   6 Minute Walk   Phase Initial Mid Program Discharge   Distance 1160 feet 1015 feet 1248 feet   Walk Time 6 minutes 6 minutes 6 minutes   Resting HR 98 bpm 105 bpm 104 bpm   Resting BP 138/80 mmHg 152/88 mmHg 140/80 mmHg   Max Ex. HR 129 bpm 116 bpm 126 bpm   Max  Ex. BP 154/88 mmHg 156/88 mmHg 162/76 mmHg   RPE $Re'12 11 10   'EjP$ Perceived Dyspnea  3 3.5 3   Symptoms  No No      Initial Exercise Prescription:   Exercise Prescription Changes:     Exercise Prescription Changes      08/18/14 1200 09/01/14 1000 09/01/14 1300 09/05/14 1200 09/15/14 1200   Exercise Review   Progression  Yes Yes  Yes   Response to Exercise   Blood Pressure (Admit)  100/80 mmHg 100/80 mmHg     Blood Pressure (Exercise)  142/80 mmHg 142/80 mmHg     Blood Pressure (Exit)  134/80 mmHg 134/80 mmHg     Heart Rate (Admit)   109 bpm     Heart Rate (Exercise)   120 bpm     Heart Rate (Exit)   106 bpm     Oxygen Saturation (Admit)   93 %     Oxygen Saturation (Exercise)   90 %     Oxygen Saturation (Exit)   94 %     Rating of Perceived Exertion (Exercise)   12     Perceived Dyspnea (Exercise)   3      Intensity  THRR unchanged THRR unchanged     Progression  Continue progressive overload as per policy without signs/symptoms or physical distress. Continue progressive overload as per policy without signs/symptoms or physical distress.     Resistance Training   Training Prescription  Yes Yes     Weight  3 3     Reps  10-12 10-12     Treadmill   MPH  2.2 2.2  2.5   Grade  $Remo'1 1  1   'FmcpG$ Minutes  15 15     NuStep   Level  5 5     Watts  55 55     Minutes  15 15     Recumbant Elliptical   Level $Remo'3 3 3 4 'ADmue$ 4.5   RPM 45 45 45 50 50   Minutes  $Remove'15 15 15      'EqmAwvd$ 09/17/14 1200 09/24/14 1200 09/24/14 1500 10/15/14 1100 10/15/14 1500   Exercise Review   Progression  Yes Yes Yes Yes   Response to Exercise   Blood Pressure (Admit)   144/78 mmHg 144/78 mmHg 150/82 mmHg   Blood Pressure (Exercise)   170/88 mmHg 170/88 mmHg 164/80 mmHg   Blood Pressure (Exit)   114/68 mmHg 114/68 mmHg 122/76 mmHg   Heart Rate (Admit)   117 bpm 117 bpm 105 bpm   Heart Rate (Exercise)   122 bpm 122 bpm 117 bpm   Heart Rate (Exit)   104 bpm 104 bpm 95 bpm   Oxygen Saturation (Admit)   95 % 95 % 93 %  3l/m continuous   Oxygen Saturation (Exercise)   93 %  3l/m continous 93 %  3l/m continous 95 %   Oxygen Saturation (Exit)   93 % 93 % 97 %   Rating of Perceived Exertion (Exercise)   '15 15 14   '$ Perceived Dyspnea (Exercise)   '3 3 3   '$ Frequency     Add 2 additional days to program exercise sessions.   Intensity     Rest + 30   Progression     Continue progressive overload as per policy without signs/symptoms or physical distress.   Resistance Training   Training Prescription     Yes   Weight     3   Reps  10-12   Treadmill   MPH 2.5 2.5  2.5 2.5   Grade $Remo'1 1  1 1   'MfVfN$ Minutes $Rem'15 15  15 15   'cyDi$ NuStep   Level $Remo'6 6  6 6   'ZUfUE$ Watts 65 65  65 65   Minutes $Remove'15 15  15 15   'XHrXNLp$ Recumbant Elliptical   Level 4.$Remove'5 5  5 5   'DTfovZZ$ RPM 50 50  50 50   Minutes 15    15      Discharge Exercise Prescription (Final Exercise Prescription  Changes):     Exercise Prescription Changes - 10/15/14 1500    Exercise Review   Progression Yes   Response to Exercise   Blood Pressure (Admit) 150/82 mmHg   Blood Pressure (Exercise) 164/80 mmHg   Blood Pressure (Exit) 122/76 mmHg   Heart Rate (Admit) 105 bpm   Heart Rate (Exercise) 117 bpm   Heart Rate (Exit) 95 bpm   Oxygen Saturation (Admit) 93 %  3l/m continuous   Oxygen Saturation (Exercise) 95 %   Oxygen Saturation (Exit) 97 %   Rating of Perceived Exertion (Exercise) 14   Perceived Dyspnea (Exercise) 3   Frequency Add 2 additional days to program exercise sessions.   Intensity Rest + 30   Progression Continue progressive overload as per policy without signs/symptoms or physical distress.   Resistance Training   Training Prescription Yes   Weight 3   Reps 10-12   Treadmill   MPH 2.5   Grade 1   Minutes 15   NuStep   Level 6   Watts 65   Minutes 15   Recumbant Elliptical   Level 5   RPM 50   Minutes 15       Nutrition:  Target Goals: Understanding of nutrition guidelines, daily intake of sodium '1500mg'$ , cholesterol '200mg'$ , calories 30% from fat and 7% or less from saturated fats, daily to have 5 or more servings of fruits and vegetables.  Biometrics:      Post Biometrics - 10/08/14 1225     Post  Biometrics   Height $Remov'5\' 7"'QCzAty$  (1.702 m)   Weight 278 lb (126.1 kg)   Waist Circumference 46 inches   Hip Circumference 56 inches   Waist to Hip Ratio 0.82 %   BMI (Calculated) 43.6      Nutrition Therapy Plan and Nutrition Goals:   Nutrition Discharge: Rate Your Plate Scores:   Psychosocial: Target Goals: Acknowledge presence or absence of depression, maximize coping skills, provide positive support system. Participant is able to verbalize types and ability to use techniques and skills needed for reducing stress and depression.  Initial Review & Psychosocial Screening:   Quality of Life Scores:   PHQ-9:     Recent Review Flowsheet Data     There is no flowsheet data to display.      Psychosocial Evaluation and Intervention:     Psychosocial Evaluation - 10/08/14 1140    Discharge Psychosocial Assessment & Intervention   Comments Counselor met wtih Ms. Asbury today for follow up evaluation prior to discharge.  She reports she is sleeping better now that she got her mask fixed and that helps everything!  She has made a great deal of progress in this class with increased stamina and less shortness of breath.  She reports vacuuming and performing normal household duties without having to use her oxygen as much.  She also reports she has lost some weight since beginning this program.  Ms. Hale plans to  maintin her progress by joining one of the follow-up programs designed for consistency in exercise.         Psychosocial Re-Evaluation:  Education: Education Goals: Education classes will be provided on a weekly basis, covering required topics. Participant will state understanding/return demonstration of topics presented.  Learning Barriers/Preferences:   Education Topics: Initial Evaluation Education: - Verbal, written and demonstration of respiratory meds, RPE/PD scales, oximetry and breathing techniques. Instruction on use of nebulizers and MDIs: cleaning and proper use, rinsing mouth with steroid doses and importance of monitoring MDI activations.   General Nutrition Guidelines/Fats and Fiber: -Group instruction provided by verbal, written material, models and posters to present the general guidelines for heart healthy nutrition. Gives an explanation and review of dietary fats and fiber.          Most Recent Value   Date  10/06/14   Educator  Karolee Stamps, RD   Instruction Review Code  2- meets goals/outcomes      Controlling Sodium/Reading Food Labels: -Group verbal and written material supporting the discussion of sodium use in heart healthy nutrition. Review and explanation with models, verbal and written  materials for utilization of the food label.   Exercise Physiology & Risk Factors: - Group verbal and written instruction with models to review the exercise physiology of the cardiovascular system and associated critical values. Details cardiovascular disease risk factors and the goals associated with each risk factor.   Aerobic Exercise & Resistance Training: - Gives group verbal and written discussion on the health impact of inactivity. On the components of aerobic and resistive training programs and the benefits of this training and how to safely progress through these programs.      Most Recent Value   Date  10/15/14   Educator  SW   Instruction Review Code  2- meets goals/outcomes      Flexibility, Balance, General Exercise Guidelines: - Provides group verbal and written instruction on the benefits of flexibility and balance training programs. Provides general exercise guidelines with specific guidelines to those with heart or lung disease. Demonstration and skill practice provided.      Most Recent Value   Date  08/13/14   Educator  SW   Instruction Review Code  2- meets goals/outcomes      Stress Management: - Provides group verbal and written instruction about the health risks of elevated stress, cause of high stress, and healthy ways to reduce stress.      Most Recent Value   Date  09/10/14   Educator  [place]   Instruction Review Code  2- meets goals/outcomes      Depression: - Provides group verbal and written instruction on the correlation between heart/lung disease and depressed mood, treatment options, and the stigmas associated with seeking treatment.      Most Recent Value   Date  09/24/14   Educator  Berle Mull, MSW   Instruction Review Code  2- meets goals/outcomes      Exercise & Equipment Safety: - Individual verbal instruction and demonstration of equipment use and safety with use of the equipment.   Infection Prevention: - Provides  verbal and written material to individual with discussion of infection control including proper hand washing and proper equipment cleaning during exercise session.   Falls Prevention: - Provides verbal and written material to individual with discussion of falls prevention and safety.   Diabetes: - Individual verbal and written instruction to review signs/symptoms of diabetes, desired ranges of glucose level fasting, after  meals and with exercise. Advice that pre and post exercise glucose checks will be done for 3 sessions at entry of program.      Most Recent Value   Date  09/19/14   Educator  C. Enterkin,RN   Instruction Review Code  2- meets goals/outcomes      Chronic Lung Diseases: - Group verbal and written instruction to review new updates, new respiratory medications, new advancements in procedures and treatments. Provide informative websites and "800" numbers of self-education.      Most Recent Value   Date  08/18/14   Educator  L. Owens Shark   Instruction Review Code  2- meets goals/outcomes      Lung Procedures: - Group verbal and written instruction to describe testing methods done to diagnose lung disease. Review the outcome of test results. Describe the treatment choices: Pulmonary Function Tests, ABGs and oximetry.      Most Recent Value   Date  08/22/14   Educator  SJ   Instruction Review Code  2- meets goals/outcomes      Energy Conservation: - Provide group verbal and written instruction for methods to conserve energy, plan and organize activities. Instruct on pacing techniques, use of adaptive equipment and posture/positioning to relieve shortness of breath.   Triggers: - Group verbal and written instruction to review types of environmental controls: home humidity, furnaces, filters, dust mite/pet prevention, HEPA vacuums. To discuss weather changes, air quality and the benefits of nasal washing.      Most Recent Value   Date  10/20/14   Educator  LB    Instruction Review Code  2- meets goals/outcomes      Exacerbations: - Group verbal and written instruction to provide: warning signs, infection symptoms, calling MD promptly, preventive modes, and value of vaccinations. Review: effective airway clearance, coughing and/or vibration techniques. Create an Sports administrator.      Most Recent Value   Date  09/15/14   Educator  L. Owens Shark   Instruction Review Code  2- meets goals/outcomes      Oxygen: - Individual and group verbal and written instruction on oxygen therapy. Includes supplement oxygen, available portable oxygen systems, continuous and intermittent flow rates, oxygen safety, concentrators, and Medicare reimbursement for oxygen.   Respiratory Medications: - Group verbal and written instruction to review medications for lung disease. Drug class, frequency, complications, importance of spacers, rinsing mouth after steroid MDI's, and proper cleaning methods for nebulizers.   AED/CPR: - Group verbal and written instruction with the use of models to demonstrate the basic use of the AED with the basic ABC's of resuscitation.      Most Recent Value   Date  09/05/14   Educator  CE   Instruction Review Code  2- meets goals/outcomes      Breathing Retraining: - Provides individuals verbal and written instruction on purpose, frequency, and proper technique of diaphragmatic breathing and pursed-lipped breathing. Applies individual practice skills.   Anatomy and Physiology of the Lungs: - Group verbal and written instruction with the use of models to provide basic lung anatomy and physiology related to function, structure and complications of lung disease.      Most Recent Value   Date  10/03/14   Educator  SJ   Instruction Review Code  2- meets goals/outcomes      Heart Failure: - Group verbal and written instruction on the basics of heart failure: signs/symptoms, treatments, explanation of ejection fraction, enlarged heart and  cardiomyopathy.   Sleep Apnea: -  Individual verbal and written instruction to review Obstructive Sleep Apnea. Review of risk factors, methods for diagnosing and types of masks and machines for OSA.   Anxiety: - Provides group, verbal and written instruction on the correlation between heart/lung disease and anxiety, treatment options, and management of anxiety.   Relaxation: - Provides group, verbal and written instruction about the benefits of relaxation for patients with heart/lung disease. Also provides patients with examples of relaxation techniques.      Most Recent Value   Date  08/27/14   Educator  Jeannetta Ellis LCSW   Instruction Review Code  2- Meets goals/outcomes      Knowledge Questionnaire Score:   Personal Goals and Risk Factors at Admission:     Personal Goals and Risk Factors at Admission - 06/24/14 1130    Personal Goals and Risk Factors on Admission    Weight Management Yes   Intervention Learn and follow the exercise and diet guidelines while in the program. Utilize the nutrition and education classes to help gain knowledge of the diet and exercise expectations in the program   Admit Weight 275 lb (124.739 kg)   Goal Weight 160 lb (72.576 kg)   Increase Aerobic Exercise and Physical Activity Yes   Intervention While in program, learn and follow the exercise prescription taught. Start at a low level workload and increase workload after able to maintain previous level for 30 minutes. Increase time before increasing intensity.   Quit Smoking Yes   Intervention Utilize your health care professional team to help with smoking cessation while in the program. Your doctor can prescribe medications to aid in cessation. The program can provide information and counseling as needed.   Understand more about Heart/Pulmonary Disease. Yes   Intervention While in program utilize professionals for any questions, and attend the education sessions. Great websites to use are  www.americanheart.org or www.lung.org for reliable information.   Improve shortness of breath with ADL's Yes   Intervention While in program, learn and follow the exercise prescription taught. Start at a low level workload and increase workload ad advised by the exercise physiologist. Increase time before increasing intensity.   Develop more efficient breathing techniques such as purse lipped breathing and diaphragmatic breathing; and practicing self-pacing with activity Yes   Intervention While in program, learn and utilize the specific breathing techniques taught to you. Continue to practice and use the techniques as needed.   Increase knowledge of respiratory medications and ability to use respiratory devices properly.  Yes   Intervention While in program learn and demonstrate appropriate use of your oxygen therapy by increasing flow with exertion, manage oxygen tank operation, including continuous and intermittent flow.  Understanding oxygen is a drug ordered by your physician.;While in program, learn to administer MDI, nebulizer, and spacer properly.;Learn to take respiratory medicine as ordered.;While in program, learn to Clean MDI, nebulizers, and spacers properly.      Personal Goals and Risk Factors Review:      Goals and Risk Factor Review      08/27/14 1130 09/12/14 1620 09/15/14 1213 09/15/14 1440 09/17/14 1130   Increase Aerobic Exercise and Physical Activity   Goals Progress/Improvement seen    Yes     Comments   Discussed [employer] and the [employer]. Reece Levy may join it or may purchase a treadmill for use at home. Given the scholarship info for [employer].      Quit Smoking   Goals Progress/Improvement seen  No No No No  Comments 2-3cig/day; she smokes randomly during the day to help with cessation of smoking. still smoking 2-3cig/day Still smoking 2-3 cigarettes/day Continues smoking 2-3cig/day Ms Howson is still smoking 2-3 cig/d and has no quit date set.     09/19/14 1237  09/24/14 1257 09/24/14 1459 09/26/14 1455 10/01/14 1130   Weight Management   Goals Progress/Improvement seen  No      Comments  Deb is frustrated with little progress on losing weight. She has only lost a few pounds in the last month and a half and is discouraged. We discussed caloric balance and how weight loss works and the caloric deficit exercise creates, but that nutrition is more powerful in creating a caloric deficit. We spent a lot of time discusing how many calories are expened during her exercise and high calorie foods to avoid.       Increase Aerobic Exercise and Physical Activity   Goals Progress/Improvement seen  Yes Yes      Comments  Deb is consistently increasing her workloads on all the equipment and works very hard during exercise.       Quit Smoking   Goals Progress/Improvement seen No  No No Yes   Comments Still is smoking 2-3 cigarettes/day  Smoking 2-3cig/day; gave her the smoking cessation literature from [place] still smoking 2-3 cig/day Ms Donaghy had a friend visit her who had quit smoking with Chantix. Ms Skousen is will to try Chantix to quit smoking completely.   Understand more about Heart/Pulmonary Disease   Goals Progress/Improvement seen   Yes      Comments  Suzi Roots has enjoyed the educational lectures and feels more comfortable managing her disease.       Improve shortness of breath with ADL's   Goals Progress/Improvement seen   Yes      Breathing Techniques   Goals Progress/Improvement seen     Yes    Comments    Ms. Otterson demonstrates proper technique for PLB and uses it when apporate,    Increase knowledge of respiratory medications   Goals Progress/Improvement seen     Yes    Comments    Went over proper technique for use of MDI and spacer and to rinse mouth out after taking Advair.    Other Goal   Goals Progress/Improvement seen     Yes    Comments    Oxygen.  Kamrie seems to be very confident in setting up and adjusting her oxygen with use for  exercise.  She states that she has no question about O2.      10/08/14 1130           Increase Aerobic Exercise and Physical Activity   Goals Progress/Improvement seen  Yes       Comments Ms Rane improved her 23mwd by 18ft with her post walk.          Personal Goals Discharge:      Personal Goals at Discharge - 10/15/14 1130    Weight Management   Goals Progress/Improvement seen No   Comments Ms Wah has maintained an average weight between 278-280.    Increase Aerobic Exercise and Physical Activity   Goals Progress/Improvement seen  Yes   Comments Ms Lieber has increased her exercise goals on the TM, REL, and T5; she has increased her weights from 1 to 3lbs; Ms Prim has also increased stamina and endurance with home activites.   Quit Smoking   Goals Progress/Improvement seen No   Comments She  is still smoking 2-3 cig/day, although she wants to quit, but no quit date.   Improve shortness of breath with ADL's   Goals Progress/Improvement seen  Yes   Comments Ms Berling's UCSD Shortness of Breath questionnaire improved by 6 points; Minimal Important Difference is 5 points.   Breathing Techniques   Goals Progress/Improvement seen  Yes   Comments Ms Sinor uses PLB with activity and finds it very helpful.   Increase knowledge of respiratory medications   Goals Progress/Improvement seen  Yes   Comments Ms Swickard has a good understanding of her MDI's and oxygen.      Comments: Ms Krogstad plans to join [employer] 2-3d/wk. Thank you for the opportunity to work with your patient, Ms Dalaney Needle.

## 2014-10-20 NOTE — Progress Notes (Signed)
Daily Session Note  Patient Details  Name: Kelly Hale MRN: [id number] Date of Birth: 1955-06-29 Referring Provider:  Harlan Stains, MD  Encounter Date: 10/20/2014  Check In:     Session Check In - 10/20/14 1216    Check-In   Staff Present Lestine Box BS, ACSM EP-C, Exercise Physiologist;Laureen Janell Quiet, RRT, Respiratory Therapist   ER physicians immediately available to respond to emergencies [facility] immediately available ER MD   Physician(s) goodman and williams   Medication changes reported     No   Fall or balance concerns reported    No   Warm-up and Cool-down Performed on first and last piece of equipment   VAD Patient? No   Pain Assessment   Currently in Pain? No/denies         Goals Met:  Proper associated with RPD/PD & O2 Sat Exercise tolerated well No report of cardiac concerns or symptoms Strength training completed today  Goals Unmet:  Not Applicable  Goals Comments:    Dr. Emily Filbert is Medical Director for [place] and [facility].

## 2014-11-11 ENCOUNTER — Ambulatory Visit: Payer: 59 | Attending: Family Medicine

## 2014-11-11 DIAGNOSIS — M25512 Pain in left shoulder: Secondary | ICD-10-CM | POA: Insufficient documentation

## 2014-11-11 DIAGNOSIS — M7582 Other shoulder lesions, left shoulder: Secondary | ICD-10-CM | POA: Diagnosis not present

## 2014-11-11 DIAGNOSIS — R531 Weakness: Secondary | ICD-10-CM | POA: Diagnosis present

## 2014-11-11 DIAGNOSIS — M25612 Stiffness of left shoulder, not elsewhere classified: Secondary | ICD-10-CM

## 2014-11-12 NOTE — Therapy (Signed)
[place] MAIN [facility] SERVICES [address] [place], [place], [address] Phone: [phone]   Fax:  [phone]  Physical Therapy Evaluation  Patient Details  Name: Kelly Hale MRN: [id number] Date of Birth: 1955/06/14 Referring Provider:  Harlan Stains, MD  Encounter Date: 11/11/2014      PT End of Session - 11/12/14 1216    Visit Number 1   Number of Visits 9   Date for PT Re-Evaluation 12/09/14   PT Start Time 5885   PT Stop Time 1114   PT Time Calculation (min) 59 min   Activity Tolerance Patient tolerated treatment well   Behavior During Therapy [facility] for tasks assessed/performed      Past Medical History  Diagnosis Date  . Asthma   . Hyperlipidemia   . Aspirin allergy   . Morbid obesity   . Tobacco abuse   . CHF (congestive heart failure)     Past Surgical History  Procedure Laterality Date  . Cesarean section    . Tubal ligation    . Knee arthroscopy      right    There were no vitals filed for this visit.  Visit Diagnosis:  Decreased ROM of left shoulder  Pain in joint, shoulder region, left  Weakness      Subjective Assessment - 11/12/14 0939    Subjective pt relates she has been having left shoulder pain since ~ February of this year.  pt reports she woke up one morthing with left arm above shoulder level and heard a pop but did not experience immediately until later that day.  Since then she has had rigth anteior shoulder pain that has improved but still has pain and limited shoulder motion.  pt repots she has occionsal numbness and tingling in her left hand when she leans on her left arm during sitting and resolves with pressure off.  pt  denies any neck pain.  pt reports exercises performed in pulmonary rehab helped with the shoudler pain.  pt reports her MD says it's a sore muscle.  pt reports current pain is a 6 with the worst pain being an 8 with movment of left arm.     Currently in Pain? Yes   Pain  Score 6    Pain Location Shoulder   Pain Orientation Left   Pain Descriptors / Indicators Aching   Pain Onset More than a month ago            [facility] PT Assessment - 11/12/14 0001    Assessment   Medical Diagnosis left shoulder pain   Onset Date/Surgical Date 05/17/14   Hand Dominance Right   Next MD Visit n/a   Prior Therapy pulmonary rehab   Precautions   Precautions None   Restrictions   Weight Bearing Restrictions No   Balance Screen   Has the patient fallen in the past 6 months Yes   How many times? 1   Has the patient had a decrease in activity level because of a fear of falling?  Yes   Is the patient reluctant to leave their home because of a fear of falling?  No   Home Social worker Private residence   Living Arrangements Spouse/significant other   Available Help at Discharge Family   Type of [place] to enter   Entrance Stairs-Number of Steps 2   Entrance Stairs-Rails Right   Home Layout Two level   Alternate Level  Stairs-Number of Steps 13   Alternate Level Stairs-Rails Right   Home Equipment None   Additional Comments oxygen at 2l, and CPAP   Prior Function   Level of Independence Independent   Vocation Retired   Leisure PAIN OILS   Cognition   Overall Cognitive Status Within Functional Limits for tasks assessed   Sensation   Light Touch Appears Intact   Coordination   Gross Motor Movements are Fluid and Coordinated Yes   ROM / Strength   AROM / PROM / Strength AROM   AROM   Overall AROM  Deficits   AROM Assessment Site Shoulder   Right/Left Shoulder Left   Left Shoulder Flexion 82 Degrees   Left Shoulder ABduction 85 Degrees   Left Shoulder External Rotation 45 Degrees      UE dermatomes: WNL Biceps tendon (C6) reflex absent on left  Left wrist flexion/extension MMT: 4+/5 Left elbow flexion MMT: 4+/5 with minimal pain in shoulder Left shoulder abduction/flexion: deferred due to pain Left shoulder  external/internal rotation: 4/5 and with pain with external rotation in left shoulder  Left shoulder PROM (deg) Flexion: >110 Abduction: >100 With guarding at end range  Cervical ROM: Flexion/extension/rotation: WNL Left cervical rotation increased left upper trap pain Repeated cervical extension x10 did not increase pain in shoulder  Sperling's: (-)   Therex: Scapular retractions 2x10 Cervical retractions x10 Active shoulder flexion with scapular retractions x5 Active shoulder flexion while SPT performed manual cervical traction in sitting x5 Pt experienced improved shoulder motion with scapular retractions and cervical traction                     PT Education - 11/12/14 1215    Education provided Yes   Education Details plan of care, possible cervical spine involment with shoulder pain, HEP (cervical retractions and scapular retractoins)   Person(s) Educated Patient   Methods Explanation;Demonstration   Comprehension Verbalized understanding;Returned demonstration             PT Long Term Goals - 11/12/14 1226    PT LONG TERM GOAL #1   Title pt's active shoulder flexion will improve to at least 120 degrees with no pain in order to do her hair.    Baseline 82 degrees with pain    Time 4   Period Weeks   Status New   PT LONG TERM GOAL #2   Title pt improve shoulder strength to 4+/5 MMT in order to place dishes on top cabinets   Baseline deferred due to pain   Time 4   Period Weeks   Status New   PT LONG TERM GOAL #3   Title pt's  quick dash will improve by at least 10 points in order to decresae disability/improve quality of life   Baseline 56.81% disability   Time 4   Period Weeks   Status New   PT LONG TERM GOAL #4   Title pt's grip strength difference between left and right will be less than 15# in order to improve functional use of hand such as opening jar   Baseline rigth: 90 #and Left 65 #   Time 4   Period Weeks   Status New                Plan - 11/12/14 1217    Clinical Impression Statement pt is a 59 year old female with chronic shoulder pain ~ 5 months and presents with decreased shoulder ROM, numbness tingling in her hand, difference  in grip strength with R>L.  [name] able to increase active shoulder flexion while manual cervical traction also while  performing cervical retatractions suggesting a cervical component to her pain.  pt also has >10% reduced grip strength on the L hand vs the R. pt does have pain with palpation to biceps tendon/ greater turbicle and trigger point in upper left trap.  pt will benefit from skilled PT servies to improve shoulder ROM, decresae pain, improve strength, improve left UE functional use.    Pt will benefit from skilled therapeutic intervention in order to improve on the following deficits Decreased range of motion;Impaired UE functional use;Postural dysfunction;Decreased strength;Hypomobility;Pain   Rehab Potential Good   PT Frequency 2x / week   PT Duration 4 weeks   PT Treatment/Interventions ADLs/Self Care Home Management;Aquatic Therapy;Biofeedback;Cryotherapy;Electrical Stimulation;Iontophoresis 4mg /ml Dexamethasone;Moist Heat;Therapeutic exercise;Therapeutic activities;Functional mobility training;Traction;Neuromuscular re-education;Patient/family education;Manual techniques;Passive range of motion   PT Next Visit Plan special shoulder test and cervical spine         Problem List Patient Active Problem List   Diagnosis Date Noted  . OSA on CPAP 05/06/2014  . Morbid obesity 02/12/2014  . CHF with left ventricular diastolic dysfunction, NYHA class 2 02/10/2014  . Otitis, externa, infective 02/10/2014  . Neuropathic pain, arm 06/19/2011  . Asthma 05/09/2010  . TOBACCO USER 05/06/2010  . COPD with emphysema 05/06/2010   Renford Dills This entire session was performed under direct supervision and direction of a licensed Chiropractor . I have  personally read, edited and approve of the note as written.  Gorden Harms. [name], PT, DPT [phone]  [name],[name] 11/12/2014, 2:16 PM  [place] MAIN [facility] SERVICES [address] [place], [place], [address] Phone: [phone]   Fax:  [phone]

## 2014-11-25 ENCOUNTER — Ambulatory Visit: Payer: 59

## 2014-11-25 DIAGNOSIS — R531 Weakness: Secondary | ICD-10-CM

## 2014-11-25 DIAGNOSIS — M25612 Stiffness of left shoulder, not elsewhere classified: Secondary | ICD-10-CM

## 2014-11-25 DIAGNOSIS — M7582 Other shoulder lesions, left shoulder: Secondary | ICD-10-CM | POA: Diagnosis not present

## 2014-11-25 DIAGNOSIS — M25512 Pain in left shoulder: Secondary | ICD-10-CM

## 2014-11-25 NOTE — Patient Instructions (Signed)
[email or web address] Left shoulder external rotation with 1# in sidelying 2x10 Left shoulder internal rotation with red band 2x10 Bilateral shoulder extension with red band 2x10 Shoulder rows with red band x10

## 2014-11-25 NOTE — Therapy (Signed)
[place] MA[place] [facility] SERVICES [address] [place], [place], [address] Phone: [phone]   Fax:  [phone]  Physical Therapy Treatment  Patient Details  Name: Kelly Hale MRN: [id number] Date of Birth: December 19, 1955 Referring Provider:  Harlan Stains, MD  Encounter Date: 11/25/2014      PT End of Session - 11/25/14 1711    Visit Number 2   Number of Visits 9   Date for PT Re-Evaluation 12/09/14   PT Start Time 1115   PT Stop Time 1200   PT Time Calculation (min) 45 min   Activity Tolerance Patient tolerated treatment well   Behavior During Therapy [facility] for tasks assessed/performed      Past Medical History  Diagnosis Date  . Asthma   . Hyperlipidemia   . Aspirin allergy   . Morbid obesity   . Tobacco abuse   . CHF (congestive heart failure)     Past Surgical History  Procedure Laterality Date  . Cesarean section    . Tubal ligation    . Knee arthroscopy      right    There were no vitals filed for this visit.  Visit Diagnosis:  Decreased ROM of left shoulder  Pain in joint, shoulder region, left  Weakness      Subjective Assessment - 11/25/14 1116    Subjective pt has been exercising at [employer] and attends Tuesdays and Thursdays.  pt reports left shoulder is the same and has not noticed a difference with her HEP.  pt denies any pain currently and reports pain with shoulder flexion or extension with internal rotatoin.    Patient Stated Goals reduce pain   Currently in Pain? No/denies   Pain Score 0-No pain        Therex: sidelying left shoulder external rotation with 1# 2x10 Left Shoulder internal rotation with red band 2x10 Bilateral Shoulder extension with red band 2x10 Bilateral shoulder rows with red band 2x10 Pt required verbal cueing for correct technique and min tactile cueing to prevent shoulder shrugging  Yergason's test: negative Hawkin's: positive Neer's: positive ERLS:  negative Palpation: pt experienced pain in biceps tendon and supraspinatus  Left shoulder AROM in sitting  Flexoin: 95 degrees Abduction: 74 degrees     Iontophoresis : Stat patch with 4mg /ml dexamethasone on anterior aspect of shoulder near supraspinatus tendon to decrease inflammation. Skin prep and educated pt on the positive effects of iontophoresis and potential side effects and when to remove the patch.                           PT Education - 11/25/14 1711    Education provided Yes   Education Details plan of care, HEP   Person(s) Educated Patient   Methods Explanation;Demonstration   Comprehension Verbalized understanding;Returned demonstration             PT Long Term Goals - 11/12/14 1226    PT LONG TERM GOAL #1   Title pt's active shoulder flexion will improve to at least 120 degrees with no pain in order to do her hair.    Baseline 82 degrees with pain    Time 4   Period Weeks   Status New   PT LONG TERM GOAL #2   Title pt improve shoulder strength to 4+/5 MMT in order to place dishes on top cabinets   Baseline deferred due to pain   Time 4   Period  Weeks   Status New   PT LONG TERM GOAL #3   Title pt's  quick dash will improve by at least 10 points in order to decresae disability/improve quality of life   Baseline 56.81% disability   Time 4   Period Weeks   Status New   PT LONG TERM GOAL #4   Title pt's grip strength difference between left and right will be less than 15# in order to improve functional use of hand such as opening jar   Baseline rigth: 90 #and Left 65 #   Time 4   Period Weeks   Status New               Plan - 11/25/14 1712    Clinical Impression Statement pt presents with left signs and symptoms consistent with L shoulder impingement and irritated biceps tendon and supraspinatus. pt had little change in shoulder symptoms s/p 2 weeks of cervical exercises.  pt tolerated progression of rotator cuff  strengthening with minimal increase in pain.  PT applied inotophoresis stat patch to anterior left shoulder over supraspinatus tendon to decrease pain, inflammation.     Pt will benefit from skilled therapeutic intervention in order to improve on the following deficits Decreased range of motion;Impaired UE functional use;Postural dysfunction;Decreased strength;Hypomobility;Pain   Rehab Potential Good   PT Frequency 2x / week   PT Duration 4 weeks   PT Treatment/Interventions ADLs/Self Care Home Management;Aquatic Therapy;Biofeedback;Cryotherapy;Electrical Stimulation;Iontophoresis 4mg /ml Dexamethasone;Moist Heat;Therapeutic exercise;Therapeutic activities;Functional mobility training;Traction;Neuromuscular re-education;Patient/family education;Manual techniques;Passive range of motion   PT Next Visit Plan rotator cuff strengthening, passive ROM        Problem List Patient Active Problem List   Diagnosis Date Noted  . OSA on CPAP 05/06/2014  . Morbid obesity 02/12/2014  . CHF with left ventricular diastolic dysfunction, NYHA class 2 02/10/2014  . Otitis, externa, infective 02/10/2014  . Neuropathic pain, arm 06/19/2011  . Asthma 05/09/2010  . TOBACCO USER 05/06/2010  . COPD with emphysema 05/06/2010   Renford Dills, SPT This entire session was performed under direct supervision and direction of a licensed therapist/therapist assistant . I have personally read, edited and approve of the note as written. Gorden Harms. [name], PT, DPT [phone]  [name],[name] 11/26/2014, 9:55 AM  [place] MAIN [facility] SERVICES [address] [place], [place], [address] Phone: [phone]   Fax:  [phone]

## 2014-11-27 ENCOUNTER — Ambulatory Visit: Payer: 59

## 2014-11-27 DIAGNOSIS — R531 Weakness: Secondary | ICD-10-CM

## 2014-11-27 DIAGNOSIS — M25512 Pain in left shoulder: Secondary | ICD-10-CM

## 2014-11-27 DIAGNOSIS — M25612 Stiffness of left shoulder, not elsewhere classified: Secondary | ICD-10-CM

## 2014-11-27 DIAGNOSIS — M7582 Other shoulder lesions, left shoulder: Secondary | ICD-10-CM | POA: Diagnosis not present

## 2014-11-27 NOTE — Therapy (Signed)
[place] MAIN [facility] SERVICES [address] [place], [place], [address] Phone: [phone]   Fax:  [phone]  Physical Therapy Treatment  Patient Details  Name: Kelly Hale MRN: [id number] Date of Birth: 03-24-1956 Referring Provider:  Harlan Stains, MD  Encounter Date: 11/27/2014      PT End of Session - 11/27/14 1212    Visit Number 3   Number of Visits 9   Date for PT Re-Evaluation 12/09/14   PT Start Time 1030   PT Stop Time 1115   PT Time Calculation (min) 45 min   Activity Tolerance Patient tolerated treatment well   Behavior During Therapy [facility] for tasks assessed/performed      Past Medical History  Diagnosis Date  . Asthma   . Hyperlipidemia   . Aspirin allergy   . Morbid obesity   . Tobacco abuse   . CHF (congestive heart failure)     Past Surgical History  Procedure Laterality Date  . Cesarean section    . Tubal ligation    . Knee arthroscopy      right    There were no vitals filed for this visit.  Visit Diagnosis:  Decreased ROM of left shoulder  Pain in joint, shoulder region, left  Weakness      Subjective Assessment - 11/27/14 1031    Subjective pt relates she was sore all day yesterday and it is still sore today.  pt relates she started having pain after she took off the  stat patch.  pt relates current pain of 5/10 from anterior shoulder down to mid humerous.     Patient Stated Goals reduce pain   Pain Score 5    Pain Location Arm   Pain Orientation Left       Manual therapy: Soft tissue mobilization to left biceps to degrease myofacial trigger points Cross friction massage to biceps tendon and supraspinatus tendon 4x1 min MET to left pec to decrease tightness Assess glenohumeral joint and pt noted decreased pain with joint mobs A/P mob to left shoulder in ~80 degrees of scaption grade 2-3 3 x20 sec Left shoulder scaption PROM x10     Iontophoresis : Stat patch with $RemoveBe'4mg'tiqlijrkq$ /ml  dexamethasone on anterior aspect of shoulder over supraspinatus tendon to decrease inflammation. Skin prep and educated pt on the positive effects of iontophoresis and potential side effects and when to remove the patch.                             PT Education - 11/27/14 1212    Education provided Yes   Education Details cross friction massage, self biceps massage to decrease tightness/soreness and promote healing   Person(s) Educated Patient   Methods Explanation;Demonstration   Comprehension Verbalized understanding;Returned demonstration             PT Long Term Goals - 11/12/14 1226    PT LONG TERM GOAL #1   Title pt's active shoulder flexion will improve to at least 120 degrees with no pain in order to do her hair.    Baseline 82 degrees with pain    Time 4   Period Weeks   Status New   PT LONG TERM GOAL #2   Title pt improve shoulder strength to 4+/5 MMT in order to place dishes on top cabinets   Baseline deferred due to pain   Time 4   Period Weeks   Status New  PT LONG TERM GOAL #3   Title pt's  quick dash will improve by at least 10 points in order to decresae disability/improve quality of life   Baseline 56.81% disability   Time 4   Period Weeks   Status New   PT LONG TERM GOAL #4   Title pt's grip strength difference between left and right will be less than 15# in order to improve functional use of hand such as opening jar   Baseline rigth: 90 #and Left 65 #   Time 4   Period Weeks   Status New               Plan - 11/27/14 1214    Clinical Impression Statement pt presents with left biceps myofascial trigger points, hypomobile glenohumeral joint and tight left pec.  pt noted improved mobility and less pain following manual therapy.  SPT applied inotophoresis stat patch to anterior left shoulder over supraspinatus tendon to decrease pain, inflammation   Pt will benefit from skilled therapeutic intervention in order to improve  on the following deficits Decreased range of motion;Impaired UE functional use;Postural dysfunction;Decreased strength;Hypomobility;Pain   Rehab Potential Good   PT Frequency 2x / week   PT Duration 4 weeks   PT Treatment/Interventions ADLs/Self Care Home Management;Aquatic Therapy;Biofeedback;Cryotherapy;Electrical Stimulation;Iontophoresis 4mg /ml Dexamethasone;Moist Heat;Therapeutic exercise;Therapeutic activities;Functional mobility training;Traction;Neuromuscular re-education;Patient/family education;Manual techniques;Passive range of motion   PT Next Visit Plan manual therapy         Problem List Patient Active Problem List   Diagnosis Date Noted  . OSA on CPAP 05/06/2014  . Morbid obesity 02/12/2014  . CHF with left ventricular diastolic dysfunction, NYHA class 2 02/10/2014  . Otitis, externa, infective 02/10/2014  . Neuropathic pain, arm 06/19/2011  . Asthma 05/09/2010  . TOBACCO USER 05/06/2010  . COPD with emphysema 05/06/2010   Renford Dills This entire session was performed under direct supervision and direction of a licensed Chiropractor . I have personally read, edited and approve of the note as written. Gorden Harms. [name], PT, DPT [phone]  [name],[name] 11/27/2014, 4:12 PM  [place] [facility] MAIN [facility] SERVICES [address] [place], [place], [address] Phone: [phone]   Fax:  [phone]

## 2014-12-02 ENCOUNTER — Other Ambulatory Visit: Payer: Self-pay | Admitting: *Deleted

## 2014-12-02 ENCOUNTER — Ambulatory Visit: Payer: 59

## 2014-12-02 DIAGNOSIS — M25612 Stiffness of left shoulder, not elsewhere classified: Secondary | ICD-10-CM

## 2014-12-02 DIAGNOSIS — R531 Weakness: Secondary | ICD-10-CM

## 2014-12-02 DIAGNOSIS — M7582 Other shoulder lesions, left shoulder: Secondary | ICD-10-CM | POA: Diagnosis not present

## 2014-12-02 DIAGNOSIS — M25512 Pain in left shoulder: Secondary | ICD-10-CM

## 2014-12-02 MED ORDER — FLUTICASONE-SALMETEROL 500-50 MCG/DOSE IN AEPB: 1.0000 | INHALATION_SPRAY | Freq: Two times a day (BID) | RESPIRATORY_TRACT | Status: DC

## 2014-12-02 NOTE — Patient Instructions (Signed)
[email or web address] Shoulder wand exercises (flexion and abduction) 2x10 Low left shoulder isometric row 2x10 Bilateral trap stretch 3x30 sec

## 2014-12-02 NOTE — Therapy (Signed)
[place] MAIN [facility] SERVICES [address] [place], [place], [address] Phone: [phone]   Fax:  [phone]  Physical Therapy Treatment  Patient Details  Name: Kelly Hale MRN: [id number] Date of Birth: [birth year] Referring Provider:  Harlan Stains, MD  Encounter Date: 12/02/2014      PT End of Session - 12/02/14 1213    Visit Number 4   Number of Visits 9   Date for PT Re-Evaluation 12/09/14   PT Start Time 1117   PT Stop Time 1205   PT Time Calculation (min) 48 min   Activity Tolerance Patient tolerated treatment well   Behavior During Therapy [facility] for tasks assessed/performed      Past Medical History  Diagnosis Date  . Asthma   . Hyperlipidemia   . Aspirin allergy   . Morbid obesity   . Tobacco abuse   . CHF (congestive heart failure)     Past Surgical History  Procedure Laterality Date  . Cesarean section    . Tubal ligation    . Knee arthroscopy      right    There were no vitals filed for this visit.  Visit Diagnosis:  Decreased ROM of left shoulder  Pain in joint, shoulder region, left  Weakness      Subjective Assessment - 12/02/14 1114    Subjective pt reports she was not as sore after her last session and reports she has been complient with HEP.  pt relates she was able to work on her kitchen this weekend without any left shoulder problems.  Pt denies any current pain.  pt reports her husband massaged her upper traps to help with neck stiffness.   Patient Stated Goals reduce pain   Currently in Pain? No/denies   Pain Score 0-No pain        100 85   Manual therapy: Left shoulder flexion, abduction and external rotation  PROM x10 Decreased tension and pain in biceps and decreased pain upon palpation to supraspinatus tendon. Pt presents with myofacial trigger point in left upper trap  There ex: Active assisted with Ranger flexion and scaption 2x10 in standing, pt required verbal and tactile  cues to decrease shoulder shrug  Isometric low row x10 with 3 sec hold.   Bilateral upper trap stretch in sitting 2x30 sec each Scapular retractions x8, pt required tactile cues for correct technique Shoulder wand exercises (flexion and abduction in scaption) x10, pt required tactile and visual cues for correct technique Horizontal abduction and scaptoin in standing with torso at ~40 degree angle x5 pt has difficulty preventing excessive shoulder shrug  Pt noted decreased pain with repetitions.    Iontophoresis : Stat patch with 4mg /ml dexamethasone on anterior aspect of shoulder over supraspinatus tendon to decrease inflammation. Skin prep and educated pt on the positive effects of iontophoresis and potential side effects and when to remove the patch.   left shoulder active ROM pre treatment: Flexion 110 abdudction 85                       PT Education - 12/02/14 1211    Education provided Yes   Education Details HEP progression, using a mirror for visual cues to decrease left shoulder shrug during exercises, to continue moving left shoulder to improve ROM and decrease pain    Person(s) Educated Patient   Methods Explanation;Demonstration   Comprehension Verbalized understanding;Returned demonstration  PT Long Term Goals - 11/12/14 1226    PT LONG TERM GOAL #1   Title pt's active shoulder flexion will improve to at least 120 degrees with no pain in order to do her hair.    Baseline 82 degrees with pain    Time 4   Period Weeks   Status New   PT LONG TERM GOAL #2   Title pt improve shoulder strength to 4+/5 MMT in order to place dishes on top cabinets   Baseline deferred due to pain   Time 4   Period Weeks   Status New   PT LONG TERM GOAL #3   Title pt's  quick dash will improve by at least 10 points in order to decresae disability/improve quality of life   Baseline 56.81% disability   Time 4   Period Weeks   Status New   PT LONG TERM  GOAL #4   Title pt's grip strength difference between left and right will be less than 15# in order to improve functional use of hand such as opening jar   Baseline rigth: 90 #and Left 65 #   Time 4   Period Weeks   Status New               Plan - 12/02/14 1214    Clinical Impression Statement pt presents with decreased biceps myofacial trigger points and decrease pain in anterior shoulder with palpation.  pt does present with myofacial trigger point in left upper trap that improved with trap stretch.  pt presents with scapulohumeral rhythm dysfunction due by early scapular rotation.     Pt will benefit from skilled therapeutic intervention in order to improve on the following deficits Decreased range of motion;Impaired UE functional use;Postural dysfunction;Decreased strength;Hypomobility;Pain   Rehab Potential Good   PT Frequency 2x / week   PT Duration 4 weeks   PT Treatment/Interventions ADLs/Self Care Home Management;Aquatic Therapy;Biofeedback;Cryotherapy;Electrical Stimulation;Iontophoresis 4mg /ml Dexamethasone;Moist Heat;Therapeutic exercise;Therapeutic activities;Functional mobility training;Traction;Neuromuscular re-education;Patient/family education;Manual techniques;Passive range of motion   PT Next Visit Plan progress HEP        Problem List Patient Active Problem List   Diagnosis Date Noted  . OSA on CPAP 05/06/2014  . Morbid obesity 02/12/2014  . CHF with left ventricular diastolic dysfunction, NYHA class 2 02/10/2014  . Otitis, externa, infective 02/10/2014  . Neuropathic pain, arm 06/19/2011  . Asthma 05/09/2010  . TOBACCO USER 05/06/2010  . COPD with emphysema 05/06/2010   Renford Dills, SPT  This entire session was performed under direct supervision and direction of a licensed therapist/therapist assistant . I have personally read, edited and approve of the note as written. Gorden Harms. [name], PT, DPT [phone]  [name],[name] 12/02/2014, 2:43 PM  [place] MAIN [facility] SERVICES [address] [place], [place], [address] Phone: [phone]   Fax:  [phone]

## 2014-12-04 ENCOUNTER — Ambulatory Visit: Payer: 59

## 2014-12-04 DIAGNOSIS — M7582 Other shoulder lesions, left shoulder: Secondary | ICD-10-CM | POA: Diagnosis not present

## 2014-12-04 DIAGNOSIS — M25612 Stiffness of left shoulder, not elsewhere classified: Secondary | ICD-10-CM

## 2014-12-04 DIAGNOSIS — R531 Weakness: Secondary | ICD-10-CM

## 2014-12-04 DIAGNOSIS — M25512 Pain in left shoulder: Secondary | ICD-10-CM

## 2014-12-04 NOTE — Therapy (Signed)
[place] MAIN [facility] SERVICES [address] [place], [place], [address] Phone: [phone]   Fax:  [phone]  Physical Therapy Treatment  Patient Details  Name: Kelly Hale MRN: [id number] Date of Birth: [birth year] Referring Provider:  Harlan Stains, MD  Encounter Date: 12/04/2014      PT End of Session - 12/04/14 1528    Visit Number 5   Number of Visits 9   Date for PT Re-Evaluation 12/09/14   PT Start Time 6553   PT Stop Time 1430   PT Time Calculation (min) 45 min   Activity Tolerance Patient tolerated treatment well   Behavior During Therapy [facility] for tasks assessed/performed      Past Medical History  Diagnosis Date  . Asthma   . Hyperlipidemia   . Aspirin allergy   . Morbid obesity   . Tobacco abuse   . CHF (congestive heart failure)     Past Surgical History  Procedure Laterality Date  . Cesarean section    . Tubal ligation    . Knee arthroscopy      right    There were no vitals filed for this visit.  Visit Diagnosis:  Decreased ROM of left shoulder  Pain in joint, shoulder region, left  Weakness      Subjective Assessment - 12/04/14 1349    Subjective  pt reports she was not sore after her last session.  pt reports 3/10 pain currenlty in the anterior shoulder and in her left upper trap.     Patient Stated Goals reduce pain   Currently in Pain? Yes   Pain Score 3    Pain Location Shoulder   Pain Orientation Left        Manual therapy: Left shoulder flexion, abduction and external rotation PROM 2x12 and x10 with small oscillations at end range Pt presents with myofacial trigger point in left upper trap which decreased with ischemic compression  There ex: Active assisted with Ranger flexion and scaption 3x10 in standing, pt required verbal and tactile cues to decrease shoulder shrug  Isometric low row x10 with 3 sec hold.  Bilateral upper trap stretch in sitting 2x30 sec each  Pt noted  decreased pain with repetitions.    left shoulder active ROM pre treatment: Flexion 90 abdudction 90  Left shoulder active ROM post treatment: Flexion: 110 Abduction: 105                               PT Education - 12/04/14 1527    Education provided Yes   Education Details to start performing all her exercises daily    Person(s) Educated Patient   Methods Explanation   Comprehension Verbalized understanding             PT Long Term Goals - 11/12/14 1226    PT LONG TERM GOAL #1   Title pt's active shoulder flexion will improve to at least 120 degrees with no pain in order to do her hair.    Baseline 82 degrees with pain    Time 4   Period Weeks   Status New   PT LONG TERM GOAL #2   Title pt improve shoulder strength to 4+/5 MMT in order to place dishes on top cabinets   Baseline deferred due to pain   Time 4   Period Weeks   Status New   PT LONG TERM GOAL #3   Title  pt's  quick dash will improve by at least 10 points in order to decresae disability/improve quality of life   Baseline 56.81% disability   Time 4   Period Weeks   Status New   PT LONG TERM GOAL #4   Title pt's grip strength difference between left and right will be less than 15# in order to improve functional use of hand such as opening jar   Baseline rigth: 90 #and Left 65 #   Time 4   Period Weeks   Status New               Plan - 12/04/14 1530    Clinical Impression Statement pt responded well to therapy by improving her active shoulder flexion and abduction ROM from 90/90 degrees  to 110/105 degrees.  pt reported decrease in upper trap pain with manual therapy and continues to demonstrate scapulohumeral rhythm dysfunction due by early scapular rotation and increase upper trap actiivaiton but it is improving     Pt will benefit from skilled therapeutic intervention in order to improve on the following deficits Decreased range of motion;Impaired UE functional  use;Postural dysfunction;Decreased strength;Hypomobility;Pain   Rehab Potential Good   PT Frequency 2x / week   PT Duration 4 weeks   PT Treatment/Interventions ADLs/Self Care Home Management;Aquatic Therapy;Biofeedback;Cryotherapy;Electrical Stimulation;Iontophoresis 4mg /ml Dexamethasone;Moist Heat;Therapeutic exercise;Therapeutic activities;Functional mobility training;Traction;Neuromuscular re-education;Patient/family education;Manual techniques;Passive range of motion   PT Next Visit Plan progress HEP        Problem List Patient Active Problem List   Diagnosis Date Noted  . OSA on CPAP 05/06/2014  . Morbid obesity 02/12/2014  . CHF with left ventricular diastolic dysfunction, NYHA class 2 02/10/2014  . Otitis, externa, infective 02/10/2014  . Neuropathic pain, arm 06/19/2011  . Asthma 05/09/2010  . TOBACCO USER 05/06/2010  . COPD with emphysema 05/06/2010   Renford Dills SPT This entire session was performed under direct supervision and direction of a licensed Chiropractor . I have personally read, edited and approve of the note as written. Gorden Harms. [name], PT, DPT [phone]  [name],[name] 12/04/2014, 5:34 PM  [place] MAIN [facility] SERVICES [address] [place], [place], [address] Phone: [phone]   Fax:  [phone]

## 2014-12-09 ENCOUNTER — Ambulatory Visit: Payer: 59

## 2014-12-09 DIAGNOSIS — M25512 Pain in left shoulder: Secondary | ICD-10-CM

## 2014-12-09 DIAGNOSIS — M7582 Other shoulder lesions, left shoulder: Secondary | ICD-10-CM | POA: Diagnosis not present

## 2014-12-09 DIAGNOSIS — M25612 Stiffness of left shoulder, not elsewhere classified: Secondary | ICD-10-CM

## 2014-12-09 DIAGNOSIS — R531 Weakness: Secondary | ICD-10-CM

## 2014-12-09 NOTE — Therapy (Signed)
[place] MAIN [facility] SERVICES [address] [place], [place], [address] Phone: [phone]   Fax:  [phone]  Physical Therapy Treatment  Patient Details  Name: Kelly Hale MRN: [id number] Date of Birth: 02-Aug-1955 Referring Provider:  Harlan Stains, MD  Encounter Date: 12/09/2014      PT End of Session - 12/09/14 1457    Visit Number 6   Number of Visits 17   Date for PT Re-Evaluation 01/09/15   PT Start Time 1125   PT Stop Time 1205   PT Time Calculation (min) 40 min   Equipment Utilized During Treatment Oxygen   Activity Tolerance Patient tolerated treatment well   Behavior During Therapy [facility] for tasks assessed/performed      Past Medical History  Diagnosis Date  . Asthma   . Hyperlipidemia   . Aspirin allergy   . Morbid obesity   . Tobacco abuse   . CHF (congestive heart failure)     Past Surgical History  Procedure Laterality Date  . Cesarean section    . Tubal ligation    . Knee arthroscopy      right    There were no vitals filed for this visit.  Visit Diagnosis:  Decreased ROM of left shoulder  Pain in joint, shoulder region, left  Weakness      Subjective Assessment - 12/09/14 1451    Subjective Pt denies shoulder pain currently at rest upon arrival. She is performing HEP and has noted considerable improving in L shoulder function since starting therapy. No specific questions or concerns at this time.    Patient Stated Goals reduce pain   Currently in Pain? No/denies        OBJECTIVE: Patient completed DASH; Strength assessed: L shoulder: 4/5 flexion (painful), IR, and ER, abduction (painful): 4-/5; R shoulder: 4+/5 flexion, 4/5 IR, ER, and abduction; AROM: L shoulder: flexion: 112, abduction: 86; R shoulder flexion: 151, abduction: 143; Grip strength (best of 3 trials): R: 77.3#, L: 64.7#; Goals updated with patient and HEP reviewed, discussed continuation of therapy;  There-ex: Standing UE  ranger for flexion x 15 with gradual progression of AAROM; Standing low rows RTB x 15; Seated bent elbow rows RTB x 15; Standing ball rolls on wall CW/CCW x 10 each;                         PT Education - 12/09/14 1456    Education provided Yes   Education Details [employer] on wall CW/CCW   Person(s) Educated Patient   Methods Explanation;Demonstration   Comprehension Returned demonstration;Verbalized understanding             PT Long Term Goals - 12/09/14 1518    PT LONG TERM GOAL #1   Title pt's active shoulder flexion will improve to at least 120 degrees with no pain in order to do her hair.    Baseline 82 degrees with pain initial evaluation; 12/09/14: 112 degrees with pain   Time 4   Period Weeks   Status On-going   PT LONG TERM GOAL #2   Title pt improve shoulder strength to 4+/5 MMT in order to place dishes on top cabinets   Baseline 12/09/14: 4/5 L shoulder flexion, ER, IR; 4-/5 L shoulder abduction   Time 4   Period Weeks   Status New   PT LONG TERM GOAL #3   Title pt's  quick dash will improve by at least 10  points in order to decresae disability/improve quality of life   Baseline 56.81% disability at initial evaluation, 12/09/14: 18.2%   Time 4   Period Weeks   Status New   PT LONG TERM GOAL #4   Title pt's grip strength difference between left and right will be less than 15# in order to improve functional use of hand such as opening jar   Baseline right: 90# and Left 65# at initial evaluation; 12/09/14: right: 77.3#; left: 64.7# (best of three trials)    Time 4   Period Weeks   Status Achieved               Plan - 12/09/14 1459    Clinical Impression Statement Pt demonstrates considerable improvement in self-perceived disability with reduction in DASH from 56.8% at initial evaluation to 18.2%. She still presents with limitations in L shoulder flexion and abduction AROM and strength as well as pain at available end range. Pt will  benefit from an additional 4 weeks of skilled PT services to address deficits in L shoulder strength, pain, and ROM in order to further improve function at home and with recreational activities.    Pt will benefit from skilled therapeutic intervention in order to improve on the following deficits Decreased range of motion;Impaired UE functional use;Postural dysfunction;Decreased strength;Hypomobility;Pain   Rehab Potential Good   PT Frequency 2x / week   PT Duration 4 weeks   PT Treatment/Interventions ADLs/Self Care Home Management;Aquatic Therapy;Biofeedback;Cryotherapy;Electrical Stimulation;Iontophoresis 4mg /ml Dexamethasone;Moist Heat;Therapeutic exercise;Therapeutic activities;Functional mobility training;Traction;Neuromuscular re-education;Patient/family education;Manual techniques;Passive range of motion   PT Next Visit Plan L shoulder strengthening and ROM progression.    PT Home Exercise Plan added ball rolls on wall to HEP   Consulted and Agree with Plan of Care Patient        Problem List Patient Active Problem List   Diagnosis Date Noted  . OSA on CPAP 05/06/2014  . Morbid obesity 02/12/2014  . CHF with left ventricular diastolic dysfunction, NYHA class 2 02/10/2014  . Otitis, externa, infective 02/10/2014  . Neuropathic pain, arm 06/19/2011  . Asthma 05/09/2010  . TOBACCO USER 05/06/2010  . COPD with emphysema 05/06/2010   Phillips Grout PT, DPT   Jera Headings 12/09/2014, 3:30 PM  [place] MAIN [facility] SERVICES [address] [place], [place], [address] Phone: [phone]   Fax:  [phone]

## 2014-12-11 ENCOUNTER — Ambulatory Visit: Payer: 59

## 2014-12-16 ENCOUNTER — Ambulatory Visit: Payer: 59 | Attending: Family Medicine

## 2014-12-16 DIAGNOSIS — M7582 Other shoulder lesions, left shoulder: Secondary | ICD-10-CM | POA: Diagnosis not present

## 2014-12-16 DIAGNOSIS — M25612 Stiffness of left shoulder, not elsewhere classified: Secondary | ICD-10-CM

## 2014-12-16 DIAGNOSIS — R531 Weakness: Secondary | ICD-10-CM | POA: Diagnosis present

## 2014-12-16 DIAGNOSIS — M25512 Pain in left shoulder: Secondary | ICD-10-CM

## 2014-12-16 NOTE — Therapy (Signed)
[place] MAIN [facility] SERVICES [address] [place], [place], [address] Phone: [phone]   Fax:  [phone]  Physical Therapy Treatment  Patient Details  Name: Kelly Hale MRN: [id number] Date of Birth: 1956/02/23 Referring Provider:  Harlan Stains, MD  Encounter Date: 12/16/2014      PT End of Session - 12/16/14 1601    Visit Number 7   Number of Visits 17   Date for PT Re-Evaluation 01/09/15   PT Start Time 1116   PT Stop Time 1200   PT Time Calculation (min) 44 min   Equipment Utilized During Treatment Oxygen   Activity Tolerance Patient tolerated treatment well   Behavior During Therapy [facility] for tasks assessed/performed      Past Medical History  Diagnosis Date  . Asthma   . Hyperlipidemia   . Aspirin allergy   . Morbid obesity   . Tobacco abuse   . CHF (congestive heart failure)     Past Surgical History  Procedure Laterality Date  . Cesarean section    . Tubal ligation    . Knee arthroscopy      right    There were no vitals filed for this visit.  Visit Diagnosis:  Decreased ROM of left shoulder  Pain in joint, shoulder region, left  Weakness      Subjective Assessment - 12/16/14 1600    Subjective Pt denies any pain currently and reports she is able to put do everything at home and her most challenging activity is putting dishes in top cabinet with left UE.     Patient Stated Goals reduce pain   Currently in Pain? No/denies   Pain Score 0-No pain         There-ex: Standing UE ranger for flexion 3x 10 with gradual progression of AAROM; Standing UE ranger for scaption 3x10 with gradual progression of AAROM Wall slides (flexion, abduction) with gradual progression of AAROM 3x10 Isometric low row x10 with 3 sec hold.  Pt required verbal and tactile cueing for proper exercise technique Pt reported fatigue after exercises   Manual therapy: Left shoulder flexion, abduction and external rotation  PROM 3x15 and with small oscillations at end range   left shoulder active ROM pre treatment: Flexion 115 abdudction 88                      PT Education - 12/16/14 1600    Education provided Yes   Education Details wall and table slides for HEP, reviewed HEP for correct technique    Person(s) Educated Patient   Methods Explanation;Demonstration   Comprehension Verbalized understanding;Returned demonstration             PT Long Term Goals - 12/09/14 1518    PT LONG TERM GOAL #1   Title pt's active shoulder flexion will improve to at least 120 degrees with no pain in order to do her hair.    Baseline 82 degrees with pain initial evaluation; 12/09/14: 112 degrees with pain   Time 4   Period Weeks   Status On-going   PT LONG TERM GOAL #2   Title pt improve shoulder strength to 4+/5 MMT in order to place dishes on top cabinets   Baseline 12/09/14: 4/5 L shoulder flexion, ER, IR; 4-/5 L shoulder abduction   Time 4   Period Weeks   Status New   PT LONG TERM GOAL #3   Title pt's  quick dash will improve by  at least 10 points in order to decresae disability/improve quality of life   Baseline 56.81% disability at initial evaluation, 12/09/14: 18.2%   Time 4   Period Weeks   Status New   PT LONG TERM GOAL #4   Title pt's grip strength difference between left and right will be less than 15# in order to improve functional use of hand such as opening jar   Baseline right: 90# and Left 65# at initial evaluation; 12/09/14: right: 77.3#; left: 64.7# (best of three trials)    Time 4   Period Weeks   Status Achieved               Plan - 12/16/14 1607    Clinical Impression Statement Pt responded well to progression of therex today with minimal increase in pain.  Pt experienced right low back pain with wall slides and shoulder extension which decreased after verbal and tactile cueing for correct exercise technique.      Pt will benefit from skilled therapeutic  intervention in order to improve on the following deficits Decreased range of motion;Impaired UE functional use;Postural dysfunction;Decreased strength;Hypomobility;Pain   Rehab Potential Good   PT Frequency 2x / week   PT Duration 4 weeks   PT Treatment/Interventions ADLs/Self Care Home Management;Aquatic Therapy;Biofeedback;Cryotherapy;Electrical Stimulation;Iontophoresis 4mg /ml Dexamethasone;Moist Heat;Therapeutic exercise;Therapeutic activities;Functional mobility training;Traction;Neuromuscular re-education;Patient/family education;Manual techniques;Passive range of motion   PT Next Visit Plan L shoulder strengthening and ROM progression.    Consulted and Agree with Plan of Care Patient        Problem List Patient Active Problem List   Diagnosis Date Noted  . OSA on CPAP 05/06/2014  . Morbid obesity 02/12/2014  . CHF with left ventricular diastolic dysfunction, NYHA class 2 02/10/2014  . Otitis, externa, infective 02/10/2014  . Neuropathic pain, arm 06/19/2011  . Asthma 05/09/2010  . TOBACCO USER 05/06/2010  . COPD with emphysema 05/06/2010   Renford Dills, SPT This entire session was performed under direct supervision and direction of a licensed therapist/therapist assistant . I have personally read, edited and approve of the note as written. Gorden Harms. [name], PT, DPT [phone]  [name],[name] 12/17/2014, 11:01 AM   [facility] MAIN [facility] SERVICES [address] [place], [place], [address] Phone: [phone]   Fax:  [phone]

## 2014-12-18 ENCOUNTER — Ambulatory Visit: Payer: 59

## 2014-12-18 DIAGNOSIS — M7582 Other shoulder lesions, left shoulder: Secondary | ICD-10-CM | POA: Diagnosis not present

## 2014-12-18 DIAGNOSIS — R531 Weakness: Secondary | ICD-10-CM

## 2014-12-18 DIAGNOSIS — M25512 Pain in left shoulder: Secondary | ICD-10-CM

## 2014-12-18 DIAGNOSIS — M25612 Stiffness of left shoulder, not elsewhere classified: Secondary | ICD-10-CM

## 2014-12-18 NOTE — Therapy (Signed)
[place] MAIN [facility] SERVICES [address] [place], [place], [address] Phone: [phone]   Fax:  [phone]  Physical Therapy Treatment  Patient Details  Name: Kelly Hale MRN: [id number] Date of Birth: [birth year] Referring Provider:  Harlan Stains, MD  Encounter Date: 12/18/2014      PT End of Session - 12/18/14 1416    Visit Number 8   Number of Visits 17   Date for PT Re-Evaluation 01/09/15   PT Start Time 1116   PT Stop Time 1200   PT Time Calculation (min) 44 min   Equipment Utilized During Treatment Oxygen   Activity Tolerance Patient tolerated treatment well   Behavior During Therapy [facility] for tasks assessed/performed      Past Medical History  Diagnosis Date  . Asthma   . Hyperlipidemia   . Aspirin allergy   . Morbid obesity   . Tobacco abuse   . CHF (congestive heart failure)     Past Surgical History  Procedure Laterality Date  . Cesarean section    . Tubal ligation    . Knee arthroscopy      right    There were no vitals filed for this visit.  Visit Diagnosis:  Decreased ROM of left shoulder  Pain in joint, shoulder region, left  Weakness      Subjective Assessment - 12/18/14 1414    Subjective Pt relates she is tired today, denies any pain and is compliant with HEP.      Patient Stated Goals reduce pain   Currently in Pain? No/denies   Pain Score 0-No pain        There-ex: Standing UE ranger for flexion 3x 10 with gradual progression of AAROM; Standing UE ranger for scaption 3x10 with gradual progression of AAROM Standing ball rolls on wall CW/CCW x 10 each Standing and supine D1 extension and D2 flexion, pt required mod verbal and tactile cueing for proper form and decreasing shoulder shrugging  Pt required verbal and tactile cueing for proper exercise technique Pt reported fatigue after exercises   Manual therapy: Left shoulder flexion, abduction and external rotation PROM 3x15 and with  small oscillations at end range Ischemic compression to myofascial restriction in left biceps and pt noted decreased pain and pt was able to perform there ex with improved range and smooth movement.                             PT Education - 12/18/14 1415    Education provided Yes   Education Details plan of care and HEP (see instructions)   Person(s) Educated Patient   Methods Explanation;Demonstration   Comprehension Verbalized understanding;Returned demonstration             PT Long Term Goals - 12/09/14 1518    PT LONG TERM GOAL #1   Title pt's active shoulder flexion will improve to at least 120 degrees with no pain in order to do her hair.    Baseline 82 degrees with pain initial evaluation; 12/09/14: 112 degrees with pain   Time 4   Period Weeks   Status On-going   PT LONG TERM GOAL #2   Title pt improve shoulder strength to 4+/5 MMT in order to place dishes on top cabinets   Baseline 12/09/14: 4/5 L shoulder flexion, ER, IR; 4-/5 L shoulder abduction   Time 4   Period Weeks   Status New   PT  LONG TERM GOAL #3   Title pt's  quick dash will improve by at least 10 points in order to decresae disability/improve quality of life   Baseline 56.81% disability at initial evaluation, 12/09/14: 18.2%   Time 4   Period Weeks   Status New   PT LONG TERM GOAL #4   Title pt's grip strength difference between left and right will be less than 15# in order to improve functional use of hand such as opening jar   Baseline right: 90# and Left 65# at initial evaluation; 12/09/14: right: 77.3#; left: 64.7# (best of three trials)    Time 4   Period Weeks   Status Achieved               Plan - 12/18/14 1419    Clinical Impression Statement Pt responded well to manual therapy in decreasing anterior superior arm pain (biceps) and improving the range and quality of movement during progression of there ex today.  Pt would benefit from continued PT services to  improve deficits to return to prior level of function.     Pt will benefit from skilled therapeutic intervention in order to improve on the following deficits Decreased range of motion;Impaired UE functional use;Postural dysfunction;Decreased strength;Hypomobility;Pain   Rehab Potential Good   PT Frequency 2x / week   PT Duration 4 weeks   PT Treatment/Interventions ADLs/Self Care Home Management;Aquatic Therapy;Biofeedback;Cryotherapy;Electrical Stimulation;Iontophoresis 4mg /ml Dexamethasone;Moist Heat;Therapeutic exercise;Therapeutic activities;Functional mobility training;Traction;Neuromuscular re-education;Patient/family education;Manual techniques;Passive range of motion   PT Next Visit Plan L shoulder strengthening and ROM progression.    Consulted and Agree with Plan of Care Patient        Problem List Patient Active Problem List   Diagnosis Date Noted  . OSA on CPAP 05/06/2014  . Morbid obesity 02/12/2014  . CHF with left ventricular diastolic dysfunction, NYHA class 2 02/10/2014  . Otitis, externa, infective 02/10/2014  . Neuropathic pain, arm 06/19/2011  . Asthma 05/09/2010  . TOBACCO USER 05/06/2010  . COPD with emphysema 05/06/2010   Renford Dills, SPT  This entire session was performed under direct supervision and direction of a licensed therapist/therapist assistant . I have personally read, edited and approve of the note as written. Gorden Harms. [name], PT, DPT [phone]  [name],[name] 12/18/2014, 3:02 PM  [place] MAIN [facility] SERVICES [address] [place], [place], [address] Phone: [phone]   Fax:  [phone]

## 2014-12-18 NOTE — Patient Instructions (Signed)
[email or web address] D1 extension AROM 2x10 D2 flexion AROM 2x10 In standing and/or sitting

## 2014-12-23 ENCOUNTER — Ambulatory Visit: Payer: 59

## 2014-12-23 DIAGNOSIS — M25612 Stiffness of left shoulder, not elsewhere classified: Secondary | ICD-10-CM

## 2014-12-23 DIAGNOSIS — M7582 Other shoulder lesions, left shoulder: Secondary | ICD-10-CM | POA: Diagnosis not present

## 2014-12-23 DIAGNOSIS — M25512 Pain in left shoulder: Secondary | ICD-10-CM

## 2014-12-23 DIAGNOSIS — R531 Weakness: Secondary | ICD-10-CM

## 2014-12-23 NOTE — Therapy (Signed)
[place] MAIN [facility] SERVICES [address] [place], [place], [address] Phone: [phone]   Fax:  [phone]  Physical Therapy Treatment  Patient Details  Name: Kelly Hale MRN: [id number] Date of Birth: 1955/08/18 Referring Provider:  Harlan Stains, MD  Encounter Date: 12/23/2014      PT End of Session - 12/23/14 [place]    Visit Number 9   Number of Visits 17   Date for PT Re-Evaluation 01/09/15   PT Start Time 1500   PT Stop Time 1545   PT Time Calculation (min) 45 min   Equipment Utilized During Treatment Oxygen   Activity Tolerance Patient tolerated treatment well   Behavior During Therapy [facility] for tasks assessed/performed      Past Medical History  Diagnosis Date  . Asthma   . Hyperlipidemia   . Aspirin allergy   . Morbid obesity   . Tobacco abuse   . CHF (congestive heart failure)     Past Surgical History  Procedure Laterality Date  . Cesarean section    . Tubal ligation    . Knee arthroscopy      right    There were no vitals filed for this visit.  Visit Diagnosis:  Decreased ROM of left shoulder  Pain in joint, shoulder region, left  Weakness      Subjective Assessment - 12/23/14 1828    Subjective Pt reports she is doing well and was able to go crabbing and clam digging on Saturday. Pt has been experiencing sharp pain on the anterior aspect of her shoulder since Sunday but has been steadily improving.   She currently has 3/10 "achy" pain and had 6/10 Sunday and thinks it was due to increased activity on Saturday.    Patient Stated Goals reduce pain   Currently in Pain? Yes   Pain Score 3    Pain Location Shoulder   Pain Orientation Left   Pain Descriptors / Indicators Aching         There-ex: Standing UE ranger for flexion 2x 10 with gradual progression of AAROM; Standing UE ranger for scaption 2x10 with gradual progression of AAROM pec stretch (wall stretch) 2 x 30 sec Upper trap and levator  scapulae stretch 2x30 sec each Pt required verbal and tactile cueing for proper exercise technique Pt reported fatigue after exercises   Manual therapy: Extensive left upper trap/levator scapulae soft tissue mobilization  Ischemic compression to myofascial restriction in left biceps  MET to increase horizontal abduction in supine   pt noted decreased pain and pt was able to perform there ex with improved range and smooth movement.                      PT Education - 12/23/14 1828    Education provided Yes   Education Details to ice for ~10-15 for the next few days to help decrease shoulder pain, perform trap and levator stretches and pec wall stretch to decrease tightness    Person(s) Educated Patient   Methods Explanation;Demonstration   Comprehension Verbalized understanding;Returned demonstration             PT Long Term Goals - 12/09/14 1518    PT LONG TERM GOAL #1   Title pt's active shoulder flexion will improve to at least 120 degrees with no pain in order to do her hair.    Baseline 82 degrees with pain initial evaluation; 12/09/14: 112 degrees with pain   Time 4  Period Weeks   Status On-going   PT LONG TERM GOAL #2   Title pt improve shoulder strength to 4+/5 MMT in order to place dishes on top cabinets   Baseline 12/09/14: 4/5 L shoulder flexion, ER, IR; 4-/5 L shoulder abduction   Time 4   Period Weeks   Status New   PT LONG TERM GOAL #3   Title pt's  quick dash will improve by at least 10 points in order to decresae disability/improve quality of life   Baseline 56.81% disability at initial evaluation, 12/09/14: 18.2%   Time 4   Period Weeks   Status New   PT LONG TERM GOAL #4   Title pt's grip strength difference between left and right will be less than 15# in order to improve functional use of hand such as opening jar   Baseline right: 90# and Left 65# at initial evaluation; 12/09/14: right: 77.3#; left: 64.7# (best of three trials)    Time 4    Period Weeks   Status Achieved               Plan - 12/23/14 1836    Clinical Impression Statement pt presented increased shoulder pain and upper trap pain today, likely due from increased physical activity on Saturday.  SPT modified session by performing manual therapy to decrease upper trap and anterior shoulder pain (over pec tendon/ long head of biceps).     Pt will benefit from skilled therapeutic intervention in order to improve on the following deficits Decreased range of motion;Impaired UE functional use;Postural dysfunction;Decreased strength;Hypomobility;Pain   Rehab Potential Good   PT Frequency 2x / week   PT Duration 4 weeks   PT Treatment/Interventions ADLs/Self Care Home Management;Aquatic Therapy;Biofeedback;Cryotherapy;Electrical Stimulation;Iontophoresis 78m/ml Dexamethasone;Moist Heat;Therapeutic exercise;Therapeutic activities;Functional mobility training;Traction;Neuromuscular re-education;Patient/family education;Manual techniques;Passive range of motion   PT Next Visit Plan L shoulder strengthening and ROM progression.    Consulted and Agree with Plan of Care Patient        Problem List Patient Active Problem List   Diagnosis Date Noted  . OSA on CPAP 05/06/2014  . Morbid obesity 02/12/2014  . CHF with left ventricular diastolic dysfunction, NYHA class 2 02/10/2014  . Otitis, externa, infective 02/10/2014  . Neuropathic pain, arm 06/19/2011  . Asthma 05/09/2010  . TOBACCO USER 05/06/2010  . COPD with emphysema 05/06/2010   [name] SPT This entire session was performed under direct supervision and direction of a licensed therapist/therapist assistant . I have personally read, edited and approve of the note as written. AGorden Harms [name], PT, DPT #[id number] [name],[name] 12/24/2014, 8:04 AM  [place] [facility] [address] [address]: [phone]  Fax:   [phone]

## 2014-12-25 ENCOUNTER — Ambulatory Visit: Payer: 59

## 2014-12-25 DIAGNOSIS — M7582 Other shoulder lesions, left shoulder: Secondary | ICD-10-CM | POA: Diagnosis not present

## 2014-12-25 DIAGNOSIS — M25612 Stiffness of left shoulder, not elsewhere classified: Secondary | ICD-10-CM

## 2014-12-25 DIAGNOSIS — M25512 Pain in left shoulder: Secondary | ICD-10-CM

## 2014-12-25 NOTE — Therapy (Signed)
[place] MAIN [facility] SERVICES [address] [place], [place], [address] Phone: [phone]   Fax:  [phone]  Physical Therapy Treatment  Patient Details  Name: Kelly Hale MRN: [id number] Date of Birth: October 12, 1955 Referring Provider:  Harlan Stains, MD  Encounter Date: 12/25/2014      PT End of Session - 12/25/14 1532    Visit Number 10   Number of Visits 17   Date for PT Re-Evaluation 01/09/15   PT Start Time 3614   PT Stop Time 1425   PT Time Calculation (min) 40 min   Equipment Utilized During Treatment Oxygen   Activity Tolerance Patient tolerated treatment well   Behavior During Therapy [facility] for tasks assessed/performed      Past Medical History  Diagnosis Date  . Asthma   . Hyperlipidemia   . Aspirin allergy   . Morbid obesity   . Tobacco abuse   . CHF (congestive heart failure)     Past Surgical History  Procedure Laterality Date  . Cesarean section    . Tubal ligation    . Knee arthroscopy      right    There were no vitals filed for this visit.  Visit Diagnosis:  Pain in joint, shoulder region, left  Decreased ROM of left shoulder      Subjective Assessment - 12/25/14 1519    Subjective pt reports she is pleased with her results so far. she still has pain , but it is much less and she is able to wash her hair.    Patient Stated Goals reduce pain   Currently in Pain? Yes   Pain Score 3    Pain Orientation --  L anterior shoulder   Pain Onset More than a month ago      Therex: Wall slide with lift off 2x10 D1 shoulder extension red band 2x10 D2 flexion with assist from red band over door 2x10 UE ranger- flexion x 10 UE ranger ABCs A-Z sidelying Abduction 1lb 2x10 sidelying abduction 90 deg CW/CCW circles sidelying abduction at 60 deg CW/CCW circles x 20 each Sidelying ER 2x10- 1lb Mobilization with movement- shoulder flexion with posterior inferior glide with mob belt 3x10 Pt needs min-mod  cues for proper exercise performance.                            PT Education - 12/25/14 1530    Education provided Yes   Education Details diaphragmatic breathing   Person(s) Educated Patient   Methods Explanation   Comprehension Verbalized understanding             PT Long Term Goals - 12/09/14 1518    PT LONG TERM GOAL #1   Title pt's active shoulder flexion will improve to at least 120 degrees with no pain in order to do her hair.    Baseline 82 degrees with pain initial evaluation; 12/09/14: 112 degrees with pain   Time 4   Period Weeks   Status On-going   PT LONG TERM GOAL #2   Title pt improve shoulder strength to 4+/5 MMT in order to place dishes on top cabinets   Baseline 12/09/14: 4/5 L shoulder flexion, ER, IR; 4-/5 L shoulder abduction   Time 4   Period Weeks   Status New   PT LONG TERM GOAL #3   Title pt's  quick dash will improve by at least 10 points in order to decresae  disability/improve quality of life   Baseline 56.81% disability at initial evaluation, 12/09/14: 18.2%   Time 4   Period Weeks   Status New   PT LONG TERM GOAL #4   Title pt's grip strength difference between left and right will be less than 15# in order to improve functional use of hand such as opening jar   Baseline right: 90# and Left 65# at initial evaluation; 12/09/14: right: 77.3#; left: 64.7# (best of three trials)    Time 4   Period Weeks   Status Achieved               Plan - 12/25/14 1534    Clinical Impression Statement pt responded well to mobilization with movement with less restriction and pain with shoulder flexion. pt continues to progress with strengthening exercises with minimal pain. progressed scap strengtheing today to include lower trap to reduce over use of upper trap. also discussed diaphragmatic breathing in order to ease stress on accessory breathing muscles    Pt will benefit from skilled therapeutic intervention in order to improve on  the following deficits Decreased range of motion;Impaired UE functional use;Postural dysfunction;Decreased strength;Hypomobility;Pain   Rehab Potential Good   PT Frequency 2x / week   PT Duration 4 weeks   PT Treatment/Interventions ADLs/Self Care Home Management;Aquatic Therapy;Biofeedback;Cryotherapy;Electrical Stimulation;Iontophoresis 4mg /ml Dexamethasone;Moist Heat;Therapeutic exercise;Therapeutic activities;Functional mobility training;Traction;Neuromuscular re-education;Patient/family education;Manual techniques;Passive range of motion   PT Next Visit Plan L shoulder strengthening and ROM progression.    Consulted and Agree with Plan of Care Patient        Problem List Patient Active Problem List   Diagnosis Date Noted  . OSA on CPAP 05/06/2014  . Morbid obesity 02/12/2014  . CHF with left ventricular diastolic dysfunction, NYHA class 2 02/10/2014  . Otitis, externa, infective 02/10/2014  . Neuropathic pain, arm 06/19/2011  . Asthma 05/09/2010  . TOBACCO USER 05/06/2010  . COPD with emphysema 05/06/2010   Gorden Harms. Nicholad Kautzman, PT, DPT [phone]  Eshika Reckart 12/25/2014, 3:42 PM  [place] MAIN [facility] SERVICES [address] [place], [place], [address] Phone: [phone]   Fax:  [phone]

## 2014-12-25 NOTE — Patient Instructions (Signed)
[email or web address] Wall slide with lift off 2x10

## 2014-12-29 ENCOUNTER — Ambulatory Visit: Payer: 59

## 2014-12-29 DIAGNOSIS — R531 Weakness: Secondary | ICD-10-CM

## 2014-12-29 DIAGNOSIS — M7582 Other shoulder lesions, left shoulder: Secondary | ICD-10-CM | POA: Diagnosis not present

## 2014-12-29 DIAGNOSIS — M25512 Pain in left shoulder: Secondary | ICD-10-CM

## 2014-12-29 DIAGNOSIS — M25612 Stiffness of left shoulder, not elsewhere classified: Secondary | ICD-10-CM

## 2014-12-30 NOTE — Therapy (Signed)
[place] MAIN [facility] SERVICES [address] [place], [place], [address] Phone: [phone]   Fax:  [phone]  Physical Therapy Treatment  Patient Details  Name: Kelly Hale MRN: [id number] Date of Birth: [birth year] Referring Provider:  Harlan Stains, MD  Encounter Date: 12/29/2014      PT End of Session - 12/30/14 0734    Visit Number 11   Number of Visits 17   Date for PT Re-Evaluation 01/09/15   PT Start Time 2878   PT Stop Time 1530   PT Time Calculation (min) 45 min   Equipment Utilized During Treatment Oxygen   Activity Tolerance Patient tolerated treatment well   Behavior During Therapy [facility] for tasks assessed/performed      Past Medical History  Diagnosis Date  . Asthma   . Hyperlipidemia   . Aspirin allergy   . Morbid obesity   . Tobacco abuse   . CHF (congestive heart failure)     Past Surgical History  Procedure Laterality Date  . Cesarean section    . Tubal ligation    . Knee arthroscopy      right    There were no vitals filed for this visit.  Visit Diagnosis:  Pain in joint, shoulder region, left  Decreased ROM of left shoulder  Weakness      Subjective Assessment - 12/30/14 0734    Subjective Pt reports she has been doing well since her last session. pt was sore after her last session until the following afternoon. Pt feels her motion is almost to PROL and denies any pain currently.      Patient Stated Goals reduce pain   Currently in Pain? No/denies   Pain Score 0-No pain   Pain Onset More than a month ago        Wall slide with lift off 2x10 D1 shoulder extension in supine x 10 D2 flexion with assist from red band over x 10, pt fatigued quickly after 10 reps D2 flexion in supine  UE ranger- flexion x 10 UE ranger ABCs A-Z Standing with left shoulder 90 degrees flexion and CW/CCW circles with yellow medicine ball x5 each direction sidelying Abduction  2x10 Supine punches with 2# x10  and 3# x10 Pt needs min-mod cues for proper exercise performance.  Manual therapy: Mobilization with movement- shoulder flexion with posterior inferior glide with mob belt 3x10 Soft tissue mobilization to left shoulder deltoid and upper trap, pt noted decreased tension and pain after  pre manual therapy AROM 115 degrees flexion and abduction  Post manual therapy AROM: 120 degrees flexion and abduction                         PT Education - 12/30/14 0734    Education provided Yes   Education Details new there ex    Person(s) Educated Patient   Methods Explanation;Demonstration   Comprehension Verbalized understanding;Returned demonstration             PT Long Term Goals - 12/09/14 1518    PT LONG TERM GOAL #1   Title pt's active shoulder flexion will improve to at least 120 degrees with no pain in order to do her hair.    Baseline 82 degrees with pain initial evaluation; 12/09/14: 112 degrees with pain   Time 4   Period Weeks   Status On-going   PT LONG TERM GOAL #2   Title pt improve shoulder strength to 4+/5  MMT in order to place dishes on top cabinets   Baseline 12/09/14: 4/5 L shoulder flexion, ER, IR; 4-/5 L shoulder abduction   Time 4   Period Weeks   Status New   PT LONG TERM GOAL #3   Title pt's  quick dash will improve by at least 10 points in order to decresae disability/improve quality of life   Baseline 56.81% disability at initial evaluation, 12/09/14: 18.2%   Time 4   Period Weeks   Status New   PT LONG TERM GOAL #4   Title pt's grip strength difference between left and right will be less than 15# in order to improve functional use of hand such as opening jar   Baseline right: 90# and Left 65# at initial evaluation; 12/09/14: right: 77.3#; left: 64.7# (best of three trials)    Time 4   Period Weeks   Status Achieved               Plan - 12/30/14 3149    Clinical Impression Statement Pt responded well to manual therapy with  improved active shoulder ROM but was fatigued the rest of the session likely due to exercising ~40 minutes prior to today's session.  pt increases left shoulder shrug as she fatigues and developed upper trap pain that improved with manual therapy.     Pt will benefit from skilled therapeutic intervention in order to improve on the following deficits Decreased range of motion;Impaired UE functional use;Postural dysfunction;Decreased strength;Hypomobility;Pain   Rehab Potential Good   PT Frequency 2x / week   PT Duration 4 weeks   PT Treatment/Interventions ADLs/Self Care Home Management;Aquatic Therapy;Biofeedback;Cryotherapy;Electrical Stimulation;Iontophoresis 4mg /ml Dexamethasone;Moist Heat;Therapeutic exercise;Therapeutic activities;Functional mobility training;Traction;Neuromuscular re-education;Patient/family education;Manual techniques;Passive range of motion   PT Next Visit Plan L shoulder strengthening and ROM progression.    Consulted and Agree with Plan of Care Patient        Problem List Patient Active Problem List   Diagnosis Date Noted  . OSA on CPAP 05/06/2014  . Morbid obesity 02/12/2014  . CHF with left ventricular diastolic dysfunction, NYHA class 2 02/10/2014  . Otitis, externa, infective 02/10/2014  . Neuropathic pain, arm 06/19/2011  . Asthma 05/09/2010  . TOBACCO USER 05/06/2010  . COPD with emphysema 05/06/2010   Renford Dills, SPT This entire session was performed under direct supervision and direction of a licensed therapist/therapist assistant . I have personally read, edited and approve of the note as written. Gorden Harms. [name], PT, DPT [phone]  [name],[name] 12/30/2014, 1:21 PM  [place] MAIN [facility] SERVICES [address] [place], [place], [address] Phone: [phone]   Fax:  [phone]

## 2014-12-31 ENCOUNTER — Ambulatory Visit: Payer: 59

## 2014-12-31 ENCOUNTER — Encounter: Payer: Self-pay | Admitting: Internal Medicine

## 2014-12-31 ENCOUNTER — Ambulatory Visit (INDEPENDENT_AMBULATORY_CARE_PROVIDER_SITE_OTHER): Payer: 59 | Admitting: Internal Medicine

## 2014-12-31 VITALS — BP 130/84 | HR 108 | Wt 276.0 lb

## 2014-12-31 DIAGNOSIS — J432 Centrilobular emphysema: Secondary | ICD-10-CM

## 2014-12-31 DIAGNOSIS — G4733 Obstructive sleep apnea (adult) (pediatric): Secondary | ICD-10-CM

## 2014-12-31 DIAGNOSIS — M25512 Pain in left shoulder: Secondary | ICD-10-CM

## 2014-12-31 DIAGNOSIS — M25612 Stiffness of left shoulder, not elsewhere classified: Secondary | ICD-10-CM

## 2014-12-31 DIAGNOSIS — Z720 Tobacco use: Secondary | ICD-10-CM | POA: Diagnosis not present

## 2014-12-31 DIAGNOSIS — R531 Weakness: Secondary | ICD-10-CM

## 2014-12-31 DIAGNOSIS — Z9989 Dependence on other enabling machines and devices: Principal | ICD-10-CM

## 2014-12-31 DIAGNOSIS — F17200 Nicotine dependence, unspecified, uncomplicated: Secondary | ICD-10-CM

## 2014-12-31 DIAGNOSIS — M7582 Other shoulder lesions, left shoulder: Secondary | ICD-10-CM | POA: Diagnosis not present

## 2014-12-31 MED ORDER — AZELASTINE-FLUTICASONE 137-50 MCG/ACT NA SUSP: 1.0000 | Freq: Every day | NASAL | Status: DC

## 2014-12-31 NOTE — Patient Instructions (Addendum)
Follow up with Dr. Stevenson Clinch in: 6 months - cont with COPD inhalers - dymista (137/50) - 1 spray  to each nostril twice a day, for 3 days, then use has needed for allergies/nasal or sinus congestion (1 spray to each nostril BID) - cont with gym/pulm rehab - cont with diet and exercise - please cut back and eventually stop smoking - cont with CPAP and use of supplemental oxygen

## 2014-12-31 NOTE — Therapy (Signed)
[place] MAIN [facility] SERVICES [address] [place], [place], [address] Phone: [phone]   Fax:  [phone]  Physical Therapy Treatment  Patient Details  Name: Kelly Hale MRN: [id number] Date of Birth: [birth year] Referring Provider:  Harlan Stains, MD  Encounter Date: 12/31/2014      PT End of Session - 12/31/14 1852    Visit Number 12   Number of Visits 17   Date for PT Re-Evaluation 01/09/15   PT Start Time 1655   PT Stop Time 1600   PT Time Calculation (min) 45 min   Equipment Utilized During Treatment Oxygen   Activity Tolerance Patient tolerated treatment well   Behavior During Therapy [facility] for tasks assessed/performed      Past Medical History  Diagnosis Date  . Asthma   . Hyperlipidemia   . Aspirin allergy   . Morbid obesity   . Tobacco abuse   . CHF (congestive heart failure)     Past Surgical History  Procedure Laterality Date  . Cesarean section    . Tubal ligation    . Knee arthroscopy      right    There were no vitals filed for this visit.  Visit Diagnosis:  Pain in joint, shoulder region, left  Decreased ROM of left shoulder  Weakness      Subjective Assessment - 12/31/14 1851    Subjective Pt reports everything went well with her MD appointments.  Pt has had anterior left shoulder pain since her last visit and has tried icing and resting her arm and currently has 4/10.     Patient Stated Goals reduce pain   Currently in Pain? Yes   Pain Score 4    Pain Location Shoulder   Pain Orientation Left   Pain Onset More than a month ago       Manual therapy: Pain along biceps tendon Extensive soft tissue mobilization to left biceps to decrease soft tissue myofacial restrictions and pain in her biceps tendon  PA and AP GH glides grade 3 3x20 sec Pt noted decreased pain and tension   There ex: Biceps stretch 3x30 sec Pt required verbal and visual cueing for correct  technique                           PT Education - 12/31/14 1852    Education provided Yes   Education Details plan of care and biceps stretch    Person(s) Educated Patient   Methods Explanation;Demonstration   Comprehension Verbalized understanding;Returned demonstration             PT Long Term Goals - 12/09/14 1518    PT LONG TERM GOAL #1   Title pt's active shoulder flexion will improve to at least 120 degrees with no pain in order to do her hair.    Baseline 82 degrees with pain initial evaluation; 12/09/14: 112 degrees with pain   Time 4   Period Weeks   Status On-going   PT LONG TERM GOAL #2   Title pt improve shoulder strength to 4+/5 MMT in order to place dishes on top cabinets   Baseline 12/09/14: 4/5 L shoulder flexion, ER, IR; 4-/5 L shoulder abduction   Time 4   Period Weeks   Status New   PT LONG TERM GOAL #3   Title pt's  quick dash will improve by at least 10 points in order to decresae disability/improve quality of life  Baseline 56.81% disability at initial evaluation, 12/09/14: 18.2%   Time 4   Period Weeks   Status New   PT LONG TERM GOAL #4   Title pt's grip strength difference between left and right will be less than 15# in order to improve functional use of hand such as opening jar   Baseline right: 90# and Left 65# at initial evaluation; 12/09/14: right: 77.3#; left: 64.7# (best of three trials)    Time 4   Period Weeks   Status Achieved               Plan - 12/31/14 1859    Clinical Impression Statement Today's session mainly consisted of manual therapy with focus on decreasing left biceps tendon pain from recent exacerbation.  Pt noted no pain after manual therapy and decreased tension in her biceps.  Pt's recent exacerbation was likely due to over use on 9/19 from exercising prior to her last session.     Pt will benefit from skilled therapeutic intervention in order to improve on the following deficits Decreased  range of motion;Impaired UE functional use;Postural dysfunction;Decreased strength;Hypomobility;Pain   Rehab Potential Good   PT Frequency 2x / week   PT Duration 4 weeks   PT Treatment/Interventions ADLs/Self Care Home Management;Aquatic Therapy;Biofeedback;Cryotherapy;Electrical Stimulation;Iontophoresis 4mg /ml Dexamethasone;Moist Heat;Therapeutic exercise;Therapeutic activities;Functional mobility training;Traction;Neuromuscular re-education;Patient/family education;Manual techniques;Passive range of motion   PT Next Visit Plan L shoulder strengthening and ROM progression.    Consulted and Agree with Plan of Care Patient        Problem List Patient Active Problem List   Diagnosis Date Noted  . OSA on CPAP 05/06/2014  . Morbid obesity 02/12/2014  . CHF with left ventricular diastolic dysfunction, NYHA class 2 02/10/2014  . Otitis, externa, infective 02/10/2014  . Neuropathic pain, arm 06/19/2011  . Asthma 05/09/2010  . TOBACCO USER 05/06/2010  . COPD with emphysema 05/06/2010   Renford Dills, SPT This entire session was performed under direct supervision and direction of a licensed therapist/therapist assistant . I have personally read, edited and approve of the note as written. Gorden Harms. [name], PT, DPT [phone]  [name],[name] 01/01/2015, 11:46 AM  [place] MAIN [facility] SERVICES [address] [place], [place], [address] Phone: [phone]   Fax:  [phone]

## 2014-12-31 NOTE — Assessment & Plan Note (Addendum)
COPD    Plan-  - O2 2L with minimal exertion and with sleep - Advair 500/50 -severe OSA (AHI=63.8) - cpap 16cm H2O - retired from current job.  - continue with gym/lung work program - overlap syndrome (COPD + OSA) - discussed in detailed with patient - tobacco cessation - still smoking, down to 2 cigs everyother day, - Dymista for 3 days, 1 puff to ea nostril bid.

## 2014-12-31 NOTE — Progress Notes (Signed)
Kelly Hale Consultation      MRN# [id number] Kelly Hale 10-04-1955   CC: Chief Complaint  Patient presents with  . Follow-up    no breathing concerns;       Brief History:12//3/12- 65 yoF smoker followed for chronic bronchitis, tobacco use ROV 03/18/14 47 yoF former smoker (1ppd/ 30 pk yr) with COPD/bronchitis/asthma complicated by tobacco use, CHF [name] Husband here FOLLOWS FOR: chronic respiratory failure, new O2 dependent, also secondary to dCHF. Continues to wear O2 2Lat night through [facility]; states breathing has been doing well since last visit, but has not been wearing O2 much during the day. Upon arrival to the office her O2 sat was noted to be 84% on room air, requiring 2L to get >88%, then upon resting 93% on RA. Would like to have a refill of cough syrup as well. Rare need for Proair. 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 Notes DOE walking house to truck.  Saw cardiology recently (Dr. [name]) who is optimizing her dCHF with BP control and diuresis (Lasix 20mg  daily, may inc to 40mg  for 3lb wt gain or inc leg swelling).  Patient stated that she is on temporary short term disability (works at [name] in the [employer], lifts boxes up to 30lbs during her 8 hr shift), and is supposed to return to work on 03/24/14 when her shot term disability ends, however she is concerned about her ability to perform her current duties. She will require 2L O2 continuously with minimal exertion, and one 15 lb O2 tank will last 3 hours, requiring her to take 2 tanks to work and move around with it, which will be difficult given that she is mobile at work lifting and moving boxes constantly during her shift.  Plan - COPD - on 2L with exertion, advair\prn albuterol, pulm rehab, check split night study  ROV 05/06/2014 Presents today for a follow up visit of her COPD. Since her last visit she  has retired from [employer], and will start a retirement plan shortly. 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  Currently, she has not been wearing her O2 with exertion, only at night, she thought at is what she was supposed to do. Currently she is still smoking 2-4 cigs per day, only wearing nicotine patch 5/7 days Since last visit no ED, urgent or hospitalization for COPD\dyspnea.  Following with cardiology for [facility].  Today upon checkin noted to desat from car to office, saturation was 87%, promptly return to >90% at rest. She has not been to pulm rehab yet, awaiting referral or appointments.  Today would like to discuss the results of her split night study.  Plan-initiate CPAP, stop using tobacco, pulmonary rehabilitation referral, weight loss   ROV 06/2014: Patient presents today for followup visit of her COPD and sleep apnea. She was recently started on CPAP pressure of 16 cm of water, and plaster today show that she's been using it for the last 17 days, 100% compliance, AHI 2.6, average use 8.4 hours per night. Patient states that overall she has increased energy, she feels more awake, does not have as much headaches. Down to smoking 2 cigs per day, still with some stressors at home (sick Uncle). Will start Pulm rehab on 06/25/14. Plan - tobacco cessation, cpap, pulm rehab, O2 at night and with exertion,  start symbicort   ROV 09/2014 Patient presented today for a follow-up visit of her COPD, and OSA. At her last visit she was started on Symbicort, but her insurance would not pay for it She was given Advair 250/50 and advised to take that. Patient thought that she was supposed to be on Advair 500, and has been using Advair 250/50 twice per dose at times. Overall patient states that she is doing well, she is enjoying pulmonary rehabilitation, she walks about 2.5 miles an hour  on the incline machine. She is wearing oxygen 2 L with exertion and at nighttime. She still is smoking about 2-3 cigarettes per day.  Plan-  - O2 2L with minimal exertion and with sleep - Advair 500/50 -severe OSA (AHI=63.8) - cpap 16cm H2O - retired from current job.  - until he would pulmonary rehabilitation - overlap syndrome (COPD + OSA) - discussed in detailed with patient - tobacco cessation - still smoking, down to 2 cigs per day, - Continue with CPAP 16cm H2O with 2L [place] O2 at night.  Events since last clinic visit: Patient presents for COPD follow up today. Has issues with chronic sinusitis, today with sinus congestion, mild wheeze and non productive cough Still smoking, but has cut back to about 2 cigs everyother day, a pack may last 1 month. Attending the gym, 2 times weekly, has completed pulmonary rehab with good results.  Overall doing well.      Medication:   Current Outpatient Rx  Name  Route  Sig  Dispense  Refill  . albuterol (PROVENTIL) (2.5 MG/3ML) 0.083% nebulizer solution   Nebulization   Take 3 mLs (2.5 mg total) by nebulization every 6 (six) hours as needed for wheezing or shortness of breath.   100 vial   11   . amLODipine (NORVASC) 5 MG tablet   Oral   Take 1 tablet by mouth daily.         . Fluticasone-Salmeterol (ADVAIR DISKUS) 500-50 MCG/DOSE AEPB   Inhalation   Inhale 1 puff into the lungs 2 (two) times daily. Rinse mouth after use.   60 each   3   . furosemide (LASIX) 20 MG tablet   Oral   Take 1 tablet (20 mg total) by mouth daily. Patient taking differently: Take 20 mg by mouth every other day.    90 tablet   3   . Multiple Vitamin (MULTIVITAMIN) capsule   Oral   Take 1 capsule by mouth daily.         . OXYGEN   Inhalation   Inhale 2 L into the lungs daily. 2 liters daily         . PROAIR HFA 108 (90 BASE) MCG/ACT inhaler      INHALE 2 PUFFS BY MOUTH 4 TIMES A DAY AS NEEDED      0     Dispense as written.   .  Vitamin D, Cholecalciferol, 1000 UNITS CAPS   Oral   Take 1 capsule by mouth daily.            Review of Systems  Constitutional: Negative for fever, chills, weight loss and malaise/fatigue.  HENT: Positive for congestion. Negative for hearing loss.   Eyes: Negative for blurred vision and double vision.  Respiratory: Positive for cough, sputum production, shortness of breath and wheezing.        Mild wheezing, mild cough and congestion for the past 1 week, slowly improving.   Cardiovascular: Negative for chest pain and  palpitations.  Gastrointestinal: Negative for heartburn and nausea.  Genitourinary: Negative for dysuria.  Musculoskeletal: Negative for myalgias and neck pain.  Skin: Negative for rash.  Neurological: Negative for headaches.  Psychiatric/Behavioral: Negative for depression.      Allergies:  Aspirin  Physical Examination:  VS: BP 130/84 mmHg  Pulse 108  Wt 276 lb (125.193 kg)  SpO2 100%  General Appearance: No distress  HEENT: PERRLA, no ptosis, no other lesions noticed Pulmonary:mild coarse upper airway sounds, mild exp wheeze posteriorly, good respiratory effort.  Cardiovascular:  Normal S1,S2.  No m/r/g.     Abdomen:Exam: Benign, Soft, non-tender, No masses  Skin:   warm, no rashes, no ecchymosis  Extremities: normal, no cyanosis, clubbing, warm with normal capillary refill.      Assessment and Plan: TOBACCO USER Tobacco Cessation - Counseling regarding benefits of smoking cessation strategies was provided for more than 12 min. - Educated that at this time smoking- cessation represents the single most important step that patient can take to enhance the length and quality of live. - Educated patient regarding alternatives of behavior interventions, pharmacotherapy including NRT and non-nicotine therapy such, and combinations of both. - Patient at this time: patient is using nicotine patches, down to 2 cigs everyother day, will continue to keep  weaning, does not want Chantix at this time.       COPD with emphysema COPD    Plan-  - O2 2L with minimal exertion and with sleep - Advair 500/50 -severe OSA (AHI=63.8) - cpap 16cm H2O - retired from current job.  - continue with gym/lung work program - overlap syndrome (COPD + OSA) - discussed in detailed with patient - tobacco cessation - still smoking, down to 2 cigs everyother day, - Dymista for 3 days, 1 puff to ea nostril bid.                Updated Medication List Outpatient Encounter Prescriptions as of 12/31/2014  Medication Sig  . albuterol (PROVENTIL) (2.5 MG/3ML) 0.083% nebulizer solution Take 3 mLs (2.5 mg total) by nebulization every 6 (six) hours as needed for wheezing or shortness of breath.  Marland Kitchen amLODipine (NORVASC) 5 MG tablet Take 1 tablet by mouth daily.  . Fluticasone-Salmeterol (ADVAIR DISKUS) 500-50 MCG/DOSE AEPB Inhale 1 puff into the lungs 2 (two) times daily. Rinse mouth after use.  . furosemide (LASIX) 20 MG tablet Take 1 tablet (20 mg total) by mouth daily. (Patient taking differently: Take 20 mg by mouth every other day. )  . Multiple Vitamin (MULTIVITAMIN) capsule Take 1 capsule by mouth daily.  . OXYGEN Inhale 2 L into the lungs daily. 2 liters daily  . PROAIR HFA 108 (90 BASE) MCG/ACT inhaler INHALE 2 PUFFS BY MOUTH 4 TIMES A DAY AS NEEDED  . Vitamin D, Cholecalciferol, 1000 UNITS CAPS Take 1 capsule by mouth daily.   No facility-administered encounter medications on file as of 12/31/2014.    Orders for this visit: No orders of the defined types were placed in this encounter.    Thank  you for the visitation and for allowing  [facility] to assist in the care of your patient. Our recommendations are noted above.  Please contact us if we can be of further service.  Vilinda Boehringer, MD [facility] Office Number: [phone]

## 2014-12-31 NOTE — Assessment & Plan Note (Signed)
Tobacco Cessation - Counseling regarding benefits of smoking cessation strategies was provided for more than 12 min. - Educated that at this time smoking- cessation represents the single most important step that patient can take to enhance the length and quality of live. - Educated patient regarding alternatives of behavior interventions, pharmacotherapy including NRT and non-nicotine therapy such, and combinations of both. - Patient at this time: patient is using nicotine patches, down to 2 cigs everyother day, will continue to keep weaning, does not want Chantix at this time.

## 2015-01-12 ENCOUNTER — Telehealth: Payer: Self-pay

## 2015-01-12 ENCOUNTER — Telehealth: Payer: Self-pay | Admitting: Internal Medicine

## 2015-01-12 ENCOUNTER — Other Ambulatory Visit: Payer: Self-pay | Admitting: Cardiovascular Disease

## 2015-01-12 MED ORDER — FUROSEMIDE 20 MG PO TABS: 20.0000 mg | ORAL_TABLET | Freq: Two times a day (BID) | ORAL | Status: DC

## 2015-01-12 MED ORDER — ALBUTEROL SULFATE (2.5 MG/3ML) 0.083% IN NEBU: 2.5000 mg | INHALATION_SOLUTION | Freq: Four times a day (QID) | RESPIRATORY_TRACT | Status: DC | PRN

## 2015-01-12 NOTE — Telephone Encounter (Signed)
Pt last seen in Feb, she is overdue for 6 mo f/u, has appt on 02/11/15.

## 2015-01-12 NOTE — Telephone Encounter (Signed)
Patient calling to get refill on Albuterol Nebulizer. Patient last seen 01/01/15 Next OV on recall for 06/2015  Rx sent to pharmacy. Patient notified. Nothing further needed.

## 2015-01-12 NOTE — Telephone Encounter (Signed)
i have ordered it for twice a day 180 pills, 90 day supply

## 2015-01-12 NOTE — Telephone Encounter (Signed)
Pt called, states she is having a problem with ins paying/refilling her Lasix rx . States the pharmacist is requesting Dr. [name] change the wording, states he told her she could take 1-2 a day, and current rx, states only 1 a day, therefore, will not refill. Please call.

## 2015-02-11 ENCOUNTER — Encounter: Payer: Self-pay | Admitting: Cardiovascular Disease

## 2015-02-11 ENCOUNTER — Ambulatory Visit (INDEPENDENT_AMBULATORY_CARE_PROVIDER_SITE_OTHER): Payer: 59 | Admitting: Cardiovascular Disease

## 2015-02-11 VITALS — BP 106/74 | HR 94 | Ht 66.0 in | Wt 273.5 lb

## 2015-02-11 DIAGNOSIS — G4733 Obstructive sleep apnea (adult) (pediatric): Secondary | ICD-10-CM | POA: Diagnosis not present

## 2015-02-11 DIAGNOSIS — I5030 Unspecified diastolic (congestive) heart failure: Secondary | ICD-10-CM

## 2015-02-11 DIAGNOSIS — F17200 Nicotine dependence, unspecified, uncomplicated: Secondary | ICD-10-CM

## 2015-02-11 DIAGNOSIS — J432 Centrilobular emphysema: Secondary | ICD-10-CM

## 2015-02-11 DIAGNOSIS — Z9989 Dependence on other enabling machines and devices: Secondary | ICD-10-CM

## 2015-02-11 NOTE — Assessment & Plan Note (Signed)
We have encouraged her to continue to work on weaning her cigarettes and smoking cessation. She will continue to work on this and does not want any assistance with chantix.  

## 2015-02-11 NOTE — Progress Notes (Signed)
Patient ID: Kelly Hale, female    DOB: [birth year], 59 y.o.   MRN: [id number]  HPI Comments: Kelly Hale is a 59 year old woman with morbid obesity, long smoking history, severe COPD, hypertension, obstructive sleep apnea on CPAP, chronic diastolic CHF, previous hospitalization 12/23/2013 at [facility] for acute respiratory failure with acute on chronicdiastolic CHF. She received antibiotics, prednisone taper, aggressive diuresis with improvement of her symptoms. She presents for routine follow-up of her chronic diastolic CHF  In follow-up, she reports that she is doing well. She takes Lasix 20 mg daily, extra Lasix after lunch for chest tightness On this regimen she has had no problems with diastolic CHF, weight has been stable Seen on a routine basis by pulmonary She is down to 2 cigarettes per day, participating in cardiac rehabilitation. Prior cholesterol in the 230 range, year prior was 128. Unclear why this is not consistent as she has not been on a cholesterol medication  EKG on today's visit shows normal sinus rhythm with rate 94 bpm, no significant ST or T-wave changes  Other past medical history Echocardiogram results reviewed from 12/23/2013 showing normal ejection fraction, diastolic dysfunction, normal right heart size and function, normal right ventricular systolic pressure   Allergies  Allergen Reactions  . Aspirin     REACTION: intol-asthma    Current Outpatient Prescriptions on File Prior to Visit  Medication Sig Dispense Refill  . albuterol (PROVENTIL) (2.5 MG/3ML) 0.083% nebulizer solution Take 3 mLs (2.5 mg total) by nebulization every 6 (six) hours as needed for wheezing or shortness of breath. 100 vial 6  . amLODipine (NORVASC) 5 MG tablet Take 1 tablet by mouth daily.    . Fluticasone-Salmeterol (ADVAIR DISKUS) 500-50 MCG/DOSE AEPB Inhale 1 puff into the lungs 2 (two) times daily. Rinse mouth after use. 60 each 3  . furosemide (LASIX) 20 MG tablet Take 1 tablet (20 mg  total) by mouth 2 (two) times daily. 180 tablet 3  . Multiple Vitamin (MULTIVITAMIN) capsule Take 1 capsule by mouth daily.    . OXYGEN Inhale 2 L into the lungs daily. 2 liters daily    . PROAIR HFA 108 (90 BASE) MCG/ACT inhaler INHALE 2 PUFFS BY MOUTH 4 TIMES A DAY AS NEEDED  0  . Vitamin D, Cholecalciferol, 1000 UNITS CAPS Take 5,000 Units by mouth daily.      No current facility-administered medications on file prior to visit.    Past Medical History  Diagnosis Date  . Asthma   . Hyperlipidemia   . Aspirin allergy   . Morbid obesity ([place])   . Tobacco abuse   . CHF (congestive heart failure) [facility])     Past Surgical History  Procedure Laterality Date  . Cesarean section    . Tubal ligation    . Knee arthroscopy      right    Social History  reports that she has been smoking Cigarettes.  She has a 30 pack-year smoking history. She has never used smokeless tobacco. She reports that she does not drink alcohol or use illicit drugs.  Family History family history includes Alzheimer's disease in her maternal grandmother; Asthma in her mother; Diabetes in her paternal grandfather; Hyperlipidemia in her maternal grandfather and mother; Hypertension in her maternal grandfather, maternal grandmother, and mother; Other in her father, maternal uncle, and mother; Stroke in her maternal grandfather.   Review of Systems  Constitutional: Negative.   Respiratory: Positive for shortness of breath.   Cardiovascular: Negative.   Gastrointestinal: Negative.  Endocrine: Negative.   Musculoskeletal: Negative.   Neurological: Negative.   Psychiatric/Behavioral: Negative.   All other systems reviewed and are negative.   BP 106/74 mmHg  Pulse 94  Ht 5\' 6"  (1.676 m)  Wt 273 lb 8 oz (124.059 kg)  BMI 44.17 kg/m2  Physical Exam  Constitutional: She is oriented to person, place, and time. She appears well-developed and well-nourished.  obese  HENT:  Head: Normocephalic.  Nose: Nose  normal.  Mouth/Throat: Oropharynx is clear and moist.  Eyes: Conjunctivae are normal. Pupils are equal, round, and reactive to light.  Neck: Normal range of motion. Neck supple. No JVD present.  Cardiovascular: Normal rate, regular rhythm, S1 normal, S2 normal, normal heart sounds and intact distal pulses.  Exam reveals no gallop and no friction rub.   No murmur heard. Pulmonary/Chest: Effort normal. No respiratory distress. She has decreased breath sounds. She has wheezes. She has no rales. She exhibits no tenderness.  Abdominal: Soft. Bowel sounds are normal. She exhibits no distension. There is no tenderness.  Musculoskeletal: Normal range of motion. She exhibits no edema or tenderness.  Lymphadenopathy:    She has no cervical adenopathy.  Neurological: She is alert and oriented to person, place, and time. Coordination normal.  Skin: Skin is warm and dry. No rash noted. No erythema.  Psychiatric: She has a normal mood and affect. Her behavior is normal. Judgment and thought content normal.    Assessment and Plan  Nursing note and vitals reviewed.

## 2015-02-11 NOTE — Patient Instructions (Signed)
You are doing well. No medication changes were made.  Ask PMD about checking cholesterol, fasting  Please call us if you have new issues that need to be addressed before your next appt.  Your physician wants you to follow-up in: 6 months.  You will receive a reminder letter in the mail two months in advance. If you don't receive a letter, please call our office to schedule the follow-up appointment.

## 2015-02-11 NOTE — Assessment & Plan Note (Signed)
Tolerating CPAP, compliant

## 2015-02-11 NOTE — Assessment & Plan Note (Signed)
No recent episodes of COPD exacerbation. On chronic oxygen, stable

## 2015-02-11 NOTE — Assessment & Plan Note (Signed)
We have encouraged continued exercise, careful diet management in an effort to lose weight. 

## 2015-02-11 NOTE — Assessment & Plan Note (Signed)
She has done very well on her current medication regimen, she has been compliant on her Lasix. Weight stable. No medication changes made

## 2015-03-04 ENCOUNTER — Telehealth: Payer: Self-pay | Admitting: Internal Medicine

## 2015-03-04 MED ORDER — FLUTICASONE-SALMETEROL 500-50 MCG/DOSE IN AEPB: 1.0000 | INHALATION_SPRAY | Freq: Two times a day (BID) | RESPIRATORY_TRACT | Status: DC

## 2015-03-04 NOTE — Telephone Encounter (Signed)
Spoke with [facility]. Was advised need pt advair refilled for 90 day supply. I have sent this in. Nothing further needed

## 2015-03-12 ENCOUNTER — Emergency Department: Payer: 59

## 2015-03-12 ENCOUNTER — Inpatient Hospital Stay: Payer: 59

## 2015-03-12 ENCOUNTER — Inpatient Hospital Stay
Admission: EM | Admit: 2015-03-12 | Discharge: 2015-03-14 | DRG: 871 | Disposition: A | Discharge status: Home / Self Care | Payer: 59 | Attending: Internal Medicine | Admitting: Internal Medicine

## 2015-03-12 ENCOUNTER — Encounter: Payer: Self-pay | Admitting: Emergency Medicine

## 2015-03-12 DIAGNOSIS — Z802 Family history of malignant neoplasm of other respiratory and intrathoracic organs: Secondary | ICD-10-CM | POA: Diagnosis not present

## 2015-03-12 DIAGNOSIS — Z8249 Family history of ischemic heart disease and other diseases of the circulatory system: Secondary | ICD-10-CM

## 2015-03-12 DIAGNOSIS — J45909 Unspecified asthma, uncomplicated: Secondary | ICD-10-CM | POA: Diagnosis present

## 2015-03-12 DIAGNOSIS — T380X5A Adverse effect of glucocorticoids and synthetic analogues, initial encounter: Secondary | ICD-10-CM | POA: Diagnosis present

## 2015-03-12 DIAGNOSIS — Z716 Tobacco abuse counseling: Secondary | ICD-10-CM

## 2015-03-12 DIAGNOSIS — A419 Sepsis, unspecified organism: Secondary | ICD-10-CM | POA: Diagnosis not present

## 2015-03-12 DIAGNOSIS — K529 Noninfective gastroenteritis and colitis, unspecified: Secondary | ICD-10-CM | POA: Diagnosis present

## 2015-03-12 DIAGNOSIS — J441 Chronic obstructive pulmonary disease with (acute) exacerbation: Secondary | ICD-10-CM | POA: Diagnosis present

## 2015-03-12 DIAGNOSIS — F17210 Nicotine dependence, cigarettes, uncomplicated: Secondary | ICD-10-CM | POA: Diagnosis present

## 2015-03-12 DIAGNOSIS — I10 Essential (primary) hypertension: Secondary | ICD-10-CM | POA: Diagnosis present

## 2015-03-12 DIAGNOSIS — Z9889 Other specified postprocedural states: Secondary | ICD-10-CM

## 2015-03-12 DIAGNOSIS — Z823 Family history of stroke: Secondary | ICD-10-CM | POA: Diagnosis not present

## 2015-03-12 DIAGNOSIS — Z886 Allergy status to analgesic agent status: Secondary | ICD-10-CM | POA: Diagnosis not present

## 2015-03-12 DIAGNOSIS — I248 Other forms of acute ischemic heart disease: Secondary | ICD-10-CM | POA: Diagnosis present

## 2015-03-12 DIAGNOSIS — J189 Pneumonia, unspecified organism: Secondary | ICD-10-CM | POA: Diagnosis present

## 2015-03-12 DIAGNOSIS — Z820 Family history of epilepsy and other diseases of the nervous system: Secondary | ICD-10-CM

## 2015-03-12 DIAGNOSIS — J9601 Acute respiratory failure with hypoxia: Secondary | ICD-10-CM | POA: Diagnosis present

## 2015-03-12 DIAGNOSIS — R112 Nausea with vomiting, unspecified: Secondary | ICD-10-CM

## 2015-03-12 DIAGNOSIS — R778 Other specified abnormalities of plasma proteins: Secondary | ICD-10-CM

## 2015-03-12 DIAGNOSIS — R7989 Other specified abnormal findings of blood chemistry: Secondary | ICD-10-CM

## 2015-03-12 DIAGNOSIS — Z9981 Dependence on supplemental oxygen: Secondary | ICD-10-CM

## 2015-03-12 DIAGNOSIS — Z833 Family history of diabetes mellitus: Secondary | ICD-10-CM | POA: Diagnosis not present

## 2015-03-12 DIAGNOSIS — J440 Chronic obstructive pulmonary disease with acute lower respiratory infection: Secondary | ICD-10-CM | POA: Diagnosis present

## 2015-03-12 DIAGNOSIS — Z7982 Long term (current) use of aspirin: Secondary | ICD-10-CM

## 2015-03-12 DIAGNOSIS — G4733 Obstructive sleep apnea (adult) (pediatric): Secondary | ICD-10-CM | POA: Diagnosis present

## 2015-03-12 DIAGNOSIS — Z9851 Tubal ligation status: Secondary | ICD-10-CM | POA: Diagnosis not present

## 2015-03-12 DIAGNOSIS — E785 Hyperlipidemia, unspecified: Secondary | ICD-10-CM | POA: Diagnosis present

## 2015-03-12 DIAGNOSIS — Z79899 Other long term (current) drug therapy: Secondary | ICD-10-CM | POA: Diagnosis not present

## 2015-03-12 DIAGNOSIS — I5032 Chronic diastolic (congestive) heart failure: Secondary | ICD-10-CM | POA: Diagnosis present

## 2015-03-12 DIAGNOSIS — R197 Diarrhea, unspecified: Secondary | ICD-10-CM

## 2015-03-12 DIAGNOSIS — Z825 Family history of asthma and other chronic lower respiratory diseases: Secondary | ICD-10-CM

## 2015-03-12 HISTORY — DX: Chronic obstructive pulmonary disease, unspecified: J44.9

## 2015-03-12 LAB — TROPONIN I: Troponin I: 0.12 ng/mL — ABNORMAL HIGH (ref <0.031)

## 2015-03-12 LAB — LIPASE, BLOOD: Lipase: 14 U/L (ref 11–51)

## 2015-03-12 LAB — HEPATIC FUNCTION PANEL
ALBUMIN: 3.1 g/dL — AB (ref 3.5–5.0)
ALK PHOS: 48 U/L (ref 38–126)
ALT: 14 U/L (ref 14–54)
AST: 19 U/L (ref 15–41)
Bilirubin, Direct: 0.3 mg/dL (ref 0.1–0.5)
Indirect Bilirubin: 0.9 mg/dL (ref 0.3–0.9)
TOTAL PROTEIN: 7.3 g/dL (ref 6.5–8.1)
Total Bilirubin: 1.2 mg/dL (ref 0.3–1.2)

## 2015-03-12 LAB — BASIC METABOLIC PANEL
Anion gap: 11 (ref 5–15)
BUN: 19 mg/dL (ref 6–20)
CALCIUM: 8.9 mg/dL (ref 8.9–10.3)
CHLORIDE: 96 mmol/L — AB (ref 101–111)
CO2: 30 mmol/L (ref 22–32)
CREATININE: 0.67 mg/dL (ref 0.44–1.00)
GFR calc Af Amer: >60 mL/min (ref >60)
GFR calc non Af Amer: >60 mL/min (ref >60)
Glucose, Bld: 123 mg/dL — ABNORMAL HIGH (ref 65–99)
Potassium: 3.6 mmol/L (ref 3.5–5.1)
SODIUM: 137 mmol/L (ref 135–145)

## 2015-03-12 LAB — LACTIC ACID, PLASMA: Lactic Acid, Venous: 1.2 mmol/L (ref 0.5–2.0)

## 2015-03-12 LAB — CBC
HCT: 44.8 % (ref 35.0–47.0)
Hemoglobin: 14.6 g/dL (ref 12.0–16.0)
MCH: 29.9 pg (ref 26.0–34.0)
MCHC: 32.5 g/dL (ref 32.0–36.0)
MCV: 92 fL (ref 80.0–100.0)
PLATELETS: 274 10*3/uL (ref 150–440)
RBC: 4.87 MIL/uL (ref 3.80–5.20)
RDW: 15 % — AB (ref 11.5–14.5)
WBC: 31.3 10*3/uL — ABNORMAL HIGH (ref 3.6–11.0)

## 2015-03-12 LAB — PROTIME-INR
INR: 1.28
PROTHROMBIN TIME: 16.1 s — AB (ref 11.4–15.0)

## 2015-03-12 LAB — C DIFFICILE QUICK SCREEN W PCR REFLEX
C DIFFICILE (CDIFF) INTERP: NEGATIVE
C DIFFICILE (CDIFF) TOXIN: NEGATIVE
C Diff antigen: NEGATIVE

## 2015-03-12 LAB — APTT: APTT: 33 s (ref 24–36)

## 2015-03-12 LAB — BRAIN NATRIURETIC PEPTIDE: B NATRIURETIC PEPTIDE 5: 163 pg/mL — AB (ref 0.0–100.0)

## 2015-03-12 MED ORDER — MOMETASONE FURO-FORMOTEROL FUM 200-5 MCG/ACT IN AERO
2.0000 | INHALATION_SPRAY | Freq: Two times a day (BID) | RESPIRATORY_TRACT | Status: DC
  Administered 2015-03-13 – 2015-03-14 (×2): 2 via RESPIRATORY_TRACT
  Filled 2015-03-12: qty 8.8

## 2015-03-12 MED ORDER — METOPROLOL TARTRATE 25 MG PO TABS
12.5000 mg | ORAL_TABLET | Freq: Four times a day (QID) | ORAL | Status: DC
  Administered 2015-03-12 – 2015-03-14 (×6): 12.5 mg via ORAL
  Filled 2015-03-12 (×6): qty 1

## 2015-03-12 MED ORDER — NICOTINE POLACRILEX 2 MG MT GUM
2.0000 mg | CHEWING_GUM | OROMUCOSAL | Status: DC | PRN
  Filled 2015-03-12: qty 1

## 2015-03-12 MED ORDER — DEXTROSE 5 % IV SOLN
1.0000 g | Freq: Once | INTRAVENOUS | Status: AC
  Administered 2015-03-12: 1 g via INTRAVENOUS
  Filled 2015-03-12: qty 10

## 2015-03-12 MED ORDER — SODIUM CHLORIDE 0.9 % IV BOLUS (SEPSIS)
1000.0000 mL | INTRAVENOUS | Status: AC
  Administered 2015-03-12: 1000 mL via INTRAVENOUS

## 2015-03-12 MED ORDER — ALBUTEROL SULFATE (2.5 MG/3ML) 0.083% IN NEBU
2.5000 mg | INHALATION_SOLUTION | Freq: Four times a day (QID) | RESPIRATORY_TRACT | Status: DC | PRN
  Administered 2015-03-12: 2.5 mg via RESPIRATORY_TRACT
  Filled 2015-03-12: qty 3

## 2015-03-12 MED ORDER — ASPIRIN EC 81 MG PO TBEC
81.0000 mg | DELAYED_RELEASE_TABLET | Freq: Every day | ORAL | Status: DC
Start: 2015-03-12 — End: 2015-03-14
  Administered 2015-03-13 – 2015-03-14 (×2): 81 mg via ORAL
  Filled 2015-03-12 (×2): qty 1

## 2015-03-12 MED ORDER — NICOTINE 21 MG/24HR TD PT24
21.0000 mg | MEDICATED_PATCH | Freq: Every day | TRANSDERMAL | Status: DC
  Filled 2015-03-12: qty 1

## 2015-03-12 MED ORDER — ONDANSETRON HCL 4 MG/2ML IJ SOLN
4.0000 mg | Freq: Once | INTRAMUSCULAR | Status: AC
  Administered 2015-03-12: 4 mg via INTRAVENOUS

## 2015-03-12 MED ORDER — METHYLPREDNISOLONE SODIUM SUCC 125 MG IJ SOLR: 125.0000 mg | Freq: Once | INTRAMUSCULAR | Status: DC

## 2015-03-12 MED ORDER — ENOXAPARIN SODIUM 40 MG/0.4ML ~~LOC~~ SOLN
40.0000 mg | SUBCUTANEOUS | Status: DC
  Administered 2015-03-12: 40 mg via SUBCUTANEOUS
  Filled 2015-03-12: qty 0.4

## 2015-03-12 MED ORDER — DEXTROSE 5 % IV SOLN
500.0000 mg | INTRAVENOUS | Status: DC
  Administered 2015-03-13: 500 mg via INTRAVENOUS
  Filled 2015-03-12 (×2): qty 500

## 2015-03-12 MED ORDER — POTASSIUM CHLORIDE IN NACL 20-0.9 MEQ/L-% IV SOLN
INTRAVENOUS | Status: DC
  Administered 2015-03-12 – 2015-03-13 (×3): via INTRAVENOUS
  Filled 2015-03-12 (×5): qty 1000

## 2015-03-12 MED ORDER — ACETAMINOPHEN 325 MG PO TABS: 650.0000 mg | ORAL_TABLET | Freq: Four times a day (QID) | ORAL | Status: DC | PRN

## 2015-03-12 MED ORDER — IPRATROPIUM-ALBUTEROL 0.5-2.5 (3) MG/3ML IN SOLN
3.0000 mL | Freq: Once | RESPIRATORY_TRACT | Status: AC
  Administered 2015-03-12: 3 mL via RESPIRATORY_TRACT
  Filled 2015-03-12: qty 3

## 2015-03-12 MED ORDER — ACETAMINOPHEN 650 MG RE SUPP: 650.0000 mg | Freq: Four times a day (QID) | RECTAL | Status: DC | PRN

## 2015-03-12 MED ORDER — DEXTROSE 5 % IV SOLN
1.0000 g | INTRAVENOUS | Status: DC
  Administered 2015-03-13: 1 g via INTRAVENOUS
  Filled 2015-03-12 (×2): qty 10

## 2015-03-12 MED ORDER — PANTOPRAZOLE SODIUM 40 MG IV SOLR
40.0000 mg | Freq: Two times a day (BID) | INTRAVENOUS | Status: DC
  Administered 2015-03-12 – 2015-03-14 (×4): 40 mg via INTRAVENOUS
  Filled 2015-03-12 (×4): qty 40

## 2015-03-12 MED ORDER — AZITHROMYCIN 500 MG IV SOLR
500.0000 mg | Freq: Once | INTRAVENOUS | Status: AC
  Administered 2015-03-12: 500 mg via INTRAVENOUS
  Filled 2015-03-12: qty 500

## 2015-03-12 MED ORDER — SODIUM CHLORIDE 0.9 % IJ SOLN
3.0000 mL | Freq: Two times a day (BID) | INTRAMUSCULAR | Status: DC
  Administered 2015-03-12 – 2015-03-14 (×4): 3 mL via INTRAVENOUS

## 2015-03-12 MED ORDER — ALBUTEROL SULFATE (2.5 MG/3ML) 0.083% IN NEBU
5.0000 mg | INHALATION_SOLUTION | Freq: Once | RESPIRATORY_TRACT | Status: AC
  Administered 2015-03-12: 5 mg via RESPIRATORY_TRACT
  Filled 2015-03-12: qty 6

## 2015-03-12 MED ORDER — ONDANSETRON HCL 4 MG/2ML IJ SOLN
INTRAMUSCULAR | Status: AC
  Administered 2015-03-12: 4 mg via INTRAVENOUS
  Filled 2015-03-12: qty 2

## 2015-03-12 NOTE — H&P (Addendum)
[facility] NAME: Kelly Hale    MR#:  [id number]  DATE OF BIRTH:  02/04/1956  DATE OF ADMISSION:  03/12/2015  PRIMARY CARE PHYSICIAN: Vidal Schwalbe, MD   REQUESTING/REFERRING PHYSICIAN:   CHIEF COMPLAINT:   Chief Complaint  Patient presents with  . Emesis  . Diarrhea  . Cough    HISTORY OF PRESENT ILLNESS: Kelly Hale  is a 59 y.o. female with a known history of COPD and ongoing tobacco abuse who presents to the hospital with complaints of cough, wheezing, shortness of breath, yellow phlegm production for the past 3 days. Patient is also complaining of nausea, vomiting and diarrhea over the past 3 days. She admits of having some black stool but denies any hematemesis or bright red blood per rectum. She feels feverish and chilly. Her temperature is around 99 she has some chest congestion and has difficulty lifting up phlegm, she admits of using few pillows to sleep, but that it has been chronic. Denies any lower extremity swelling. Admits of using oxygen more frequently at daytime over the past few days, admits of of pain in the lower chest posteriorly with cough paroxysms. On arrival to the hospital, she was noted to be somewhat hypoxic with O2 sats of 90% on 2 L of oxygen through nasal cannula. She was also tachycardic with heart rate was of 110. Labs revealed leukocytosis of about 31,000, mild to elevation of troponin of 0.12. Normal lactic acid level of 1.2. Chest x-ray revealed right lower lobe infiltrate. Hospitalist services were contacted for admission  PAST MEDICAL HISTORY:   Past Medical History  Diagnosis Date  . Asthma   . Hyperlipidemia   . Aspirin allergy   . Morbid obesity ([place])   . Tobacco abuse   . CHF (congestive heart failure) ([place])   . COPD (chronic obstructive pulmonary disease) (HCC)     oxygen at home as needed (& at night)    PAST SURGICAL HISTORY:  Past Surgical History  Procedure Laterality  Date  . Cesarean section    . Tubal ligation    . Knee arthroscopy      right    SOCIAL HISTORY:  Social History  Substance Use Topics  . Smoking status: Light Tobacco Smoker -- 0.00 packs/day for 30 years    Types: Cigarettes  . Smokeless tobacco: Never Used     Comment: quit in Sept 2015, but started back smoking up to 2 cigs per day  . Alcohol Use: No    FAMILY HISTORY:  Family History  Problem Relation Age of Onset  . Other Father     MVA  . Hypertension Mother   . Hyperlipidemia Mother   . Asthma Mother   . Other Mother     heart valve replaced  . Diabetes Paternal Grandfather   . Hypertension Maternal Grandfather   . Hyperlipidemia Maternal Grandfather   . Stroke Maternal Grandfather   . Hypertension Maternal Grandmother   . Alzheimer's disease Maternal Grandmother   . Other Maternal Uncle     laryngeal cancer    DRUG ALLERGIES:  Allergies  Allergen Reactions  . Aspirin Other (See Comments)    REACTION: intol-asthma Patient takes low dose of aspirin.    Review of Systems  Constitutional: Positive for fever, chills and malaise/fatigue. Negative for weight loss.  HENT: Positive for congestion.   Eyes: Negative for blurred vision and double vision.  Respiratory: Positive for cough, sputum production, shortness  of breath and wheezing.   Cardiovascular: Positive for chest pain and orthopnea. Negative for palpitations, leg swelling and PND.  Gastrointestinal: Positive for nausea, vomiting and diarrhea. Negative for abdominal pain, constipation, blood in stool and melena.  Genitourinary: Negative for dysuria, urgency, frequency and hematuria.  Musculoskeletal: Negative for falls.  Skin: Negative for rash.  Neurological: Positive for weakness. Negative for dizziness.  Psychiatric/Behavioral: Negative for depression and memory loss. The patient is not nervous/anxious.     MEDICATIONS AT HOME:  Prior to Admission medications   Medication Sig Start Date End  Date Taking? Authorizing Provider  albuterol (PROVENTIL HFA;VENTOLIN HFA) 108 (90 BASE) MCG/ACT inhaler Inhale 1-2 puffs into the lungs every 6 (six) hours as needed for wheezing or shortness of breath.   Yes Historical Provider, MD  albuterol (PROVENTIL) (2.5 MG/3ML) 0.083% nebulizer solution Take 3 mLs (2.5 mg total) by nebulization every 6 (six) hours as needed for wheezing or shortness of breath. 01/12/15  Yes Vishal Mungal, MD  amLODipine (NORVASC) 5 MG tablet Take 1 tablet by mouth daily.   Yes Historical Provider, MD  aspirin EC 81 MG tablet Take 81 mg by mouth daily.   Yes Historical Provider, MD  Fluticasone-Salmeterol (ADVAIR DISKUS) 500-50 MCG/DOSE AEPB Inhale 1 puff into the lungs 2 (two) times daily. Rinse mouth after use. 03/04/15  Yes Vishal Mungal, MD  furosemide (LASIX) 20 MG tablet Take 1 tablet (20 mg total) by mouth 2 (two) times daily. 01/12/15  Yes Minna Merritts, MD  Multiple Vitamin (MULTIVITAMIN) capsule Take 1 capsule by mouth daily.   Yes Historical Provider, MD  OXYGEN Inhale 2 L into the lungs daily. 2 liters daily   Yes Historical Provider, MD  Vitamin D, Cholecalciferol, 1000 UNITS CAPS Take 1,000 Units by mouth daily.    Yes Historical Provider, MD      PHYSICAL EXAMINATION:   VITAL SIGNS: Blood pressure 107/57, pulse 98, temperature 99.1 F (37.3 C), temperature source Oral, resp. rate 28, height 5\' 7"  (1.702 m), weight 122.018 kg (269 lb), SpO2 91 %.  GENERAL:  59 y.o.-year-old patient lying in the bed in mild-to-moderate respiratory distress, pale and diaphoretic.  EYES: Pupils equal, round, reactive to light and accommodation. No scleral icterus. Extraocular muscles intact.  HEENT: Head atraumatic, normocephalic. Oropharynx and nasopharynx clear.  NECK:  Supple, no jugular venous distention. No thyroid enlargement, no tenderness.  LUNGS: Diminished breath sounds bilaterally at bases, prolonged expiratory phase with some wheezing at bases. Bilateral,  rales,rhonchi anteriorly , no significant crepitations noted. Intermittent use of accessory muscles of respiration, especially with exertion.  CARDIOVASCULAR: S1, S2 , tachycardic. No murmurs, rubs, or gallops.  ABDOMEN: Soft, nontender, nondistended. Bowel sounds present. No organomegaly or mass.  EXTREMITIES: No pedal edema, cyanosis, or clubbing.  NEUROLOGIC: Cranial nerves II through XII are intact. Muscle strength 5/5 in all extremities. Sensation intact. Gait not checked.  PSYCHIATRIC: The patient is alert and oriented x 3.  SKIN: No obvious rash, lesion, or ulcer.   LABORATORY PANEL:   CBC  Recent Labs Lab 03/12/15 1202  WBC 31.3*  HGB 14.6  HCT 44.8  PLT 274  MCV 92.0  MCH 29.9  MCHC 32.5  RDW 15.0*   ------------------------------------------------------------------------------------------------------------------  Chemistries   Recent Labs Lab 03/12/15 1202  NA 137  K 3.6  CL 96*  CO2 30  GLUCOSE 123*  BUN 19  CREATININE 0.67  CALCIUM 8.9  AST 19  ALT 14  ALKPHOS 48  BILITOT 1.2   ------------------------------------------------------------------------------------------------------------------  Cardiac Enzymes  Recent Labs Lab 03/12/15 1202  TROPONINI 0.12*   ------------------------------------------------------------------------------------------------------------------  RADIOLOGY: Dg Chest 2 View  03/12/2015  CLINICAL DATA:  Patient presents to the ED with diarrhea, vomiting and cough x 3 weeks. Patient reports diarrhea x 2 and vomiting x 3 in the past 24 hours. Patient reports cough and congestion as well. Patient has COPD and CHF and is on oxygen. EXAM: CHEST  2 VIEW COMPARISON:  12/23/2013 FINDINGS: Heart size is normal. Right lower lobe infiltrate is present, obscuring the posterior right hemidiaphragm. No pulmonary edema. Note is made of S shaped thoracolumbar scoliosis. IMPRESSION: Right lower lobe infiltrate. Electronically Signed   By:  Nolon Nations M.D.   On: 03/12/2015 13:14    EKG: Orders placed or performed during the hospital encounter of 03/12/15  . ED EKG  . ED EKG  . EKG 12-Lead  . EKG 12-Lead    IMPRESSION AND PLAN:  Principal Problem:   Sepsis ([place]) Active Problems:   COPD exacerbation ([place])   Community acquired pneumonia   Elevated troponin 1. Sepsis due to pneumonia. Admit patient medical floor, get blood cultures as well as sputum cultures if possible, initiate patient on Rocephin and Zithromax 2. Severe exacerbation, start patient on steroids. Continue inhalation therapy, Follow clinically 3. Right lower lobe pneumonia, so patient on Rocephin and Zithromax. Initiate Humibid. Get sputum cultures 4. Acute gastroenteritis. Stool cultures for C. difficile and common enteric pathogens, get abdominal x-rays. Initiate patient on PPIs 5. Leukocytosis. Follow with therapy 6.   elevated troponin, likely demand ischemia. Initiate patient on aspirin therapy , low dose of metoprolol low-dose of metoprolol, get cardiologist involved for further recommendations. Follow cardiac enzymes 3, ,  7. Tobacco abuse. Counseling. Discussed this patient for approximately 3-4 minutes. Nicotine replacement therapy will be initiated for which patient is agreeable  All the records are reviewed and case discussed with ED provider. Management plans discussed with the patient, family and they are in agreement.  CODE STATUS:    TOTAL TIME TAKING CARE OF THIS PATIEnt 60 minutes.    Theodoro Grist M.D on 03/12/2015 at 2:27 PM  Between 7am to 6pm - Pager - [phone] After 6pm go to [email or web address] - password [email or web address] [facility]  Office  [phone]  CC: Primary care physician; Vidal Schwalbe, MD

## 2015-03-12 NOTE — ED Notes (Signed)
Patient presents to the ED with diarrhea, vomiting and cough x 3 weeks.  Patient reports diarrhea x 2 and vomiting x 3 in the past 24 hours.  Patient reports cough and congestion as well.  Patient has COPD and CHF and is on oxygen as needed.  Patient is on 2L in triage that she brought from home.

## 2015-03-12 NOTE — Consult Note (Signed)
ANTIBIOTIC CONSULT NOTE - INITIAL  Pharmacy Consult for azithromycin and ceftriaxone Indication: pneumonia  Allergies  Allergen Reactions  . Aspirin Other (See Comments)    REACTION: intol-asthma Patient takes low dose of aspirin.    Patient Measurements: Height: 5\' 7"  (170.2 cm) Weight: 269 lb (122.018 kg) IBW/kg (Calculated) : 61.6 Adjusted Body Weight:   Vital Signs: Temp: 99.1 F (37.3 C) (12/01 1152) Temp Source: Oral (12/01 1152) BP: 107/57 mmHg (12/01 1400) Pulse Rate: 98 (12/01 1400) Intake/Output from previous day:   Intake/Output from this shift:    Labs:  Recent Labs  03/12/15 1202  WBC 31.3*  HGB 14.6  PLT 274  CREATININE 0.67   Estimated Creatinine Clearance: 102.6 mL/min (by C-G formula based on Cr of 0.67). No results for input(s): VANCOTROUGH, VANCOPEAK, VANCORANDOM, GENTTROUGH, GENTPEAK, GENTRANDOM, TOBRATROUGH, TOBRAPEAK, TOBRARND, AMIKACINPEAK, AMIKACINTROU, AMIKACIN in the last 72 hours.   Microbiology: No results found for this or any previous visit (from the past 720 hour(s)).  Medical History: Past Medical History  Diagnosis Date  . Asthma   . Hyperlipidemia   . Aspirin allergy   . Morbid obesity ([place])   . Tobacco abuse   . CHF (congestive heart failure) ([place])   . COPD (chronic obstructive pulmonary disease) (H[facility])     oxygen at home as needed (& at night)    Medications:  Scheduled:  . aspirin  81 mg Oral Daily  . enoxaparin (LOVENOX) injection  40 mg Subcutaneous Q24H  . metoprolol tartrate  12.5 mg Oral Q6H  . mometasone-formoterol  2 puff Inhalation BID  . pantoprazole (PROTONIX) IV  40 mg Intravenous Q12H  . sodium chloride  3 mL Intravenous Q12H   Assessment: [name] a 59 year old female with a hx of COPD, presents with right lower lobe PNA, cough, SOB, leukocytosis. Pharmacy consulted to dose azithromycin and ceftriaxone for CAP  Goal of Therapy:  resolution of infection  Plan:  Follow up culture  results Azithromycin 500mg  IV q 24 hours and ceftriaxone 1g q 24 hours. pharmacy to continue to monitor. thank you for the consult  Kelly Hale 03/12/2015,4:12 PM

## 2015-03-12 NOTE — ED Provider Notes (Signed)
[facility] Emergency Department Provider Note  ____________________________________________  Time seen: Approximately 12:54 PM  I have reviewed the triage vital signs and the nursing notes.   HISTORY  Chief Complaint Emesis; Diarrhea; and Cough    HPI Kelly Hale is a 59 y.o. female who has a past medical history that includes morbid obesity, ongoing tobacco abuse, COPD with oxygen at home as needed, CHF, and hyperlipidemia who presents with about 3 weeks of gradual onset but slowly worsening shortness of breath and cough.  Over the last 2-3 days she has also had gradual onset of several episodes of vomiting and diarrhea, reporting episodes of emesis and 2 episodes of loose stool in the last 24 hours.  Just reports some ongoing nasal congestion, by today she describes her symptoms as severe and she is using her usual when necessary oxygen all the time.  She was satting 90% on 2 L when she arrived in triage.  She has had some subjective low-grade fevers.  She denies abdominal pain and chest pain.  She has been using her usual inhalers but they do not make her symptoms better.  Her symptoms get worse with exertion.   Past Medical History  Diagnosis Date  . Asthma   . Hyperlipidemia   . Aspirin allergy   . Morbid obesity ([name])   . Tobacco abuse   . CHF (congestive heart failure) ([name])   . COPD (chronic obstructive pulmonary disease) (H[facility])     oxygen at home as needed (& at night)    Patient Active Problem List   Diagnosis Date Noted  . Sepsis ([name]) 03/12/2015  . COPD exacerbation ([name]) 03/12/2015  . Community acquired pneumonia 03/12/2015  . Elevated troponin 03/12/2015  . OSA on CPAP 05/06/2014  . Morbid obesity ([name]) 02/12/2014  . CHF with left ventricular diastolic dysfunction, NYHA class 2 ([name]) 02/10/2014  . Otitis, externa, infective 02/10/2014  . Neuropathic pain, arm 06/19/2011  . Asthma 05/09/2010  . TOBACCO USER 05/06/2010  . 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 Cesarean section    . Tubal ligation    . Knee arthroscopy      right    No current outpatient prescriptions on file.  Allergies Aspirin  Family History  Problem Relation Age of Onset  . Other Father     MVA  . Hypertension Mother   . Hyperlipidemia Mother   . Asthma Mother   . Other Mother     heart valve replaced  . Diabetes Paternal Grandfather   . Hypertension Maternal Grandfather   . Hyperlipidemia Maternal Grandfather   . Stroke Maternal Grandfather   . Hypertension Maternal Grandmother   . Alzheimer's disease Maternal Grandmother   . Other Maternal Uncle     laryngeal cancer    Social History Social History  Substance Use Topics  . Smoking status: Light Tobacco Smoker -- 0.00 packs/day for 30 years    Types: Cigarettes  . Smokeless tobacco: Never Used     Comment: quit in Sept 2015, but started back smoking up to 2 cigs per day  . Alcohol Use: No    Review of Systems Constitutional: Subjective fever Eyes: No visual changes. ENT: No sore throat. Cardiovascular: Denies chest pain. Respiratory: Gradual worsening shortness of breath especially with exertion Gastrointestinal: No abdominal pain.  Several episodes of vomiting and diarrhea over the last several days. Genitourinary: Negative for dysuria. Musculoskeletal: Negative for back  pain. Skin: Negative for rash. Neurological: Negative for headaches, focal weakness or numbness.  10-point ROS otherwise negative.  ____________________________________________   PHYSICAL EXAM:  VITAL SIGNS: ED Triage Vitals  Enc Vitals Group     BP 03/12/15 1152 115/60 mmHg     Pulse Rate 03/12/15 1152 110     Resp 03/12/15 1152 24     Temp 03/12/15 1152 99.1 F (37.3 C)     Temp Source 03/12/15 1152 Oral     SpO2 03/12/15 1152 90 %     Weight 03/12/15 1152 269 lb (122.018 kg)     Height 03/12/15 1152 5\' 7"  (1.702 m)     Head  Cir --      Peak Flow --      Pain Score 03/12/15 1153 7     Pain Loc --      Pain Edu? --      Excl. in [place]? --     Constitutional: Alert and oriented.  Has the appearance of chronic illness but is in only mild respiratory distress at this time. Eyes: Conjunctivae are normal. PERRL. EOMI. Head: Atraumatic. Nose: No congestion/rhinnorhea. Mouth/Throat: Mucous membranes are dry.  Oropharynx non-erythematous. Neck: No stridor.   Cardiovascular: Tachycardia, regular rhythm. Grossly normal heart sounds.  Good peripheral circulation. Respiratory: Slightly increased respiratory effort with increased rate and mild retractions.  End expiratory wheezes most notable in the bases. Gastrointestinal: Morbid obesity, Soft and nontender. No distention. No abdominal bruits. No CVA tenderness. Musculoskeletal: No lower extremity tenderness nor edema.  No joint effusions. Neurologic:  Normal speech and language. No gross focal neurologic deficits are appreciated.  Skin:  Skin is warm, dry and intact. No rash noted. Psychiatric: Mood and affect are normal. Speech and behavior are normal.  ____________________________________________   LABS (all labs ordered are listed, but only abnormal results are displayed)  Labs Reviewed  BASIC METABOLIC PANEL - Abnormal; Notable for the following:    Chloride 96 (*)    Glucose, Bld 123 (*)    All other components within normal limits  CBC - Abnormal; Notable for the following:    WBC 31.3 (*)    RDW 15.0 (*)    All other components within normal limits  TROPONIN I - Abnormal; Notable for the following:    Troponin I 0.12 (*)    All other components within normal limits  HEPATIC FUNCTION PANEL - Abnormal; Notable for the following:    Albumin 3.1 (*)    All other components within normal limits  BRAIN NATRIURETIC PEPTIDE - Abnormal; Notable for the following:    B Natriuretic Peptide 163.0 (*)    All other components within normal limits  PROTIME-INR -  Abnormal; Notable for the following:    Prothrombin Time 16.1 (*)    All other components within normal limits  C DIFFICILE QUICK SCREEN W PCR REFLEX  CULTURE, BLOOD (ROUTINE X 2)  CULTURE, BLOOD (ROUTINE X 2)  URINE CULTURE  STOOL CULTURE  CULTURE, EXPECTORATED SPUTUM-ASSESSMENT  LIPASE, BLOOD  LACTIC ACID, PLASMA  APTT  LACTIC ACID, PLASMA  URINALYSIS COMPLETEWITH MICROSCOPIC ([facility] ONLY)  TROPONIN I  TROPONIN I  TROPONIN I   ____________________________________________  EKG  ED ECG REPORT I, Soleil Mas, the attending physician, personally viewed and interpreted this ECG.  Date: 03/12/2015 EKG Time: 12:03 Rate: 106 Rhythm: normal sinus rhythm QRS Axis: normal Intervals: normal ST/T Wave abnormalities: non-specific Conduction Disutrbances: none Narrative Interpretation: Non-specific ST segment / T-wave changes, but no evidence of  acute ischemia.   ____________________________________________  RADIOLOGY   Dg Chest 2 View  03/12/2015  CLINICAL DATA:  Patient presents to the ED with diarrhea, vomiting and cough x 3 weeks. Patient reports diarrhea x 2 and vomiting x 3 in the past 24 hours. Patient reports cough and congestion as well. Patient has COPD and CHF and is on oxygen. EXAM: CHEST  2 VIEW COMPARISON:  12/23/2013 FINDINGS: Heart size is normal. Right lower lobe infiltrate is present, obscuring the posterior right hemidiaphragm. No pulmonary edema. Note is made of S shaped thoracolumbar scoliosis. IMPRESSION: Right lower lobe infiltrate. Electronically Signed   By: Nolon Nations M.D.   On: 03/12/2015 13:14   ____________________________________________   PROCEDURES  Procedure(s) performed: None  Critical Care performed: Yes, see critical care note(s)   CRITICAL CARE Performed by: Hinda Kehr   Total critical care time: 30 minutes  Critical care time was exclusive of separately billable procedures and treating other patients.  Critical care was  necessary to treat or prevent imminent or life-threatening deterioration.  Critical care was time spent personally by me on the following activities: development of treatment plan with patient and/or surrogate as well as nursing, discussions with consultants, evaluation of patient's response to treatment, examination of patient, obtaining history from patient or surrogate, ordering and performing treatments and interventions, ordering and review of laboratory studies, ordering and review of radiographic studies, pulse oximetry and re-evaluation of patient's condition.  ____________________________________________   INITIAL IMPRESSION / ASSESSMENT AND PLAN / ED COURSE  Pertinent labs & imaging results that were available during my care of the patient were reviewed by me and considered in my medical decision making (see chart for details).  When the patient was initially triage she was not thought to meet septic criteria but her initial set of labs came back with a white blood cell count of greater than 30.  Given that she is tachypneic, tachycardic, has a leukocytosis, and has an SPO2 of 90% on 2 L, I ordered the rest of the sepsis protocol.  Given that she has CHF we will be careful about fluids and though I ordered 30 mL/kg I will discontinue this as appropriate particularly if her lactate is within normal limits.  I am giving her empiric antibiotics for community-acquired pneumonia and I will also treat for COPD with nebulizer treatments.  Given that her wheezing is quite mild low hold off on Solu-Medrol at this time given her apparent infectious symptoms and I do not want to further suppress her immune system unless it is necessary.    ----------------------------------------- 1:36 PM on 03/12/2015 -----------------------------------------  Patient has a right lower lobe infiltrate.  Her troponin is elevated at 0.12, but I believe this to be a stress leak rather than evidence of ACS.  She feels  better after 2 nebs.  I will admit her for sepsis secondary to community-acquired pneumonia and for her elevated troponin for serial enzymes.   ____________________________________________  FINAL CLINICAL IMPRESSION(S) / ED DIAGNOSES  Final diagnoses:  Sepsis, due to unspecified organism ([place])  CAP (community acquired pneumonia)  Elevated troponin I level      NEW MEDICATIONS STARTED DURING THIS VISIT:  Current Discharge Medication List       Hinda Kehr, MD 03/12/15 2306

## 2015-03-13 DIAGNOSIS — R7989 Other specified abnormal findings of blood chemistry: Secondary | ICD-10-CM

## 2015-03-13 DIAGNOSIS — J189 Pneumonia, unspecified organism: Secondary | ICD-10-CM

## 2015-03-13 LAB — URINALYSIS COMPLETE WITH MICROSCOPIC (ARMC ONLY)
BILIRUBIN URINE: NEGATIVE
Glucose, UA: NEGATIVE mg/dL
Ketones, ur: NEGATIVE mg/dL
Leukocytes, UA: NEGATIVE
Nitrite: NEGATIVE
PH: 6 (ref 5.0–8.0)
PROTEIN: 30 mg/dL — AB
Specific Gravity, Urine: 1.021 (ref 1.005–1.030)

## 2015-03-13 LAB — TROPONIN I: Troponin I: <0.03 ng/mL (ref <0.031)

## 2015-03-13 MED ORDER — ENOXAPARIN SODIUM 40 MG/0.4ML ~~LOC~~ SOLN
40.0000 mg | Freq: Two times a day (BID) | SUBCUTANEOUS | Status: DC
  Administered 2015-03-13 – 2015-03-14 (×2): 40 mg via SUBCUTANEOUS
  Filled 2015-03-13 (×2): qty 0.4

## 2015-03-13 MED ORDER — AMLODIPINE BESYLATE 5 MG PO TABS
5.0000 mg | ORAL_TABLET | Freq: Every day | ORAL | Status: DC
  Filled 2015-03-13: qty 1

## 2015-03-13 MED ORDER — PREDNISONE 50 MG PO TABS
60.0000 mg | ORAL_TABLET | Freq: Every day | ORAL | Status: DC
  Administered 2015-03-13 – 2015-03-14 (×2): 60 mg via ORAL
  Filled 2015-03-13 (×2): qty 1

## 2015-03-13 MED ORDER — PREDNISONE 50 MG PO TABS: 60.0000 mg | ORAL_TABLET | Freq: Every day | ORAL | Status: DC

## 2015-03-13 NOTE — Progress Notes (Signed)
Patient has rested quietly tonight. Minimal complaints of pain acknowledged and treated with PRN pain medications; no signs of discomfort or distress noted. Patient also complained of SOB once and it was resolved after a PRN breathing treatment. Patient's stool sample was negative for C-Diff and patient taken off of enteric precautions. Nursing staff will continue to monitor. Earleen Reaper, RN

## 2015-03-13 NOTE — Progress Notes (Signed)
Order for enoxaparin 40 mg subcutaneously daily for DVT prophylaxis was changed to enoxaparin 40 mg subcutaneously BID per anticoagulation protocol based on patients BMI > 40 and CrCl >30 mL/min.   Kelly Hale Osie Amparo 1:58 PM

## 2015-03-13 NOTE — Progress Notes (Signed)
Alert and oriented. Vitals are stable. No complaints of pain. Patient does complain of shortness of breath while ambulating. Oxygen saturation was checked after patient ambulated to bathroom, 90% on 4L. Have no been able to wean patient down yet. Sinus rhythm on tele to low 100's sinus tach. Patient still receiving IV antibiotics. Will continue to monitor.

## 2015-03-13 NOTE — Progress Notes (Signed)
[facility] NAME: Kelly Hale    MR#:  [id number]  DATE OF BIRTH:  [birth year]  SUBJECTIVE:   Patient still with cough but feeling improved since admission. She denies chest pain. She continues with some shortness of breath.  REVIEW OF SYSTEMS:    Review of Systems  Constitutional: Negative for fever, chills and malaise/fatigue.  HENT: Negative for sore throat.   Eyes: Negative for blurred vision.  Respiratory: Positive for cough and shortness of breath. Negative for hemoptysis and wheezing.   Cardiovascular: Negative for chest pain, palpitations and leg swelling.  Gastrointestinal: Negative for nausea, vomiting, abdominal pain, diarrhea and blood in stool.  Genitourinary: Negative for dysuria.  Musculoskeletal: Negative for back pain.  Neurological: Negative for dizziness, tremors and headaches.  Endo/Heme/Allergies: Does not bruise/bleed easily.    Tolerating Diet:yes       DRUG ALLERGIES:   Allergies  Allergen Reactions  . Aspirin Other (See Comments)    REACTION: intol-asthma Patient takes low dose of aspirin.    VITALS:  Blood pressure 112/65, pulse 85, temperature 98.2 F (36.8 C), temperature source Oral, resp. rate 18, height 5\' 7"  (1.702 m), weight 122.018 kg (269 lb), SpO2 94 %.  PHYSICAL EXAMINATION:   Physical Exam  Constitutional: She is oriented to person, place, and time and well-developed, well-nourished, and in no distress. No distress.  HENT:  Head: Normocephalic.  Eyes: No scleral icterus.  Neck: Normal range of motion. Neck supple. No JVD present. No tracheal deviation present.  Cardiovascular: Normal rate, regular rhythm and normal heart sounds.  Exam reveals no gallop and no friction rub.   No murmur heard. Pulmonary/Chest: Effort normal. No respiratory distress. She has wheezes. She has no rales. She exhibits no tenderness.  Crackles right base  Abdominal: Soft. Bowel sounds are  normal. She exhibits no distension and no mass. There is no tenderness. There is no rebound and no guarding.  Musculoskeletal: Normal range of motion. She exhibits no edema.  Neurological: She is alert and oriented to person, place, and time.  Skin: Skin is warm. No rash noted. No erythema.  Psychiatric: Affect and judgment normal.      LABORATORY PANEL:   CBC  Recent Labs Lab 03/12/15 1202  WBC 31.3*  HGB 14.6  HCT 44.8  PLT 274   ------------------------------------------------------------------------------------------------------------------  Chemistries   Recent Labs Lab 03/12/15 1202  NA 137  K 3.6  CL 96*  CO2 30  GLUCOSE 123*  BUN 19  CREATININE 0.67  CALCIUM 8.9  AST 19  ALT 14  ALKPHOS 48  BILITOT 1.2   ------------------------------------------------------------------------------------------------------------------  Cardiac Enzymes  Recent Labs Lab 03/12/15 1202 03/13/15 0129  TROPONINI 0.12* <0.03   ------------------------------------------------------------------------------------------------------------------  RADIOLOGY:  Dg Chest 2 View  03/12/2015  CLINICAL DATA:  Patient presents to the ED with diarrhea, vomiting and cough x 3 weeks. Patient reports diarrhea x 2 and vomiting x 3 in the past 24 hours. Patient reports cough and congestion as well. Patient has COPD and CHF and is on oxygen. EXAM: CHEST  2 VIEW COMPARISON:  12/23/2013 FINDINGS: Heart size is normal. Right lower lobe infiltrate is present, obscuring the posterior right hemidiaphragm. No pulmonary edema. Note is made of S shaped thoracolumbar scoliosis. IMPRESSION: Right lower lobe infiltrate. Electronically Signed   By: Nolon Nations M.D.   On: 03/12/2015 13:14   Dg Abd 2 Views  03/12/2015  CLINICAL DATA:  Nausea, vomiting, and diarrhea for 3  days EXAM: ABDOMEN - 2 VIEW COMPARISON:  None. FINDINGS: Supine and upright images obtained. There is no bowel dilatation. There are  scattered air-fluid levels. No free air. There a small focus of infiltrate in the right lung base. No abnormal calcifications are identified. There is degenerative change in the lumbar spine with lumbar levoscoliosis. IMPRESSION: No bowel dilatation. Scattered air-fluid levels. This finding could be indicative of early enteritis or ileus. Obstruction is not felt to be likely. No free air. Small area of infiltrate right base. Electronically Signed   By: Lowella Grip III M.D.   On: 03/12/2015 14:56     ASSESSMENT AND PLAN:   59 year old female with a history of COPD and ongoing tobacco abuse who presented with cough and found to have pneumonia along with COPD exacerbation.   1. Acute hypoxic respiratory failure: This is secondary to combination of COPD exacerbation along with community-acquired pneumonia. Patient will be weaned off oxygen as tolerated. Continue antibiotics and COPD treatment as outlined below.  2. Sepsis secondary to community-acquired pneumonia: Continue azithromycin and Rocephin and follow up on blood cultures.  3. COPD exacerbation: Patient appears to have wheezing this morning. This is likely secondary to community-acquired pneumonia. I will start steroids, wean as tolerated. Continue inhalers and DuoNeb's.  4. Tobacco dependence: Patient was counseled by admitting physician. Continue nicotine patch.     Management plans discussed with the patient and she is in agreement.  CODE STATUS: FULL  TOTAL TIME TAKING CARE OF THIS PATIENT: 30 minutes.     POSSIBLE D/C tomorrow, DEPENDING ON CLINICAL CONDITION.   Alford Gamero M.D on 03/13/2015 at 1:24 PM  Between 7am to 6pm - Pager - [phone] After 6pm go to [email or web address] - password [email or web address] [facility]  Office  [phone]  CC: Primary care physician; Vidal Schwalbe, MD  Note: This dictation was prepared with Dragon dictation along with smaller phrase technology. Any transcriptional  errors that result from this process are unintentional.

## 2015-03-13 NOTE — Consult Note (Addendum)
CARDIOLOGY CONSULT NOTE  Patient ID: Kelly Hale MRN: [id number] DOB/AGE: 59-10-1955 59 y.o.  Admit date: 03/12/2015 Referring Physician : Dr. Clayton Bibles Primary Cardiologist : Dr. [name] Reason for Consultation : Elevated Troponin.   HPI:  Kelly Hale is a 59 year old woman with morbid obesity, long smoking history, severe COPD, hypertension, obstructive sleep apnea on CPAP, and chronic diastolic CHF. Echocardiogram in  09, 2015 showed normal ejection fraction, diastolic dysfunction, normal right heart size and function, normal right ventricular systolic pressure.  She presented with cough productive of yellow sputum, shortness of breath and fatigue. On presentation, she was noted to be mildly tachycardiac with significant leukocytosis at 30,000. Chest x-ray showed right lower lobe infiltrate. Initial troponin was mildly elevated but the second one came back negative. She denies any chest pain. EKG showed nonspecific changes. She was started on antibiotics for pneumonia and she feels better today.  Review of systems complete and found to be negative unless listed above   Past Medical History  Diagnosis Date  . Asthma   . Hyperlipidemia   . Aspirin allergy   . Morbid obesity ([place])   . Tobacco abuse   . CHF (congestive heart failure) ([place])   . COPD (chronic obstructive pulmonary disease) ([facility])     oxygen at home as needed (& at night)    Family History  Problem Relation Age of Onset  . Other Father     MVA  . Hypertension Mother   . Hyperlipidemia Mother   . Asthma Mother   . Other Mother     heart valve replaced  . Diabetes Paternal Grandfather   . Hypertension Maternal Grandfather   . Hyperlipidemia Maternal Grandfather   . Stroke Maternal Grandfather   . Hypertension Maternal Grandmother   . Alzheimer's disease Maternal Grandmother   . Other Maternal Uncle     laryngeal cancer    Social History   Social History  . Marital Status: Married    Spouse Name: Wille Glaser  . Number  of Children: N/A  . Years of Education: N/A   Occupational History  . 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 Smoking status: Light Tobacco Smoker -- 0.00 packs/day for 30 years    Types: Cigarettes  . Smokeless tobacco: Never Used     Comment: quit in Sept 2015, but started back smoking up to 2 cigs per day  . Alcohol Use: No  . Drug Use: No  . Sexual Activity: Not on file   Other Topics Concern  . Not on file   Social History Narrative    Past Surgical History  Procedure Laterality Date  . Cesarean section    . Tubal ligation    . Knee arthroscopy      right     Prescriptions prior to admission  Medication Sig Dispense Refill Last Dose  . albuterol (PROVENTIL HFA;VENTOLIN HFA) 108 (90 BASE) MCG/ACT inhaler Inhale 1-2 puffs into the lungs every 6 (six) hours as needed for wheezing or shortness of breath.   PRN at PRN  . albuterol (PROVENTIL) (2.5 MG/3ML) 0.083% nebulizer solution Take 3 mLs (2.5 mg total) by nebulization every 6 (six) hours as needed for wheezing or shortness of breath. 100 vial 6 PRN at PRN  . amLODipine (NORVASC) 5 MG tablet Take 1 tablet by mouth daily.   03/12/2015 at Unknown time  . aspirin EC 81 MG tablet Take 81 mg by mouth daily.  03/12/2015 at Unknown time  . Fluticasone-Salmeterol (ADVAIR DISKUS) 500-50 MCG/DOSE AEPB Inhale 1 puff into the lungs 2 (two) times daily. Rinse mouth after use. 180 each 1 03/12/2015 at Unknown time  . furosemide (LASIX) 20 MG tablet Take 1 tablet (20 mg total) by mouth 2 (two) times daily. 180 tablet 3 03/11/2015 at Unknown time  . Multiple Vitamin (MULTIVITAMIN) capsule Take 1 capsule by mouth daily.   Past Month at Unknown time  . OXYGEN Inhale 2 L into the lungs daily. 2 liters daily   03/11/2015 at Unknown time  . Vitamin D, Cholecalciferol, 1000 UNITS CAPS Take 1,000 Units by mouth daily.    03/11/2015 at Unknown time    Physical Exam: Blood pressure 112/65, pulse 85, temperature  98.2 F (36.8 C), temperature source Oral, resp. rate 18, height 5\' 7"  (1.702 m), weight 269 lb (122.018 kg), SpO2 94 %.  Constitutional: She is oriented to person, place, and time. She appears well-developed and well-nourished. No distress.  HENT: No nasal discharge.  Head: Normocephalic and atraumatic.  Eyes: Pupils are equal and round. No discharge.  Neck: Normal range of motion. Neck supple. No JVD present. No thyromegaly present.  Cardiovascular: Normal rate, regular rhythm, normal heart sounds. Exam reveals no gallop and no friction rub. No murmur heard.  Pulmonary/Chest:  No respiratory distress. She has mild expiratory wheezes.  Abdominal: Soft. Bowel sounds are normal. She exhibits no distension. There is no tenderness. There is no rebound and no guarding.  Musculoskeletal: Normal range of motion. She exhibits no edema and no tenderness.  Neurological: She is alert and oriented to person, place, and time. Coordination normal.  Skin: Skin is warm and dry. No rash noted. She is not diaphoretic. No erythema. No pallor.  Psychiatric: She has a normal mood and affect. Her behavior is normal. Judgment and thought content normal.        Labs:   Lab Results  Component Value Date   WBC 31.3* 03/12/2015   HGB 14.6 03/12/2015   HCT 44.8 03/12/2015   MCV 92.0 03/12/2015   PLT 274 03/12/2015    Recent Labs Lab 03/12/15 1202  NA 137  K 3.6  CL 96*  CO2 30  BUN 19  CREATININE 0.67  CALCIUM 8.9  PROT 7.3  BILITOT 1.2  ALKPHOS 48  ALT 14  AST 19  GLUCOSE 123*   Lab Results  Component Value Date   CKMB 2.0 12/23/2013   TROPONINI <0.03 03/13/2015      Radiology: Right lower lobe infiltrate on chest x-ray. EKG: Normal sinus rhythm with left atrial enlargement and nonspecific T wave changes  ASSESSMENT AND PLAN:   1. Elevated troponin: Is likely due to supply demand ischemia from underlying pneumonia and mild tachycardia. EKG does not show any acute changes and second  troponin was normal. I do not recommend any further cardiac workup. Outpatient stress test can be considered.  2. Pneumonia with early sepsis the patient improved with antibiotics and  she is feeling better. 3. Chronic diastolic heart failure: There is no evidence of volume overload and BNP was only 163.  Will sign off. Please call with questions.   Signed: Kathlyn Sacramento MD, [facility] 03/13/2015, 12:07 PM

## 2015-03-13 NOTE — Progress Notes (Signed)
Contacted Dr. Benjie Karvonen about new norvasc order. Patient states she takes this at home, but with ordered metoprolol BP is running on the low side. MD stated she did not put order in for norvasc, but since it's part of home meds it was added by pharmacy possibly. MD stated okay to hold medication at this time.

## 2015-03-14 ENCOUNTER — Inpatient Hospital Stay: Payer: 59

## 2015-03-14 LAB — STOOL CULTURE

## 2015-03-14 LAB — BASIC METABOLIC PANEL
ANION GAP: 5 (ref 5–15)
BUN: 15 mg/dL (ref 6–20)
CALCIUM: 9 mg/dL (ref 8.9–10.3)
CHLORIDE: 102 mmol/L (ref 101–111)
CO2: 32 mmol/L (ref 22–32)
Creatinine, Ser: 0.57 mg/dL (ref 0.44–1.00)
GFR calc non Af Amer: >60 mL/min (ref >60)
Glucose, Bld: 113 mg/dL — ABNORMAL HIGH (ref 65–99)
POTASSIUM: 3.9 mmol/L (ref 3.5–5.1)
Sodium: 139 mmol/L (ref 135–145)

## 2015-03-14 MED ORDER — ASPIRIN 81 MG PO TABS: 81.0000 mg | ORAL_TABLET | Freq: Every day | ORAL | Status: AC

## 2015-03-14 MED ORDER — PANTOPRAZOLE SODIUM 40 MG PO TBEC: 40.0000 mg | DELAYED_RELEASE_TABLET | Freq: Two times a day (BID) | ORAL | Status: DC

## 2015-03-14 MED ORDER — AZITHROMYCIN 250 MG PO TABS: 500.0000 mg | ORAL_TABLET | Freq: Every day | ORAL | Status: DC

## 2015-03-14 MED ORDER — PREDNISONE 10 MG (21) PO TBPK: 10.0000 mg | ORAL_TABLET | Freq: Every day | ORAL | Status: DC

## 2015-03-14 MED ORDER — NICOTINE 21 MG/24HR TD PT24: 21.0000 mg | MEDICATED_PATCH | Freq: Every day | TRANSDERMAL | Status: DC

## 2015-03-14 MED ORDER — LEVOFLOXACIN 500 MG PO TABS: 500.0000 mg | ORAL_TABLET | Freq: Every day | ORAL | Status: AC

## 2015-03-14 MED ORDER — LEVOFLOXACIN 500 MG PO TABS: 500.0000 mg | ORAL_TABLET | Freq: Every day | ORAL | Status: DC

## 2015-03-14 NOTE — Progress Notes (Signed)
Discharge instructions given to patient and husband. Prescriptions were printed and given. Education given on pneumonia, heart failure, and smoking cessation. Patient will follow up with her pulmonologist. No questions at this time.

## 2015-03-14 NOTE — Discharge Instructions (Signed)
Continue to wear oxygen at all times at 2-3 L until follow up with Dr. Stevenson Clinch.  Call Monday for appointment ASAP. Take prednisone taper and antibiotics as prescribed. You must stop smoking.  DIET:  Cardiac diet  DISCHARGE CONDITION:  Fair  ACTIVITY:  Activity as tolerated  OXYGEN:  Home Oxygen: Yes.     Oxygen Delivery: 3 liters/min via Patient connected to nasal cannula oxygen  DISCHARGE LOCATION:  home   If you experience worsening of your admission symptoms, develop shortness of breath, life threatening emergency, suicidal or homicidal thoughts you must seek medical attention immediately by calling 911 or calling your MD immediately  if symptoms less severe.  You Must read complete instructions/literature along with all the possible adverse reactions/side effects for all the Medicines you take and that have been prescribed to you. Take any new Medicines after you have completely understood and accpet all the possible adverse reactions/side effects.   Please note  You were cared for by a hospitalist during your hospital stay. If you have any questions about your discharge medications or the care you received while you were in the hospital after you are discharged, you can call the unit and asked to speak with the hospitalist on call if the hospitalist that took care of you is not available. Once you are discharged, your primary care physician will handle any further medical issues. Please note that NO REFILLS for any discharge medications will be authorized once you are discharged, as it is imperative that you return to your primary care physician (or establish a relationship with a primary care physician if you do not have one) for your aftercare needs so that they can reassess your need for medications and monitor your lab values.

## 2015-03-14 NOTE — Discharge Summary (Signed)
[facility] NAME: Kelly Hale    MR#:  [id number]  DATE OF BIRTH:  06-22-1955  DATE OF ADMISSION:  03/12/2015 ADMITTING PHYSICIAN: Theodoro Grist, MD  DATE OF DISCHARGE: 03/14/2015  PRIMARY CARE PHYSICIAN: Vidal Schwalbe, MD    ADMISSION DIAGNOSIS:  CAP (community acquired pneumonia) [J18.9] Elevated troponin I level [R79.89] Nausea vomiting and diarrhea [R11.2, R19.7] Sepsis, due to unspecified organism ([place]) [A41.9]  DISCHARGE DIAGNOSIS:  Active Problems:   COPD exacerbation ([place])   Community acquired pneumonia   Elevated troponin   SECONDARY DIAGNOSIS:   Past Medical History  Diagnosis Date  . Asthma   . Hyperlipidemia   . Aspirin allergy   . Morbid obesity ([place])   . Tobacco abuse   . CHF (congestive heart failure) ([place])   . COPD (chronic obstructive pulmonary disease) ([facility])     oxygen at home as needed (& at night)    HOSPITAL COURSE:   59 year old female with a history of COPD and ongoing tobacco abuse who presented with cough and found to have pneumonia along with COPD exacerbation.  1. Acute hypoxic respiratory failure: This is secondary to combination of COPD exacerbation along with community-acquired pneumonia.  2. Sepsis secondary to community-acquired pneumonia: Sepsis now resolved. When cultures negative. Treated with azithromycin and Rocephin while inpatient. Discharging on Levaquin.  3. COPD exacerbation: Breathing much more comfortably today though still requiring 2-3 L to maintain oxygen saturations. She has home oxygen which she had been using at night only. She will discharge on continuous oxygen and will follow up with her pulmonologist in 1 week for adjustment. She'll also be discharge in a 2 week prednisone taper as well as Levaquin to complete a 10 day course of antibiotics. This is been discussed with her pulmonologist.   4. Tobacco dependence: Patient was  counseled by admitting physician. Continue nicotine patch. Prescription provided at discharge.  5. Elevated troponin: Seen by cardiology during hospitalization. Most likely due to supply demand ischemia from underlying pneumonia and tachycardia. Outpatient stress testing could be considered after discharge. 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  6. Leukocytosis: Due to high-dose steroids. CBC should be rechecked at next outpatient appointment.  DISCHARGE CONDITIONS:   Stable  CONSULTS OBTAINED:  Treatment Team:  Wellington Hampshire, MD  DRUG ALLERGIES:   Allergies  Allergen Reactions  . Aspirin Other (See Comments)    REACTION: intol-asthma Patient takes low dose of aspirin.    DISCHARGE MEDICATIONS:   Current Discharge Medication List    START taking these medications   Details  levofloxacin (LEVAQUIN) 500 MG tablet Take 1 tablet (500 mg total) by mouth daily. Qty: 7 tablet, Refills: 0    nicotine (NICODERM CQ - DOSED IN MG/24 HOURS) 21 mg/24hr patch Place 1 patch (21 mg total) onto the skin daily. Qty: 28 patch, Refills: 0    predniSONE (STERAPRED UNI-PAK 21 TAB) 10 MG (21) TBPK tablet Take 1 tablet (10 mg total) by mouth daily. Start at 60 mg, decrease dose by 10 mg every two days. 6 tablets for 2 days, 5 tablets for 2 days, 4 tablets for 2 days, 3 tablets for 2 days, 2 tablets for 2 days, 1 tablet for 2 days and then stop. Qty: 42 tablet, Refills: 0      CONTINUE these medications which have CHANGED   Details  aspirin 81 MG tablet Take 1  tablet (81 mg total) by mouth daily. Qty: 30 tablet, Refills: 0      CONTINUE these medications which have NOT CHANGED   Details  albuterol (PROVENTIL HFA;VENTOLIN HFA) 108 (90 BASE) MCG/ACT inhaler Inhale 1-2 puffs into the lungs every 6 (six) hours as needed for wheezing or shortness of breath.    albuterol (PROVENTIL) (2.5 MG/3ML) 0.083% nebulizer solution Take 3  mLs (2.5 mg total) by nebulization every 6 (six) hours as needed for wheezing or shortness of breath. Qty: 100 vial, Refills: 6    amLODipine (NORVASC) 5 MG tablet Take 1 tablet by mouth daily.    aspirin EC 81 MG tablet Take 81 mg by mouth daily.    Fluticasone-Salmeterol (ADVAIR DISKUS) 500-50 MCG/DOSE AEPB Inhale 1 puff into the lungs 2 (two) times daily. Rinse mouth after use. Qty: 180 each, Refills: 1    furosemide (LASIX) 20 MG tablet Take 1 tablet (20 mg total) by mouth 2 (two) times daily. Qty: 180 tablet, Refills: 3    Multiple Vitamin (MULTIVITAMIN) capsule Take 1 capsule by mouth daily.    OXYGEN Inhale 2 L into the lungs daily. 2 liters daily    Vitamin D, Cholecalciferol, 1000 UNITS CAPS Take 1,000 Units by mouth daily.          DISCHARGE INSTRUCTIONS:    Stable condition. Has home oxygen no additional home health needs. Cardiac diet.  If you experience worsening of your admission symptoms, develop shortness of breath, life threatening emergency, suicidal or homicidal thoughts you must seek medical attention immediately by calling 911 or calling your MD immediately  if symptoms less severe.  You Must read complete instructions/literature along with all the possible adverse reactions/side effects for all the Medicines you take and that have been prescribed to you. Take any new Medicines after you have completely understood and accept all the possible adverse reactions/side effects.   Please note  You were cared for by a hospitalist during your hospital stay. If you have any questions about your discharge medications or the care you received while you were in the hospital after you are discharged, you can call the unit and asked to speak with the hospitalist on call if the hospitalist that took care of you is not available. Once you are discharged, your primary care physician will handle any further medical issues. Please note that NO REFILLS for any discharge medications  will be authorized once you are discharged, as it is imperative that you return to your primary care physician (or establish a relationship with a primary care physician if you do not have one) for your aftercare needs so that they can reassess your need for medications and monitor your lab values.    Today   CHIEF COMPLAINT:   Chief Complaint  Patient presents with  . Emesis  . Diarrhea  . Cough    HISTORY OF PRESENT ILLNESS:  Kelly Hale is a 59 y.o. female with a known history of COPD and ongoing tobacco abuse who presents to the hospital with complaints of cough, wheezing, shortness of breath, yellow phlegm production for the past 3 days. Patient is also complaining of nausea, vomiting and diarrhea over the past 3 days. She admits of having some black stool but denies any hematemesis or bright red blood per rectum. She feels feverish and chilly. Her temperature is around 99 she has some chest congestion and has difficulty lifting up phlegm, she admits of using few pillows to sleep, but that it has been  chronic. Denies any lower extremity swelling. Admits of using oxygen more frequently at daytime over the past few days, admits of of pain in the lower chest posteriorly with cough paroxysms. On arrival to the hospital, she was noted to be somewhat hypoxic with O2 sats of 90% on 2 L of oxygen through nasal cannula. She was also tachycardic with heart rate was of 110. Labs revealed leukocytosis of about 31,000, mild to elevation of troponin of 0.12. Normal lactic acid level of 1.2. Chest x-ray revealed right lower lobe infiltrate. Hospitalist services were contacted for admission  VITAL SIGNS:  Blood pressure 114/58, pulse 88, temperature 97.7 F (36.5 C), temperature source Oral, resp. rate 18, height 5\' 7"  (1.702 m), weight 122.018 kg (269 lb), SpO2 94 %.  I/O:   Intake/Output Summary (Last 24 hours) at 03/14/15 1424 Last data filed at 03/14/15 1359  Gross per 24 hour  Intake   1848  ml  Output   1300 ml  Net    548 ml    PHYSICAL EXAMINATION:  GENERAL:  59 y.o.-year-old patient sitting up eating lunch, no distress LUNGS: Scattered fine wheezes, good air movement, no coughing, no respiratory distress CARDIOVASCULAR: S1, S2 normal. No murmurs, rubs, or gallops.  ABDOMEN: Soft, non-tender, non-distended. Bowel sounds present. No organomegaly or mass.  EXTREMITIES: No pedal edema, cyanosis, or clubbing.  NEUROLOGIC: Cranial nerves II through XII are intact. Muscle strength 5/5 in all extremities. Sensation intact. Gait not checked.  PSYCHIATRIC: The patient is alert and oriented x 3.  SKIN: No obvious rash, lesion, or ulcer.   DATA REVIEW:   CBC  Recent Labs Lab 03/12/15 1202  WBC 31.3*  HGB 14.6  HCT 44.8  PLT 274    Chemistries   Recent Labs Lab 03/12/15 1202 03/14/15 1124  NA 137 139  K 3.6 3.9  CL 96* 102  CO2 30 32  GLUCOSE 123* 113*  BUN 19 15  CREATININE 0.67 0.57  CALCIUM 8.9 9.0  AST 19  --   ALT 14  --   ALKPHOS 48  --   BILITOT 1.2  --     Cardiac Enzymes  Recent Labs Lab 03/13/15 [id number]  TROPONINI DUPLICATE. NOTIFIED RN [facility] LEE  <0.03    Microbiology Results  Results for orders placed or performed during the hospital encounter of 03/12/15  Blood culture (routine x 2)     Status: None (Preliminary result)   Collection Time: 03/12/15 12:51 PM  Result Value Ref Range Status   Specimen Description BLOOD LEFT ASSIST CONTROL  Final   Special Requests BOTTLES DRAWN AEROBIC AND ANAEROBIC  2CC  Final   Culture NO GROWTH 2 DAYS  Final   Report Status PENDING  Incomplete  Blood culture (routine x 2)     Status: None (Preliminary result)   Collection Time: 03/12/15 12:51 PM  Result Value Ref Range Status   Specimen Description BLOOD  Final   Special Requests Normal  Final   Culture NO GROWTH 2 DAYS  Final   Report Status PENDING  Incomplete  C difficile quick scan w PCR reflex     Status: None   Collection Time: 03/12/15  7:58  PM  Result Value Ref Range Status   C Diff antigen NEGATIVE NEGATIVE Final   C Diff toxin NEGATIVE NEGATIVE Final   C Diff interpretation Negative for C. difficile  Final  Stool culture     Status: None   Collection Time: 03/12/15  7:58 PM  Result  Value Ref Range Status   Specimen Description STOOL  Final   Special Requests NONE  Final   Culture   Final    NO SALMONELLA OR SHIGELLA ISOLATED No Pathogenic E. coli detected NO CAMPYLOBACTER DETECTED    Report Status 03/14/2015 FINAL  Final  Urine culture     Status: None (Preliminary result)   Collection Time: 03/13/15  9:45 AM  Result Value Ref Range Status   Specimen Description URINE, RANDOM  Final   Special Requests NONE  Final   Culture NO GROWTH < 24 HOURS  Final   Report Status PENDING  Incomplete    RADIOLOGY:  Dg Chest 2 View  03/14/2015  CLINICAL DATA:  Cough and shortness of breath EXAM: CHEST  2 VIEW COMPARISON:  Two days ago FINDINGS: Unchanged asymmetric airspace disease in the right lower lobe. No visualized effusion or cavitation. Normal heart size and stable mediastinal contours. Mild generalized interstitial coarsening, likely from patient's COPD history. IMPRESSION: Unchanged right lower lobe pneumonia. Electronically Signed   By: Monte Fantasia M.D.   On: 03/14/2015 12:13   Dg Abd 2 Views  03/12/2015  CLINICAL DATA:  Nausea, vomiting, and diarrhea for 3 days EXAM: ABDOMEN - 2 VIEW COMPARISON:  None. FINDINGS: Supine and upright images obtained. There is no bowel dilatation. There are scattered air-fluid levels. No free air. There a small focus of infiltrate in the right lung base. No abnormal calcifications are identified. There is degenerative change in the lumbar spine with lumbar levoscoliosis. IMPRESSION: No bowel dilatation. Scattered air-fluid levels. This finding could be indicative of early enteritis or ileus. Obstruction is not felt to be likely. No free air. Small area of infiltrate right base. Electronically  Signed   By: Lowella Grip III M.D.   On: 03/12/2015 14:56    EKG:   Orders placed or performed during the hospital encounter of 03/12/15  . ED EKG  . ED EKG  . EKG 12-Lead  . EKG 12-Lead      Management plans discussed with the patient, family and they are in agreement.  CODE STATUS:     Code Status Orders        Start     Ordered   03/12/15 1532  Full code   Continuous     03/12/15 1531      TOTAL TIME TAKING CARE OF THIS PATIENT: 35 minutes.  Greater than 50% of time spent in care coordination and counseling.  Myrtis Ser M.D on 03/14/2015 at 2:24 PM  Between 7am to 6pm - Pager - [phone]  After 6pm go to [email or web address] - password [email or web address] [facility]  Office  [phone]  CC: Primary care physician; Vidal Schwalbe, MD

## 2015-03-14 NOTE — Progress Notes (Signed)
Key Points: Use following P&T approved IV to PO antibiotic change policy.  Description contains the criteria that are approved Note: Policy Excludes:  Esophagectomy patientsPHARMACIST - PHYSICIAN COMMUNICATION DR:   Volanda Napoleon CONCERNING: IV to Oral Route Change Policy  RECOMMENDATION: This patient is receiving pantoprazole by the intravenous route.  Based on criteria approved by the Pharmacy and Therapeutics Committee, the intravenous medication(s) is/are being converted to the equivalent oral dose form(s).   DESCRIPTION: These criteria include:  The patient is eating (either orally or via tube) and/or has been taking other orally administered medications for a least 24 hours  The patient has no evidence of active gastrointestinal bleeding or impaired GI absorption (gastrectomy, short bowel, patient on TNA or NPO).  If you have questions about this conversion, please contact the Pharmacy Department  []   [phone] )  [facility] [x]   [phone] )  [facility] []   [phone] )  [facility] []   [phone] )  [facility] []   [phone] )  [place] [facility] [facility] [address] 03/14/2015 11:49 AM

## 2015-03-14 NOTE — Progress Notes (Signed)
PHARMACIST - PHYSICIAN COMMUNICATION DR:   [place] CONCERNING: Antibiotic IV to Oral Route Change Policy  RECOMMENDATION: This patient is receiving azithromycin by the intravenous route.  Based on criteria approved by the Pharmacy and Therapeutics Committee, the antibiotic(s) is/are being converted to the equivalent oral dose form(s).   DESCRIPTION: These criteria include:  Patient being treated for a respiratory tract infection, urinary tract infection, cellulitis or clostridium difficile associated diarrhea if on metronidazole  The patient is not neutropenic and does not exhibit a GI malabsorption state  The patient is eating (either orally or via tube) and/or has been taking other orally administered medications for a least 24 hours  The patient is improving clinically and has a Tmax < 100.5  If you have questions about this conversion, please contact the Pharmacy Department  []  ( [phone] )  [place] [x]  ( [phone] )  [facility] []  ( [phone] )  [place] []  ( [phone] )  [facility] []  ( [phone] )  [facility]   Farron Lafond, PharmD  

## 2015-03-15 LAB — URINE CULTURE: Culture: NO GROWTH

## 2015-03-17 ENCOUNTER — Ambulatory Visit (INDEPENDENT_AMBULATORY_CARE_PROVIDER_SITE_OTHER): Payer: 59 | Admitting: Pulmonary Disease

## 2015-03-17 ENCOUNTER — Encounter: Payer: Self-pay | Admitting: Pulmonary Disease

## 2015-03-17 VITALS — BP 122/60 | HR 92 | Ht 66.5 in | Wt 271.2 lb

## 2015-03-17 DIAGNOSIS — F17200 Nicotine dependence, unspecified, uncomplicated: Secondary | ICD-10-CM

## 2015-03-17 DIAGNOSIS — R0902 Hypoxemia: Secondary | ICD-10-CM

## 2015-03-17 DIAGNOSIS — J449 Chronic obstructive pulmonary disease, unspecified: Secondary | ICD-10-CM | POA: Diagnosis not present

## 2015-03-17 DIAGNOSIS — J189 Pneumonia, unspecified organism: Secondary | ICD-10-CM | POA: Diagnosis not present

## 2015-03-17 DIAGNOSIS — Z720 Tobacco use: Secondary | ICD-10-CM | POA: Diagnosis not present

## 2015-03-17 LAB — CULTURE, BLOOD (ROUTINE X 2)
Culture: NO GROWTH
Culture: NO GROWTH
Special Requests: NORMAL

## 2015-03-18 NOTE — Progress Notes (Signed)
PROBLEMS: COPD Smoker - 4-5 cigs/d as of 03/14/15   INTERVAL HISTORY: Hospitalized 12/01-12/03/16 at [facility] with CC of N/V/D, fever and CXR c/w RLL PNA. DC diagnosis was CAP. Treated with levofloxacin (to be completed 12/10) and Prednisone taper  SUBJ: Overall feels much better than when she was hospitalized. Nearly back to baseline. Denies dyspnea above her baseline, cough, sputum, hemoptysis, LE edema, calf tenderness. Continues to smoke 4-5 cigarettes/d   OBJ: Filed Vitals:   03/17/15 1043  BP: 122/60  Pulse: 92  Height: 5' 6.5" (1.689 m)  Weight: 271 lb 3.2 oz (123.016 kg)  SpO2: 90%  3 lpm [place]  Gen: WDWN in NAD HEENT: All WNL Neck: NO LAN, no JVD noted Lungs: full BS, normal percussion note throughout, no wheezes, minimal R basilar crackles Cardiovascular: Reg rate, normal rhythm, no M noted Abdomen: Soft, NT +BS Ext: no C/C/E Neuro: CNs intact, motor/sens grossly intact  DATA: CXR 12/01 and 12/03 both show vague RLL opacification  Outpatient Encounter Prescriptions as of 03/17/2015  Medication Sig  . albuterol (PROVENTIL HFA;VENTOLIN HFA) 108 (90 BASE) MCG/ACT inhaler Inhale 1-2 puffs into the lungs every 6 (six) hours as needed for wheezing or shortness of breath.  Marland Kitchen albuterol (PROVENTIL) (2.5 MG/3ML) 0.083% nebulizer solution Take 3 mLs (2.5 mg total) by nebulization every 6 (six) hours as needed for wheezing or shortness of breath.  Marland Kitchen amLODipine (NORVASC) 5 MG tablet Take 1 tablet by mouth daily.  Marland Kitchen aspirin 81 MG tablet Take 1 tablet (81 mg total) by mouth daily.  . Fluticasone-Salmeterol (ADVAIR DISKUS) 500-50 MCG/DOSE AEPB Inhale 1 puff into the lungs 2 (two) times daily. Rinse mouth after use.  . furosemide (LASIX) 20 MG tablet Take 1 tablet (20 mg total) by mouth 2 (two) times daily.  Marland Kitchen levofloxacin (LEVAQUIN) 500 MG tablet Take 1 tablet (500 mg total) by mouth daily.  . Multiple Vitamin (MULTIVITAMIN) capsule Take 1 capsule by mouth daily.  . nicotine (NICODERM  CQ - DOSED IN MG/24 HOURS) 21 mg/24hr patch Place 1 patch (21 mg total) onto the skin daily.  . OXYGEN Inhale 2 L into the lungs daily. 2 liters daily  . predniSONE (STERAPRED UNI-PAK 21 TAB) 10 MG (21) TBPK tablet Take 1 tablet (10 mg total) by mouth daily. Start at 60 mg, decrease dose by 10 mg every two days. 6 tablets for 2 days, 5 tablets for 2 days, 4 tablets for 2 days, 3 tablets for 2 days, 2 tablets for 2 days, 1 tablet for 2 days and then stop.  . Vitamin D, Cholecalciferol, 1000 UNITS CAPS Take 1,000 Units by mouth daily.   . [DISCONTINUED] aspirin EC 81 MG tablet Take 81 mg by mouth daily.   No facility-administered encounter medications on file as of 03/17/2015.   IMPRESSION: Resolving CAP  Chronic obstructive pulmonary disease - no acute bronchospasm Hypoxemia on chronic O2 Smoker - she raised possibility of initiating Chantix.  PLAN: Complete Levofloxacin and pred taper as prescribed Cont COPD regimen as outlined above Counseled re: smoking cessation strategies ROV 4-6 weeks with Dr Stevenson Clinch. CXR @ that time to ensure clearing of RLL opacification. If unable to quit smoking by that time, reconsider Chantix   Wilhelmina Mcardle, MD [facility]

## 2015-04-22 ENCOUNTER — Ambulatory Visit: Payer: 59 | Admitting: Pulmonary Disease

## 2015-05-07 ENCOUNTER — Other Ambulatory Visit: Payer: Self-pay | Admitting: Family Medicine

## 2015-05-07 ENCOUNTER — Other Ambulatory Visit (HOSPITAL_COMMUNITY)
Admission: RE | Admit: 2015-05-07 | Discharge: 2015-05-07 | Disposition: A | Discharge status: Home / Self Care | Payer: 59 | Source: Ambulatory Visit | Attending: Family Medicine | Admitting: Family Medicine

## 2015-05-07 DIAGNOSIS — Z1151 Encounter for screening for human papillomavirus (HPV): Secondary | ICD-10-CM | POA: Diagnosis present

## 2015-05-07 DIAGNOSIS — Z124 Encounter for screening for malignant neoplasm of cervix: Secondary | ICD-10-CM | POA: Diagnosis present

## 2015-05-08 ENCOUNTER — Ambulatory Visit
Admission: RE | Admit: 2015-05-08 | Discharge: 2015-05-08 | Disposition: A | Discharge status: Home / Self Care | Payer: 59 | Source: Ambulatory Visit | Attending: Pulmonary Disease | Admitting: Pulmonary Disease

## 2015-05-08 ENCOUNTER — Encounter: Payer: Self-pay | Admitting: Pulmonary Disease

## 2015-05-08 ENCOUNTER — Ambulatory Visit (INDEPENDENT_AMBULATORY_CARE_PROVIDER_SITE_OTHER): Payer: 59 | Admitting: Pulmonary Disease

## 2015-05-08 VITALS — BP 132/66 | HR 96 | Wt 280.0 lb

## 2015-05-08 DIAGNOSIS — Z720 Tobacco use: Secondary | ICD-10-CM

## 2015-05-08 DIAGNOSIS — G4733 Obstructive sleep apnea (adult) (pediatric): Secondary | ICD-10-CM

## 2015-05-08 DIAGNOSIS — J189 Pneumonia, unspecified organism: Secondary | ICD-10-CM

## 2015-05-08 DIAGNOSIS — J181 Lobar pneumonia, unspecified organism: Principal | ICD-10-CM

## 2015-05-08 DIAGNOSIS — J310 Chronic rhinitis: Secondary | ICD-10-CM

## 2015-05-08 DIAGNOSIS — J329 Chronic sinusitis, unspecified: Secondary | ICD-10-CM

## 2015-05-08 DIAGNOSIS — J449 Chronic obstructive pulmonary disease, unspecified: Secondary | ICD-10-CM | POA: Diagnosis not present

## 2015-05-08 DIAGNOSIS — F17200 Nicotine dependence, unspecified, uncomplicated: Secondary | ICD-10-CM

## 2015-05-08 DIAGNOSIS — R0902 Hypoxemia: Secondary | ICD-10-CM

## 2015-05-08 DIAGNOSIS — I700 Atherosclerosis of aorta: Secondary | ICD-10-CM | POA: Diagnosis not present

## 2015-05-08 MED ORDER — TRIAMCINOLONE ACETONIDE 55 MCG/ACT NA AERO: 2.0000 | INHALATION_SPRAY | Freq: Every day | NASAL | Status: DC

## 2015-05-08 NOTE — Progress Notes (Signed)
PROBLEMS: COPD Smoker - 4-5 cigs/d as of 03/14/15 OSA Hospitalized 12/01-12/03/16 at [facility] with CC of N/V/D, fever and CXR c/w RLL PNA. DC diagnosis was CAP. Treated with levofloxacin (to be completed 12/10) and Prednisone taper   INTERVAL HISTORY:   SUBJ: Continues to feel well. Complains only of nasal congestion that impedes her ability to tolerate nasal CPAP. Continues to smoke approx 3 cigs/d. Wearing [place] O2 @ 2 lpm   OBJ: Filed Vitals:   05/08/15 1227  BP: 132/66  Pulse: 96  Weight: 280 lb (127.007 kg)  SpO2: 92%    2 lpm   Gen: WDWN in NAD HEENT: All WNL Neck: NO LAN, no JVD noted Lungs: full BS, no wheezes Cardiovascular: Reg rate, normal rhythm, no M noted Abdomen: Obese, soft, NT +BS Ext: no C/C/E Neuro: no focal deficits  DATA: CXR 05/08/15: RLL opacity resolved. NACPD  Outpatient Encounter Prescriptions as of 05/08/2015  Medication Sig  . albuterol (PROVENTIL HFA;VENTOLIN HFA) 108 (90 BASE) MCG/ACT inhaler Inhale 1-2 puffs into the lungs every 6 (six) hours as needed for wheezing or shortness of breath.  Marland Kitchen albuterol (PROVENTIL) (2.5 MG/3ML) 0.083% nebulizer solution Take 3 mLs (2.5 mg total) by nebulization every 6 (six) hours as needed for wheezing or shortness of breath.  Marland Kitchen amLODipine (NORVASC) 5 MG tablet Take 1 tablet by mouth daily.  Marland Kitchen aspirin 81 MG tablet Take 1 tablet (81 mg total) by mouth daily.  . Fluticasone-Salmeterol (ADVAIR DISKUS) 500-50 MCG/DOSE AEPB Inhale 1 puff into the lungs 2 (two) times daily. Rinse mouth after use.  . furosemide (LASIX) 20 MG tablet Take 1 tablet (20 mg total) by mouth 2 (two) times daily.  . Multiple Vitamin (MULTIVITAMIN) capsule Take 1 capsule by mouth daily.  . OXYGEN Inhale 2 L into the lungs daily. 2 liters daily  . Vitamin D, Cholecalciferol, 1000 UNITS CAPS Take 1,000 Units by mouth daily.   . nicotine (NICODERM CQ - DOSED IN MG/24 HOURS) 21 mg/24hr patch Place 1 patch (21 mg total) onto the skin daily. (Patient  not taking: Reported on 05/08/2015)  . [DISCONTINUED] predniSONE (STERAPRED UNI-PAK 21 TAB) 10 MG (21) TBPK tablet Take 1 tablet (10 mg total) by mouth daily. Start at 60 mg, decrease dose by 10 mg every two days. 6 tablets for 2 days, 5 tablets for 2 days, 4 tablets for 2 days, 3 tablets for 2 days, 2 tablets for 2 days, 1 tablet for 2 days and then stop.   No facility-administered encounter medications on file as of 05/08/2015.   IMPRESSION: Recent CAP, clinically and radiographically resolved Chronic obstructive pulmonary disease - no acute bronchospasm Hypoxemia on chronic O2 Smoker - continues to smoke 3 cigs/d. She does not want to start Chantix presently OSA Chronic rhinosinusitis   PLAN: Cont COPD regimen as outlined above Counseled again re: smoking cessation strategies Nasacort 2 sprays per nostril daily ROV 2 months. If unable to quit smoking by that time, reconsider Chantix   Wilhelmina Mcardle, MD [facility]

## 2015-05-11 LAB — CYTOLOGY - PAP

## 2015-06-29 ENCOUNTER — Encounter: Payer: Self-pay | Admitting: Internal Medicine

## 2015-07-03 ENCOUNTER — Ambulatory Visit (INDEPENDENT_AMBULATORY_CARE_PROVIDER_SITE_OTHER): Payer: 59 | Admitting: Internal Medicine

## 2015-07-03 ENCOUNTER — Encounter: Payer: Self-pay | Admitting: Internal Medicine

## 2015-07-03 VITALS — BP 128/70 | HR 109 | Ht 66.0 in | Wt 282.8 lb

## 2015-07-03 DIAGNOSIS — F17200 Nicotine dependence, unspecified, uncomplicated: Secondary | ICD-10-CM

## 2015-07-03 DIAGNOSIS — Z9989 Dependence on other enabling machines and devices: Principal | ICD-10-CM

## 2015-07-03 DIAGNOSIS — J432 Centrilobular emphysema: Secondary | ICD-10-CM | POA: Diagnosis not present

## 2015-07-03 DIAGNOSIS — G4733 Obstructive sleep apnea (adult) (pediatric): Secondary | ICD-10-CM

## 2015-07-03 MED ORDER — VARENICLINE TARTRATE 1 MG PO TABS: ORAL_TABLET | ORAL | Status: DC

## 2015-07-03 NOTE — Assessment & Plan Note (Signed)
OSA - Very Severe, AHI=63.8, 16cmH2O, 2L O2 bleed in at night\with sleep. Discussed sleep data and reviewed with patient.  Encouraged proper weight management.  Excessive weight may contribute to snoring.  Monitor sedative use.  Discussed driving precautions and its relationship with hypersomnolence.  Discussed operating dangerous equipment and its relationship with hypersomnolence.  Discussed sleep hygiene, and benefits of a fixed sleep waked time.  The importance of getting eight or more hours of sleep discussed with patient.  Discussed limiting the use of the computer and television before bedtime.  Decrease naps during the day, so night time sleep will become enhanced.  Limit caffeine, and sleep deprivation.  HTN, stroke, and heart failure are potential risk factors.     Plan: Continue with CPAP 16cm H2O with 2L [place] O2 at night. Patient with 100% compliance, average use 9hrs, average AHI 0.6 - data for 30 days of use

## 2015-07-03 NOTE — Assessment & Plan Note (Signed)
COPD  Plan-  - O2 2L with minimal exertion and with sleep - Advair 500/50 -severe OSA (AHI=63.8) - cpap 16cm H2O - retired from current job.  - continue with gym/lung work program - overlap syndrome (COPD + OSA) - discussed in detailed with patient - tobacco cessation - still smoking, 5 cigarettes per day

## 2015-07-03 NOTE — Progress Notes (Signed)
[facility] Consultation      MRN# [id number] Kelly Hale 06/26/1955   CC: Chief Complaint  Patient presents with  . Follow-up    pt. states breathing has improved. c/o prod cough clear to yellow in color, wheezing X47mo. occ SOB. denies chest pain/tightness.      Brief History:12//3/12- 14 yoF smoker followed for chronic bronchitis, tobacco use ROV 03/18/14 89 yoF former smoker (1ppd/ 30 pk yr) with COPD/bronchitis/asthma complicated by tobacco use, CHF Mother is Kelly Hale Husband here FOLLOWS FOR: chronic respiratory failure, new O2 dependent, also secondary to dCHF. Continues to wear O2 2Lat night through [facility]; states breathing has been doing well since last visit, but has not been wearing O2 much during the day. Upon arrival to the office her O2 sat was noted to be 84% on room air, requiring 2L to get >88%, then upon resting 93% on RA. Would like to have a refill of cough syrup as well. Rare need for Proair. 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 Notes DOE walking house to truck.  Saw cardiology recently (Dr. [name]) who is optimizing her dCHF with BP control and diuresis (Lasix 20mg  daily, may inc to 40mg  for 3lb wt gain or inc leg swelling).  Patient stated that she is on temporary short term disability (works at [name] in the [employer], lifts boxes up to 30lbs during her 8 hr shift), and is supposed to return to work on 03/24/14 when her shot term disability ends, however she is concerned about her ability to perform her current duties. She will require 2L O2 continuously with minimal exertion, and one 15 lb O2 tank will last 3 hours, requiring her to take 2 tanks to work and move around with it, which will be difficult given that she is mobile at work lifting and moving boxes constantly during her shift.  Plan - COPD - on 2L with exertion, advair\prn albuterol, pulm rehab, check split  night study  ROV 05/06/2014 Presents today for a follow up visit of her COPD. Since her last visit she has retired from [employer], and will start a retirement plan shortly. 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  Currently, she has not been wearing her O2 with exertion, only at night, she thought at is what she was supposed to do. Currently she is still smoking 2-4 cigs per day, only wearing nicotine patch 5/7 days Since last visit no ED, urgent or hospitalization for COPD\dyspnea.  Following with cardiology for [facility].  Today upon checkin noted to desat from car to office, saturation was 87%, promptly return to >90% at rest. She has not been to pulm rehab yet, awaiting referral or appointments.  Today would like to discuss the results of her split night study.  Plan-initiate CPAP, stop using tobacco, pulmonary rehabilitation referral, weight loss   ROV 06/2014: Patient presents today for followup visit of her COPD and sleep apnea. She was recently started on CPAP pressure of 16 cm of water, and plaster today show that she's been using it for the last 17 days, 100% compliance, AHI 2.6, average use 8.4 hours per night. Patient states that overall she has increased energy, she feels more awake, does not have as much headaches. Down to smoking 2 cigs per day, still with some stressors at home (sick Uncle). Will start Pulm  rehab on 06/25/14. Plan - tobacco cessation, cpap, pulm rehab, O2 at night and with exertion, start symbicort   ROV 09/2014 Patient presented today for a follow-up visit of her COPD, and OSA. At her last visit she was started on Symbicort, but her insurance would not pay for it She was given Advair 250/50 and advised to take that. Patient thought that she was supposed to be on Advair 500, and has been using Advair 250/50 twice per dose at times. Overall patient  states that she is doing well, she is enjoying pulmonary rehabilitation, she walks about 2.5 miles an hour on the incline machine. She is wearing oxygen 2 L with exertion and at nighttime. She still is smoking about 2-3 cigarettes per day.  Plan-  - O2 2L with minimal exertion and with sleep - Advair 500/50 -severe OSA (AHI=63.8) - cpap 16cm H2O - retired from current job.  - until he would pulmonary rehabilitation - overlap syndrome (COPD + OSA) - discussed in detailed with patient - tobacco cessation - still smoking, down to 2 cigs per day, - Continue with CPAP 16cm H2O with 2L [place] O2 at night.  Events since last clinic visit: Hospitalized 12/01-12/03/16 at [facility] with CC of N/V/D, fever and CXR c/w RLL PNA. DC diagnosis was CAP. Treated with levofloxacin (to be completed 12/10) and Prednisone taper CAP now resolved.  Saw Dr. Alva Garnet for the above HFU. Today doing well Wearing O2 as directed.  Still smoking about 5 cigarettes per day, wean down to oxygen with exertion and at night, using her CPAP machine nightly, compliance report today shows 100%. States she wears CPAP about 8-9 hours per night.  Medication:   Current Outpatient Rx  Name  Route  Sig  Dispense  Refill  . albuterol (PROVENTIL HFA;VENTOLIN HFA) 108 (90 BASE) MCG/ACT inhaler   Inhalation   Inhale 1-2 puffs into the lungs every 6 (six) hours as needed for wheezing or shortness of breath.         Marland Kitchen albuterol (PROVENTIL) (2.5 MG/3ML) 0.083% nebulizer solution   Nebulization   Take 3 mLs (2.5 mg total) by nebulization every 6 (six) hours as needed for wheezing or shortness of breath.   100 vial   6   . amLODipine (NORVASC) 5 MG tablet   Oral   Take 1 tablet by mouth daily.         Marland Kitchen aspirin 81 MG tablet   Oral   Take 1 tablet (81 mg total) by mouth daily.   30 tablet   0   . Fluticasone-Salmeterol (ADVAIR DISKUS) 500-50 MCG/DOSE AEPB   Inhalation   Inhale 1 puff into the lungs 2 (two) times daily. Rinse  mouth after use.   180 each   1   . furosemide (LASIX) 20 MG tablet   Oral   Take 1 tablet (20 mg total) by mouth 2 (two) times daily. Patient taking differently: Take 20 mg by mouth as needed.    180 tablet   3   . Multiple Vitamin (MULTIVITAMIN) capsule   Oral   Take 1 capsule by mouth daily.         . nicotine (NICODERM CQ - DOSED IN MG/24 HOURS) 21 mg/24hr patch   Transdermal   Place 1 patch (21 mg total) onto the skin daily.   28 patch   0   . OXYGEN   Inhalation   Inhale 2 L into the lungs daily. 2 liters daily         .  Vitamin D, Cholecalciferol, 1000 UNITS CAPS   Oral   Take 1,000 Units by mouth daily.          . varenicline (CHANTIX) 1 MG tablet      Days 1-3; 0.5 mg once daily; Days 4-7; 0.5 mg twice daily; Maintenance (Day 8 up to 12 weeks) then 1 mg twice daily   60 tablet   2      Review of Systems  Constitutional: Negative for fever, chills, weight loss and malaise/fatigue.  HENT: Positive for congestion. Negative for hearing loss.   Eyes: Negative for blurred vision and double vision.  Respiratory: Positive for cough and shortness of breath.        Cough improving. Dry  Cardiovascular: Negative for chest pain and palpitations.  Gastrointestinal: Negative for heartburn and nausea.  Genitourinary: Negative for dysuria.  Musculoskeletal: Negative for myalgias and neck pain.  Skin: Negative for rash.  Neurological: Negative for headaches.  Psychiatric/Behavioral: Negative for depression.      Allergies:  Aspirin  Physical Examination:  VS: BP 128/70 mmHg  Pulse 109  Ht 5\' 6"  (1.676 m)  Wt 282 lb 12.8 oz (128.277 kg)  BMI 45.67 kg/m2  SpO2 91%  General Appearance: No distress  HEENT: PERRLA, no ptosis, no other lesions noticed Pulmonary:mild coarse upper airway sounds, mild exp wheeze posteriorly, good respiratory effort.  Cardiovascular:  Normal S1,S2.  No m/r/g.     Abdomen:Exam: Benign, Soft, non-tender, No masses  Skin:   warm,  no rashes, no ecchymosis  Extremities: normal, no cyanosis, clubbing, warm with normal capillary refill.    (The following images and results were reviewed by Dr. Stevenson Clinch on 07/03/2015).  CXR 05/08/15 COMPARISON: 03/14/2015  FINDINGS: Cardiomediastinal silhouette is normal. Mediastinal contours appear intact. Calcified atherosclerotic disease of the aorta is noted.  There is no evidence of focal airspace consolidation, pleural effusion or pneumothorax. Previously seen patchy right lower lobe airspace consolidation has resolved. There is minimal residual Peribronchovascular thickening.  Osseous structures are without acute abnormality. Soft tissues are grossly normal.  IMPRESSION: Near complete resolution of right lower lobe pneumonia, with minimal residual peribronchovascular pulmonary opacities.  Assessment and Plan: TOBACCO USER Tobacco Cessation - Counseling regarding benefits of smoking cessation strategies was provided for more than 12 min. - Educated that at this time smoking- cessation represents the single most important step that patient can take to enhance the length and quality of live. - Educated patient regarding alternatives of behavior interventions, pharmacotherapy including NRT and non-nicotine therapy such, and combinations of both. - Patient at this time: has set a quit date and is agreeable to Chantix - Quit date: 07/20/14, start Chantix one week prior to quit date.  - A Rx for Varenicline, Initial: days 1-3: 0.5 mg once daily; days 4-7: 0.5 mg twice daily. Maintenance (? Day 8): 1 mg twice daily was given. A Rx Bupropion 150 mg once daily for 3 days; increase to 150 mg twice daily; treatment should continue for 7-12 weeks.   COPD with emphysema COPD  Plan-  - O2 2L with minimal exertion and with sleep - Advair 500/50 -severe OSA (AHI=63.8) - cpap 16cm H2O - retired from current job.  - continue with gym/lung work program - overlap syndrome (COPD +  OSA) - discussed in detailed with patient - tobacco cessation - still smoking, 5 cigarettes per day                OSA on CPAP OSA - Very Severe, AHI=63.8, 16cmH2O,  2L O2 bleed in at night\with sleep. Discussed sleep data and reviewed with patient.  Encouraged proper weight management.  Excessive weight may contribute to snoring.  Monitor sedative use.  Discussed driving precautions and its relationship with hypersomnolence.  Discussed operating dangerous equipment and its relationship with hypersomnolence.  Discussed sleep hygiene, and benefits of a fixed sleep waked time.  The importance of getting eight or more hours of sleep discussed with patient.  Discussed limiting the use of the computer and television before bedtime.  Decrease naps during the day, so night time sleep will become enhanced.  Limit caffeine, and sleep deprivation.  HTN, stroke, and heart failure are potential risk factors.     Plan: Continue with CPAP 16cm H2O with 2L [place] O2 at night. Patient with 100% compliance, average use 9hrs, average AHI 0.6 - data for 30 days of use             Updated Medication List Outpatient Encounter Prescriptions as of 07/03/2015  Medication Sig  . albuterol (PROVENTIL HFA;VENTOLIN HFA) 108 (90 BASE) MCG/ACT inhaler Inhale 1-2 puffs into the lungs every 6 (six) hours as needed for wheezing or shortness of breath.  Marland Kitchen albuterol (PROVENTIL) (2.5 MG/3ML) 0.083% nebulizer solution Take 3 mLs (2.5 mg total) by nebulization every 6 (six) hours as needed for wheezing or shortness of breath.  Marland Kitchen amLODipine (NORVASC) 5 MG tablet Take 1 tablet by mouth daily.  Marland Kitchen aspirin 81 MG tablet Take 1 tablet (81 mg total) by mouth daily.  . Fluticasone-Salmeterol (ADVAIR DISKUS) 500-50 MCG/DOSE AEPB Inhale 1 puff into the lungs 2 (two) times daily. Rinse mouth after use.  . furosemide (LASIX) 20 MG tablet Take 1 tablet (20 mg total) by mouth 2 (two) times daily. (Patient taking  differently: Take 20 mg by mouth as needed. )  . Multiple Vitamin (MULTIVITAMIN) capsule Take 1 capsule by mouth daily.  . nicotine (NICODERM CQ - DOSED IN MG/24 HOURS) 21 mg/24hr patch Place 1 patch (21 mg total) onto the skin daily.  . OXYGEN Inhale 2 L into the lungs daily. 2 liters daily  . Vitamin D, Cholecalciferol, 1000 UNITS CAPS Take 1,000 Units by mouth daily.   . varenicline (CHANTIX) 1 MG tablet Days 1-3; 0.5 mg once daily; Days 4-7; 0.5 mg twice daily; Maintenance (Day 8 up to 12 weeks) then 1 mg twice daily  . [DISCONTINUED] triamcinolone (NASACORT AQ) 55 MCG/ACT AERO nasal inhaler Place 2 sprays into the nose daily. (Patient not taking: Reported on 07/03/2015)   No facility-administered encounter medications on file as of 07/03/2015.    Orders for this visit: No orders of the defined types were placed in this encounter.    Thank  you for the visitation and for allowing  [facility] to assist in the care of your patient. Our recommendations are noted above.  Please contact us if we can be of further service.  Vilinda Boehringer, MD [facility] Office Number: [phone]

## 2015-07-03 NOTE — Patient Instructions (Signed)
Follow up with Dr. Stevenson Clinch in:6 months - please try to quit smoking, this will help with you overall breathing and cough - Set a tobacco quit date, once you set this date, then start Chantix (Varenicline) prescription one week prior to tobacco quit date.  - Per our discussion your tobacco quit date is 07/20/15, therefore you should start Chantix on 07/13/15 - A Rx for Varenicline, Initial: days 1-3: 0.5 mg once daily; days 4-7: 0.5 mg twice daily. Maintenance (? Day 8 up to 12 weeks): 1 mg twice daily was given. - cont with CPAP usage and compliance.

## 2015-07-03 NOTE — Assessment & Plan Note (Signed)
Tobacco Cessation - Counseling regarding benefits of smoking cessation strategies was provided for more than 12 min. - Educated that at this time smoking- cessation represents the single most important step that patient can take to enhance the length and quality of live. - Educated patient regarding alternatives of behavior interventions, pharmacotherapy including NRT and non-nicotine therapy such, and combinations of both. - Patient at this time: has set a quit date and is agreeable to Chantix - Quit date: 07/20/14, start Chantix one week prior to quit date.  - A Rx for Varenicline, Initial: days 1-3: 0.5 mg once daily; days 4-7: 0.5 mg twice daily. Maintenance (? Day 8): 1 mg twice daily was given. A Rx Bupropion 150 mg once daily for 3 days; increase to 150 mg twice daily; treatment should continue for 7-12 weeks.

## 2015-08-03 ENCOUNTER — Telehealth: Payer: Self-pay | Admitting: Internal Medicine

## 2015-08-03 NOTE — Telephone Encounter (Signed)
Please advise on the Chantix from message below.

## 2015-08-03 NOTE — Telephone Encounter (Signed)
Pt c/o medication issue:  1. Name of Medication: Chantix  2. How are you currently taking this medication (dosage and times per day)?  Whole pill once both am/pm   3. Are you having a reaction (difficulty breathing--STAT)? No   4. What is your medication issue? Keeping her awake at night.

## 2015-08-03 NOTE — Telephone Encounter (Signed)
Pt informed of VM's response. Nothing further needed.

## 2015-08-03 NOTE — Telephone Encounter (Signed)
Patient can use 1mg  daily from this point on. If still awake at night after 3 days of decreased dose, then just stop the medication.  -VM

## 2015-08-17 ENCOUNTER — Ambulatory Visit (INDEPENDENT_AMBULATORY_CARE_PROVIDER_SITE_OTHER): Payer: 59 | Admitting: Cardiovascular Disease

## 2015-08-17 ENCOUNTER — Encounter: Payer: Self-pay | Admitting: Cardiovascular Disease

## 2015-08-17 VITALS — BP 120/72 | HR 87 | Ht 66.5 in | Wt 288.5 lb

## 2015-08-17 DIAGNOSIS — R079 Chest pain, unspecified: Secondary | ICD-10-CM

## 2015-08-17 DIAGNOSIS — F17200 Nicotine dependence, unspecified, uncomplicated: Secondary | ICD-10-CM

## 2015-08-17 DIAGNOSIS — J432 Centrilobular emphysema: Secondary | ICD-10-CM | POA: Diagnosis not present

## 2015-08-17 DIAGNOSIS — I5030 Unspecified diastolic (congestive) heart failure: Secondary | ICD-10-CM

## 2015-08-17 MED ORDER — FUROSEMIDE 20 MG PO TABS: 20.0000 mg | ORAL_TABLET | Freq: Two times a day (BID) | ORAL | Status: DC

## 2015-08-17 MED ORDER — ROSUVASTATIN CALCIUM 10 MG PO TABS: 10.0000 mg | ORAL_TABLET | Freq: Every day | ORAL | Status: AC

## 2015-08-17 MED ORDER — AMLODIPINE BESYLATE 5 MG PO TABS: 5.0000 mg | ORAL_TABLET | Freq: Every day | ORAL | Status: AC

## 2015-08-17 NOTE — Assessment & Plan Note (Signed)
Reports that she has stopped smoking Feels her breathing is stable Previously seen by pulmonary, reports that she takes albuterol, Advair

## 2015-08-17 NOTE — Assessment & Plan Note (Signed)
We have encouraged continued exercise, careful diet management in an effort to lose weight. Weight is up at least 10 pounds since she stopped smoking

## 2015-08-17 NOTE — Progress Notes (Signed)
Patient ID: Kelly Hale, female    DOB: 1955-07-14, 60 y.o.   MRN: [id number]  HPI Comments: Kelly Hale is a 60 year old woman with morbid obesity, long smoking history, severe COPD, hypertension, obstructive sleep apnea on CPAP, chronic diastolic CHF, previous hospitalization 12/23/2013 at [facility] for acute respiratory failure with acute on chronicdiastolic CHF. She received antibiotics, prednisone taper, aggressive diuresis with improvement of her symptoms. She presents for routine follow-up of her chronic diastolic CHF  In follow-up, she reports that she is doing well. Weight continues to trend upwards. No regular exercise program. Diet has been poor. She did quit smoking, used Chantix. Reported having issues with side effects, stop the Chantix and has not restarted smoking.  Biggest complaint is palpitations at nighttime lasting for several seconds at a time. Only typically happens once, not repetitively Severe stressors at home, concerned she is going to lose her insurance She has retired, [employer] is running out, needs to find alternate [employer]  Reports that she is taking Lasix as needed. Previously was taking Lasix daily Previously seen by pulmonary  EKG on today's visit shows normal sinus rhythm with rate 86 bpm, no significant ST or T-wave changes  Other past medical history Echocardiogram results reviewed from 12/23/2013 showing normal ejection fraction, diastolic dysfunction, normal right heart size and function, normal right ventricular systolic pressure   Allergies  Allergen Reactions  . Aspirin Other (See Comments)    REACTION: intol-asthma Patient takes low dose of aspirin.    Current Outpatient Prescriptions on File Prior to Visit  Medication Sig Dispense Refill  . albuterol (PROVENTIL HFA;VENTOLIN HFA) 108 (90 BASE) MCG/ACT inhaler Inhale 1-2 puffs into the lungs every 6 (six) hours as needed for wheezing or shortness of breath.    Marland Kitchen albuterol  (PROVENTIL) (2.5 MG/3ML) 0.083% nebulizer solution Take 3 mLs (2.5 mg total) by nebulization every 6 (six) hours as needed for wheezing or shortness of breath. 100 vial 6  . aspirin 81 MG tablet Take 1 tablet (81 mg total) by mouth daily. 30 tablet 0  . Fluticasone-Salmeterol (ADVAIR DISKUS) 500-50 MCG/DOSE AEPB Inhale 1 puff into the lungs 2 (two) times daily. Rinse mouth after use. 180 each 1  . Multiple Vitamin (MULTIVITAMIN) capsule Take 1 capsule by mouth daily.    . OXYGEN Inhale 2 L into the lungs daily. 2 liters daily    . Vitamin D, Cholecalciferol, 1000 UNITS CAPS Take 1,000 Units by mouth daily.      No current facility-administered medications on file prior to visit.    Past Medical History  Diagnosis Date  . Asthma   . Hyperlipidemia   . Aspirin allergy   . Morbid obesity ([place])   . Tobacco abuse   . CHF (congestive heart failure) ([place])   . COPD (chronic obstructive pulmonary disease) ([facility])     oxygen at home as needed (& at night)    Past Surgical History  Procedure Laterality Date  . Cesarean section    . Tubal ligation    . Knee arthroscopy      right    Social History  reports that she quit smoking about 4 weeks ago. Her smoking use included Cigarettes. She smoked 0.00 packs per day for 30 years. She has never used smokeless tobacco. She reports that she does not drink alcohol or use illicit drugs.  Family History family history includes Alzheimer's disease in her maternal grandmother; Asthma in her mother; Diabetes in her paternal grandfather; Hyperlipidemia in her  maternal grandfather and mother; Hypertension in her maternal grandfather, maternal grandmother, and mother; Other in her father, maternal uncle, and mother; Stroke in her maternal grandfather.   Review of Systems  Constitutional: Negative.   Respiratory: Positive for shortness of breath.   Cardiovascular: Negative.   Gastrointestinal: Negative.   Endocrine: Negative.   Musculoskeletal: Negative.    Neurological: Negative.   Psychiatric/Behavioral: Negative.   All other systems reviewed and are negative.   BP 120/72 mmHg  Pulse 87  Ht 5' 6.5" (1.689 m)  Wt 288 lb 8 oz (130.863 kg)  BMI 45.87 kg/m2  Physical Exam  Constitutional: She is oriented to person, place, and time. She appears well-developed and well-nourished.  obese  HENT:  Head: Normocephalic.  Nose: Nose normal.  Mouth/Throat: Oropharynx is clear and moist.  Eyes: Conjunctivae are normal. Pupils are equal, round, and reactive to light.  Neck: Normal range of motion. Neck supple. No JVD present.  Cardiovascular: Normal rate, regular rhythm, S1 normal, S2 normal, normal heart sounds and intact distal pulses.  Exam reveals no gallop and no friction rub.   No murmur heard. Pulmonary/Chest: Effort normal. No respiratory distress. She has decreased breath sounds. She has no rales. She exhibits no tenderness.  Abdominal: Soft. Bowel sounds are normal. She exhibits no distension. There is no tenderness.  Musculoskeletal: Normal range of motion. She exhibits no edema or tenderness.  Lymphadenopathy:    She has no cervical adenopathy.  Neurological: She is alert and oriented to person, place, and time. Coordination normal.  Skin: Skin is warm and dry. No rash noted. No erythema.  Psychiatric: She has a normal mood and affect. Her behavior is normal. Judgment and thought content normal.    Assessment and Plan  Nursing note and vitals reviewed.

## 2015-08-17 NOTE — Assessment & Plan Note (Signed)
She has stopped her Lasix for unclear reasons Recommended that she pay close attention to her breathing, ankle swelling and consider restarting Lasix on a more regular basis

## 2015-08-17 NOTE — Assessment & Plan Note (Signed)
We have encouraged her to continue to work on weaning her cigarettes and smoking cessation. She will continue to work on this and does not want any assistance with chantix.  

## 2015-08-17 NOTE — Patient Instructions (Addendum)
You are doing well. No medication changes were made.  Monitor weight closely, take Lasix for weight gain, leg edema, shortness of breath  Please call us if you have new issues that need to be addressed before your next appt.  Your physician wants you to follow-up in: 6 months.  You will receive a reminder letter in the mail two months in advance. If you don't receive a letter, please call our office to schedule the follow-up appointment.

## 2015-08-26 ENCOUNTER — Other Ambulatory Visit: Payer: Self-pay | Admitting: *Deleted

## 2015-08-26 MED ORDER — FLUTICASONE-SALMETEROL 500-50 MCG/DOSE IN AEPB: 1.0000 | INHALATION_SPRAY | Freq: Two times a day (BID) | RESPIRATORY_TRACT | Status: DC

## 2015-09-01 ENCOUNTER — Telehealth: Payer: Self-pay | Admitting: Internal Medicine

## 2015-09-01 MED ORDER — ALBUTEROL SULFATE HFA 108 (90 BASE) MCG/ACT IN AERS: 1.0000 | INHALATION_SPRAY | Freq: Four times a day (QID) | RESPIRATORY_TRACT | Status: DC | PRN

## 2015-09-01 MED ORDER — ALBUTEROL SULFATE HFA 108 (90 BASE) MCG/ACT IN AERS: 1.0000 | INHALATION_SPRAY | Freq: Four times a day (QID) | RESPIRATORY_TRACT | Status: AC | PRN

## 2015-09-01 NOTE — Telephone Encounter (Signed)
°*  STAT* If patient is at the pharmacy, call can be transferred to refill team.   1. Which medications need to be refilled? (please list name of each medication and dose if known)  ProAir (her emergency inhaler )  2. Which pharmacy/location (including street and city if local pharmacy) is medication to be sent to? [facility] in [place]   3. Do they need a 30 day or 90 day supply? 90 day

## 2015-09-01 NOTE — Telephone Encounter (Signed)
RX sent to pharmacy. Pt informed. Nothing further needed

## 2015-12-22 ENCOUNTER — Encounter: Payer: Self-pay | Admitting: Internal Medicine

## 2015-12-25 ENCOUNTER — Ambulatory Visit (INDEPENDENT_AMBULATORY_CARE_PROVIDER_SITE_OTHER): Payer: BLUE CROSS/BLUE SHIELD | Admitting: Internal Medicine

## 2015-12-25 ENCOUNTER — Encounter: Payer: Self-pay | Admitting: Internal Medicine

## 2015-12-25 VITALS — BP 130/74 | HR 95 | Ht 66.0 in | Wt 309.6 lb

## 2015-12-25 DIAGNOSIS — J432 Centrilobular emphysema: Secondary | ICD-10-CM | POA: Diagnosis not present

## 2015-12-25 DIAGNOSIS — J441 Chronic obstructive pulmonary disease with (acute) exacerbation: Secondary | ICD-10-CM

## 2015-12-25 DIAGNOSIS — G4733 Obstructive sleep apnea (adult) (pediatric): Secondary | ICD-10-CM | POA: Diagnosis not present

## 2015-12-25 DIAGNOSIS — Z720 Tobacco use: Secondary | ICD-10-CM

## 2015-12-25 DIAGNOSIS — Z9989 Dependence on other enabling machines and devices: Secondary | ICD-10-CM

## 2015-12-25 DIAGNOSIS — F17210 Nicotine dependence, cigarettes, uncomplicated: Secondary | ICD-10-CM

## 2015-12-25 NOTE — Assessment & Plan Note (Signed)
Tobacco Cessation - Counseling regarding benefits of smoking cessation strategies was provided for more than 3 min. - Educated that at this time smoking- cessation represents the single most important step that patient can take to enhance the length and quality of live. - Educated patient regarding alternatives of behavior interventions, pharmacotherapy including NRT and non-nicotine therapy such, and combinations of both. - Patient at this time has quit since 07/2015, still exposed to some 2nd hand smoking from daughter.

## 2015-12-25 NOTE — Patient Instructions (Addendum)
Follow up with Dr. Stevenson Clinch in:6 months - cont with CPAP - cont with tobacco cessation - cont with tobacco avoidance, forms - 2nd hand smoke, ecigs, vapors, etc - restart diet and exercise as tolerated - cont with current inhalers.  - we will give you coupons and paperwork for med assistance for you inhalers.

## 2015-12-25 NOTE — Progress Notes (Signed)
[facility] Consultation      MRN# [id number] Kelly Hale 04-15-1955   CC: Chief Complaint  Patient presents with  . Follow-up    73mo rov.  pt states breathing is basline, pt c/o sob w/exertion, occ non prod cough & occ chest discomfort. wearing cpap avg 9hr nightly, feels mask & pressure are ok. Kelly Hale      Brief History:12//3/12- 10 yoF smoker followed Hale chronic bronchitis, tobacco use ROV 03/18/14 47 yoF former smoker (1ppd/ 30 pk yr) with COPD/bronchitis/asthma complicated by tobacco use, CHF [name] Kelly Hale here Kelly Hale: chronic respiratory failure, new O2 dependent, also secondary to dCHF. Continues to wear O2 2Lat night through [facility]; states breathing has been doing well since last visit, but has not been wearing O2 much during the day. Upon arrival to the office her O2 sat was noted to be 84% on room air, requiring 2L to get >88%, then upon resting 93% on RA. Would like to have a refill of cough syrup as well. Rare need Hale Proair. 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 Notes DOE walking house to truck.  Saw cardiology recently (Kelly Hale) who is optimizing her dCHF with BP control and diuresis (Lasix 20mg  daily, may inc to 40mg  Hale 3lb wt gain or inc leg swelling).  Patient stated that she is on temporary short term disability (works at [name] in the [employer], lifts boxes up to 30lbs during her 8 hr shift), and is supposed to return to work on 03/24/14 when her shot term disability ends, however she is concerned about her ability to perform her current duties. She will require 2L O2 continuously with minimal exertion, and one 15 lb O2 tank will last 3 hours, requiring her to take 2 tanks to work and move around with it, which will be difficult given that she is mobile at work lifting and moving boxes constantly during her shift.  Plan - COPD - on 2L with exertion,  advair\prn albuterol, pulm rehab, check split night study  ROV 05/06/2014 Presents today Hale a follow up visit of her COPD. Since her last visit she has retired from [employer] Hale, and will start a retirement plan shortly. 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  Currently, she has not been wearing her O2 with exertion, only at night, she thought at is what she was supposed to do. Currently she is still smoking 2-4 cigs per day, only wearing nicotine patch 5/7 days Since last visit no ED, urgent or hospitalization Hale COPD\dyspnea.  Following with cardiology Hale Kelly Hale.  Today upon checkin noted to desat from car to office, saturation was 87%, promptly return to >90% at rest. She has not been to pulm rehab yet, awaiting referral or appointments.  Today would like to discuss the results of her split night study.  Plan-initiate CPAP, stop using tobacco, pulmonary rehabilitation referral, weight loss    Events since last clinic visit: She presents today Hale a followup visit, Hale COPD and OSA. Her OSA compliance report today shows 100% compliance with average AHI of 0.6. Patient states that she's doing well pulmonary wise. She denies any fever, chills, worsening shortness of breath. She is wearing O2 with exertion. The patient states she's gained about 20-25 pounds since she stopped smoking in April. Her PMD has expressed concern about adjusting  her CPAP settings. However, with average AHI of less than 5 on CPAP, patient is well controlled. Medication:    Current Outpatient Prescriptions:  .  albuterol (PROVENTIL HFA;VENTOLIN HFA) 108 (90 Base) MCG/ACT inhaler, Inhale 1-2 puffs into the lungs every 6 (six) hours as needed Hale wheezing or shortness of breath., Disp: 3 Inhaler, Rfl: 0 .  albuterol (PROVENTIL) (2.5 MG/3ML) 0.083% nebulizer solution, Take 3 mLs (2.5 mg total) by  nebulization every 6 (six) hours as needed Hale wheezing or shortness of breath., Disp: 100 vial, Rfl: 6 .  amLODipine (NORVASC) 5 MG tablet, Take 1 tablet (5 mg total) by mouth daily., Disp: 90 tablet, Rfl: 3 .  aspirin 81 MG tablet, Take 1 tablet (81 mg total) by mouth daily., Disp: 30 tablet, Rfl: 0 .  Fluticasone-Salmeterol (ADVAIR DISKUS) 500-50 MCG/DOSE AEPB, Inhale 1 puff into the lungs 2 (two) times daily. Rinse mouth after use., Disp: 180 each, Rfl: 1 .  furosemide (LASIX) 20 MG tablet, Take 1 tablet (20 mg total) by mouth 2 (two) times daily., Disp: 180 tablet, Rfl: 3 .  Multiple Vitamin (MULTIVITAMIN) capsule, Take 1 capsule by mouth daily., Disp: , Rfl:  .  OXYGEN, Inhale 2 L into the lungs daily. 2 liters daily, Disp: , Rfl:  .  rosuvastatin (CRESTOR) 10 MG tablet, Take 1 tablet (10 mg total) by mouth daily., Disp: 90 tablet, Rfl: 3 .  Vitamin D, Cholecalciferol, 1000 UNITS CAPS, Take 1,000 Units by mouth daily. , Disp: , Rfl:     Review of Systems  Constitutional: Negative Hale chills, fever, malaise/fatigue and weight loss.  HENT: Positive Hale congestion. Negative Hale hearing loss.   Eyes: Negative Hale blurred vision and double vision.  Respiratory: Positive Hale cough and shortness of breath.        Cough improving. Dry  Cardiovascular: Negative Hale chest pain and palpitations.  Gastrointestinal: Negative Hale heartburn and nausea.  Genitourinary: Negative Hale dysuria.  Musculoskeletal: Negative Hale myalgias and neck pain.  Skin: Negative Hale rash.  Neurological: Negative Hale headaches.  Psychiatric/Behavioral: Negative Hale depression.      Allergies:  Aspirin  Physical Examination:  VS: BP 130/74 (BP Location: Left Wrist, Cuff Size: Normal)   Pulse 95   Ht 5\' 6"  (1.676 m)   Wt (!) 309 lb 9.6 oz (140.4 kg)   SpO2 94%   BMI 49.97 kg/m   General Appearance: No distress  HEENT: PERRLA, no ptosis, no other lesions noticed Pulmonary:mild coarse upper airway sounds,  mild exp wheeze posteriorly, good respiratory effort.  Cardiovascular:  Normal S1,S2.  No m/r/g.     Abdomen:Exam: Benign, Soft, non-tender, No masses  Skin:   warm, no rashes, no ecchymosis  Extremities: normal, no cyanosis, clubbing, warm with normal capillary refill.    (The following images and results were reviewed by Dr. Stevenson Clinch on 12/25/2015).  CXR 05/08/15 COMPARISON: 03/14/2015  FINDINGS: Cardiomediastinal silhouette is normal. Mediastinal contours appear intact. Calcified atherosclerotic disease of the aorta is noted.  There is no evidence of focal airspace consolidation, pleural effusion or pneumothorax. Previously seen patchy right lower lobe airspace consolidation has resolved. There is minimal residual Peribronchovascular thickening.  Osseous structures are without acute abnormality. Soft tissues are grossly normal.  IMPRESSION: Near complete resolution of right lower lobe pneumonia, with minimal residual peribronchovascular pulmonary opacities.  Assessment and Plan: Cigarette smoker Tobacco Cessation - Counseling regarding benefits of smoking cessation strategies was provided Hale more than 3 min. - Educated that at this time smoking- cessation  represents the single most important step that patient can take to enhance the length and quality of live. - Educated patient regarding alternatives of behavior interventions, pharmacotherapy including NRT and non-nicotine therapy such, and combinations of both. - Patient at this time has quit since 07/2015, still exposed to some 2nd hand smoking from daughter.     OSA on CPAP OSA - Very Severe, AHI=63.8, 16cmH2O, 2L O2 bleed in at night\with sleep. Discussed sleep data and reviewed with patient.  Encouraged proper weight management.  Excessive weight may contribute to snoring.  Monitor sedative use.  Discussed driving precautions and its relationship with hypersomnolence.  Discussed operating dangerous equipment and  its relationship with hypersomnolence.  Discussed sleep hygiene, and benefits of a fixed sleep waked time.  The importance of getting eight or more hours of sleep discussed with patient.  Discussed limiting the use of the computer and television before bedtime.  Decrease naps during the day, so night time sleep will become enhanced.  Limit caffeine, and sleep deprivation.  HTN, stroke, and heart failure are potential risk factors.     Plan: Continue with CPAP 16cm H2O with 2L [place] O2 at night. Patient with 100% compliance, average use 9hrs, average AHI 0.6 - data Hale 30 days of use At this time given the low AHI, there is no need Hale further adjustment of CPAP. Weight gain could be due to diet and lack of exercise. Patient OSA is well controlled.           COPD with emphysema (HCC) COPD  Plan-  - O2 2L with minimal exertion and with sleep - Advair 500/50 -severe OSA (AHI=63.8) - cpap 16cm H2O - retired from current job.  - continue with gym/lung work program - overlap syndrome (COPD + OSA) - discussed in detailed with patient - tobacco cessation - still smoking, 5 cigarettes per day                Morbid obesity Discussed importance of weight reduction.  Educated regarding limitation of  intake of greasy/fried foods.  Instructed on benefit of  a low-impact exercise program, starting slowly.  Discussed benefits of 30-45 minutes of some form of exercise daily as well as benefit of supervised exercise program.  Her CPAP is well controlled. There is some association with stopping tobacco use and weight gain, however this only accounts Hale about 10% of weight gain, and poor dietary habits, immobility, and lack of exercise accounts Hale the rest of weight gain that former smokers can obtain.    Updated Medication List Outpatient Encounter Prescriptions as of 12/25/2015  Medication Sig  . albuterol (PROVENTIL HFA;VENTOLIN HFA) 108 (90 Base) MCG/ACT inhaler Inhale 1-2  puffs into the lungs every 6 (six) hours as needed Hale wheezing or shortness of breath.  Marland Kitchen albuterol (PROVENTIL) (2.5 MG/3ML) 0.083% nebulizer solution Take 3 mLs (2.5 mg total) by nebulization every 6 (six) hours as needed Hale wheezing or shortness of breath.  Marland Kitchen amLODipine (NORVASC) 5 MG tablet Take 1 tablet (5 mg total) by mouth daily.  Marland Kitchen aspirin 81 MG tablet Take 1 tablet (81 mg total) by mouth daily.  . Fluticasone-Salmeterol (ADVAIR DISKUS) 500-50 MCG/DOSE AEPB Inhale 1 puff into the lungs 2 (two) times daily. Rinse mouth after use.  . furosemide (LASIX) 20 MG tablet Take 1 tablet (20 mg total) by mouth 2 (two) times daily.  . Multiple Vitamin (MULTIVITAMIN) capsule Take 1 capsule by mouth daily.  . OXYGEN Inhale 2 L into the lungs  daily. 2 liters daily  . rosuvastatin (CRESTOR) 10 MG tablet Take 1 tablet (10 mg total) by mouth daily.  . Vitamin D, Cholecalciferol, 1000 UNITS CAPS Take 1,000 Units by mouth daily.    No facility-administered encounter medications on file as of 12/25/2015.     Orders Hale this visit: No orders of the defined types were placed in this encounter.   Thank  you Hale the visitation and Hale allowing  [facility] to assist in the care of your patient. Our recommendations are noted above.  Please contact us if we can be of further service.  Vilinda Boehringer, MD  Pulmonary and Critical Care Office Number: [phone]

## 2015-12-29 NOTE — Assessment & Plan Note (Signed)
Discussed importance of weight reduction.  Educated regarding limitation of  intake of greasy/fried foods.  Instructed on benefit of  a low-impact exercise program, starting slowly.  Discussed benefits of 30-45 minutes of some form of exercise daily as well as benefit of supervised exercise program.  Her CPAP is well controlled. There is some association with stopping tobacco use and weight gain, however this only accounts for about 10% of weight gain, and poor dietary habits, immobility, and lack of exercise accounts for the rest of weight gain that former smokers can obtain.

## 2015-12-29 NOTE — Assessment & Plan Note (Signed)
COPD  Plan-  - O2 2L with minimal exertion and with sleep - Advair 500/50 -severe OSA (AHI=63.8) - cpap 16cm H2O - retired from current job.  - continue with gym/lung work program - overlap syndrome (COPD + OSA) - discussed in detailed with patient - tobacco cessation - still smoking, 5 cigarettes per day

## 2015-12-29 NOTE — Assessment & Plan Note (Signed)
OSA - Very Severe, AHI=63.8, 16cmH2O, 2L O2 bleed in at night\with sleep. Discussed sleep data and reviewed with patient.  Encouraged proper weight management.  Excessive weight may contribute to snoring.  Monitor sedative use.  Discussed driving precautions and its relationship with hypersomnolence.  Discussed operating dangerous equipment and its relationship with hypersomnolence.  Discussed sleep hygiene, and benefits of a fixed sleep waked time.  The importance of getting eight or more hours of sleep discussed with patient.  Discussed limiting the use of the computer and television before bedtime.  Decrease naps during the day, so night time sleep will become enhanced.  Limit caffeine, and sleep deprivation.  HTN, stroke, and heart failure are potential risk factors.     Plan: Continue with CPAP 16cm H2O with 2L [place] O2 at night. Patient with 100% compliance, average use 9hrs, average AHI 0.6 - data for 30 days of use At this time given the low AHI, there is no need for further adjustment of CPAP. Weight gain could be due to diet and lack of exercise. Patient OSA is well controlled.

## 2015-12-30 ENCOUNTER — Telehealth: Payer: Self-pay | Admitting: *Deleted

## 2015-12-30 NOTE — Telephone Encounter (Signed)
Per pt's insurance [employer] Breo 200 30 day supply is $40 or 90 day $120.  Pt ID: [id number] Rx Help desk # [phone].

## 2015-12-30 NOTE — Telephone Encounter (Signed)
Per VM checked to see how much Breo 200 would cost pt. Per VM pt can have Breo 200 sent in vs Advair. Pt wis calling PCP to make sure she hasn't ever tried [employer]. If pt wants can give sample of Breo 200 to try and if works can send rx to pharmacy per VM. Informed pt and she will call back to let us know. Nothing further needed at this time.

## 2016-02-12 ENCOUNTER — Ambulatory Visit (INDEPENDENT_AMBULATORY_CARE_PROVIDER_SITE_OTHER): Payer: BLUE CROSS/BLUE SHIELD | Admitting: Cardiovascular Disease

## 2016-02-12 ENCOUNTER — Encounter: Payer: Self-pay | Admitting: Cardiovascular Disease

## 2016-02-12 VITALS — BP 122/68 | HR 95 | Ht 66.5 in | Wt 313.2 lb

## 2016-02-12 DIAGNOSIS — R0602 Shortness of breath: Secondary | ICD-10-CM

## 2016-02-12 DIAGNOSIS — G4733 Obstructive sleep apnea (adult) (pediatric): Secondary | ICD-10-CM | POA: Diagnosis not present

## 2016-02-12 DIAGNOSIS — J432 Centrilobular emphysema: Secondary | ICD-10-CM

## 2016-02-12 DIAGNOSIS — Z23 Encounter for immunization: Secondary | ICD-10-CM

## 2016-02-12 DIAGNOSIS — F17200 Nicotine dependence, unspecified, uncomplicated: Secondary | ICD-10-CM

## 2016-02-12 DIAGNOSIS — Z9989 Dependence on other enabling machines and devices: Secondary | ICD-10-CM

## 2016-02-12 DIAGNOSIS — I5032 Chronic diastolic (congestive) heart failure: Secondary | ICD-10-CM | POA: Diagnosis not present

## 2016-02-12 NOTE — Progress Notes (Signed)
Cardiology Office Note  Date:  02/12/2016   ID:  Kelly Hale, DOB Jan 01, 1956, MRN [id number]  PCP:  Vidal Schwalbe, MD   Chief Complaint  Patient presents with  . other    6 month follow up. Meds reviewed by the pt. verbally. Pt. c/o shortness of breath.     HPI:   Kelly Hale is a 60 year old woman with morbid obesity, long smoking history, severe COPD, hypertension, obstructive sleep apnea on CPAP, chronic diastolic CHF, previous hospitalization 12/23/2013 at [facility] for acute respiratory failure with acute on chronic diastolic CHF. She received antibiotics, prednisone taper, aggressive diuresis with improvement of her symptoms. She presents for routine follow-up of her chronic diastolic CHF  In follow-up, she has stopped smoking one year ago Weight up around 25 pounds Reports having worsening shortness of breath, not exercising, deconditioned Uses oxygen particularly with heavy exertion She is interested in weight loss programs. Has a friend having gastric bypass surgery  Denies having significant palpitations Last clinic visit, had nighttime palpitations, rare She takes Lasix on a consistent basis daily Followed by pulmonary, on inhalers  EKG on today's visit shows normal sinus rhythm with rate 95 bpm, no significant ST or T-wave changes  Other past medical history Echocardiogram results reviewed from 12/23/2013 showing normal ejection fraction, diastolic dysfunction, normal right heart size and function, normal right ventricular systolic pressure PMH:   has a past medical history of Aspirin allergy; Asthma; CHF (congestive heart failure) ([place]); COPD (chronic obstructive pulmonary disease) ([place]); Hyperlipidemia; Morbid obesity ([place]); and Tobacco abuse.  PSH:    Past Surgical History:  Procedure Laterality Date  . CESAREAN SECTION    . KNEE ARTHROSCOPY     right  . TUBAL LIGATION      Current Outpatient Prescriptions  Medication Sig Dispense Refill  . albuterol  (PROVENTIL HFA;VENTOLIN HFA) 108 (90 Base) MCG/ACT inhaler Inhale 1-2 puffs into the lungs every 6 (six) hours as needed for wheezing or shortness of breath. 3 Inhaler 0  . albuterol (PROVENTIL) (2.5 MG/3ML) 0.083% nebulizer solution Take 3 mLs (2.5 mg total) by nebulization every 6 (six) hours as needed for wheezing or shortness of breath. 100 vial 6  . amLODipine (NORVASC) 5 MG tablet Take 1 tablet (5 mg total) by mouth daily. 90 tablet 3  . aspirin 81 MG tablet Take 1 tablet (81 mg total) by mouth daily. 30 tablet 0  . Fluticasone-Salmeterol (ADVAIR DISKUS) 500-50 MCG/DOSE AEPB Inhale 1 puff into the lungs 2 (two) times daily. Rinse mouth after use. 180 each 1  . furosemide (LASIX) 20 MG tablet Take 1 tablet (20 mg total) by mouth 2 (two) times daily. 180 tablet 3  . Multiple Vitamin (MULTIVITAMIN) capsule Take 1 capsule by mouth daily.    . OXYGEN Inhale 2 L into the lungs daily. 2 liters daily    . rosuvastatin (CRESTOR) 10 MG tablet Take 1 tablet (10 mg total) by mouth daily. 90 tablet 3  . Vitamin D, Cholecalciferol, 1000 UNITS CAPS Take 1,000 Units by mouth daily.      No current facility-administered medications for this visit.      Allergies:   Aspirin   Social History:  The patient  reports that she quit smoking about 6 months ago. Her smoking use included Cigarettes. She smoked 0.00 packs per day for 30.00 years. She has never used smokeless tobacco. She reports that she does not drink alcohol or use drugs.   Family History:   family history includes Alzheimer's disease  in her maternal grandmother; Asthma in her mother; Diabetes in her paternal grandfather; Hyperlipidemia in her maternal grandfather and mother; Hypertension in her maternal grandfather, maternal grandmother, and mother; Other in her father, maternal uncle, and mother; Stroke in her maternal grandfather.    Review of Systems: Review of Systems  Constitutional: Negative.   Respiratory: Negative.   Cardiovascular:  Negative.   Gastrointestinal: Negative.   Musculoskeletal: Negative.   Neurological: Negative.   Psychiatric/Behavioral: Negative.   All other systems reviewed and are negative.    PHYSICAL EXAM: VS:  BP 122/68 (BP Location: Left Arm, Patient Position: Sitting, Cuff Size: Large)   Pulse 95   Ht 5' 6.5" (1.689 m)   Wt (!) 313 lb 4 oz (142.1 kg)   BMI 49.80 kg/m  , BMI Body mass index is 49.8 kg/m. GEN: Well nourished, well developed, in no acute distress , obese HEENT: normal  Neck: no JVD, carotid bruits, or masses Cardiac: RRR; no murmurs, rubs, or gallops,no edema  Respiratory:  clear to auscultation bilaterally, normal work of breathing GI: soft, nontender, nondistended, + BS MS: no deformity or atrophy  Skin: warm and dry, no rash Neuro:  Strength and sensation are intact Psych: euthymic mood, full affect    Recent Labs: 03/12/2015: ALT 14; B Natriuretic Peptide 163.0; Hemoglobin 14.6; Platelets 274 03/14/2015: BUN 15; Creatinine, Ser 0.57; Potassium 3.9; Sodium 139    Lipid Panel Lab Results  Component Value Date   CHOL 128 12/24/2013   HDL 50 12/24/2013   LDLCALC 65 12/24/2013   TRIG 65 12/24/2013      Wt Readings from Last 3 Encounters:  02/12/16 (!) 313 lb 4 oz (142.1 kg)  12/25/15 (!) 309 lb 9.6 oz (140.4 kg)  08/17/15 288 lb 8 oz (130.9 kg)       ASSESSMENT AND PLAN:  SOB (shortness of breath) - Plan: EKG 12-Lead Shortness of breath likely multifactorial Biggest contributor is her weight,  Underlying COPD from years of smoking Small component of chronic diastolic CHF, managed with Lasix daily  Encounter for immunization - Plan: Flu Vaccine QUAD 36+ mos IM Flu shot provided  Centrilobular emphysema ([place]) Followed by pulmonary, on inhalers. Stable symptoms  TOBACCO USER Stop smoking one year ago  Morbid obesity ([place]) Discussed various treatment strategies with her including low carbohydrate diets, dietary guide provided. Also discussed  other options such as gastric bypass surgery  OSA on CPAP - Plan: EKG 12-Lead Reports she is compliant with her CPAP at night, sleeps well  Chronic diastolic CHF (congestive heart failure) ([place]) - Plan: EKG 12-Lead continue her on the same Lasix regiment. Discussed that she should increase her Lasix for worsening ankle swelling, hand swelling, shortness of breath symptoms. Could increase up to 40 mg twice a day temporarily until symptoms improve   Total encounter time more than 25 minutes  Greater than 50% was spent in counseling and coordination of care with the patient   Disposition:   F/U  12 months   Orders Placed This Encounter  Procedures  . Flu Vaccine QUAD 36+ mos IM  . EKG 12-Lead     Signed, Esmond Plants, M.D., Ph.D. 02/12/2016  [place], [place]

## 2016-02-12 NOTE — Patient Instructions (Addendum)
Medication Instructions:   No medication changes made  Ask primary care about phenteramine  Labwork:  No new labs needed  Testing/Procedures:  No further testing at this time   Follow-Up: It was a pleasure seeing you in the office today. Please call us if you have new issues that need to be addressed before your next appt.  [phone]  Your physician wants you to follow-up in: 12 months.  You will receive a reminder letter in the mail two months in advance. If you don't receive a letter, please call our office to schedule the follow-up appointment.  If you need a refill on your cardiac medications before your next appointment, please call your pharmacy.

## 2016-03-08 ENCOUNTER — Telehealth: Payer: Self-pay | Admitting: Internal Medicine

## 2016-03-08 MED ORDER — FLUTICASONE FUROATE-VILANTEROL 200-25 MCG/INH IN AEPB: 1.0000 | INHALATION_SPRAY | Freq: Every day | RESPIRATORY_TRACT | 3 refills | Status: DC

## 2016-03-08 NOTE — Telephone Encounter (Signed)
Pt states she would like a rx for Breo to [place]

## 2016-03-08 NOTE — Telephone Encounter (Signed)
Pt informed [facility] has been sent to the pharmacy. Nothing further needed.

## 2016-03-09 ENCOUNTER — Telehealth: Payer: Self-pay | Admitting: Internal Medicine

## 2016-03-09 NOTE — Telephone Encounter (Signed)
Please call patient regarding directions on her Breo inhaler.

## 2016-03-09 NOTE — Telephone Encounter (Signed)
Pt wanted to verify that the Breo is 1 puff daily. States the pharmacy told her it is suppose to be 2 puffs. Informed pt 1 puff daily and to try and take it around the same time daily. Nothing further needed.

## 2016-03-10 ENCOUNTER — Telehealth: Payer: Self-pay | Admitting: Internal Medicine

## 2016-03-10 NOTE — Assessment & Plan Note (Signed)
Disregard

## 2016-03-10 NOTE — Telephone Encounter (Signed)
Spoke with pt and [facility] and they will fix her inhalers to get 3 month supply. Pt agreed. Nothing further needed.

## 2016-03-10 NOTE — Telephone Encounter (Signed)
Pt calling stating that [facility] [place] only [facility] on her inhaler for about 2 months and we told her it would be used up until 4 months Would like to see if we can get this corrected  Please advise.

## 2016-03-10 NOTE — Progress Notes (Signed)
erro  neous encounter

## 2016-05-10 ENCOUNTER — Telehealth: Payer: Self-pay | Admitting: Pulmonary Disease

## 2016-05-10 MED ORDER — PREDNISONE 10 MG PO TABS: ORAL_TABLET | ORAL | 0 refills | Status: DC

## 2016-05-10 MED ORDER — PREDNISONE 10 MG (21) PO TBPK: ORAL_TABLET | ORAL | 0 refills | Status: DC

## 2016-05-10 MED ORDER — AZITHROMYCIN 250 MG PO TABS: ORAL_TABLET | ORAL | 0 refills | Status: DC

## 2016-05-10 NOTE — Telephone Encounter (Signed)
Pt aware of DR's recommendations. Rx sent to preferred pharmacy. Nothing further needed

## 2016-05-10 NOTE — Telephone Encounter (Signed)
1. Zpack.  2. Prednisone 10 mg tabs x 21.  Take 6 tablets on day 1 Take 5 tablets on day 2 Take 4 tablets on day 3 Take 3 tablets on day 4 Take 2 tablets on day 5 Take 1 tablet on day 6 then stop.

## 2016-05-10 NOTE — Telephone Encounter (Signed)
[name] calling states she is wheezing, coughing, and has to be on her oxygen 24/7. Please call.

## 2016-05-10 NOTE — Telephone Encounter (Signed)
Spoke with pt, who states her O2 level has been as low as 75. [name] having to use her O2 continuous instead of qhs. Pt c/o prod cough with clear mucus, wheezing & increased sob X 1wk Denies any fever, chills or sweats. [name] requesting recommendations.  DR please advise. Thanks.

## 2016-06-21 DIAGNOSIS — Z114 Encounter for screening for human immunodeficiency virus [HIV]: Secondary | ICD-10-CM | POA: Diagnosis not present

## 2016-06-21 DIAGNOSIS — I10 Essential (primary) hypertension: Secondary | ICD-10-CM | POA: Diagnosis not present

## 2016-06-21 DIAGNOSIS — J449 Chronic obstructive pulmonary disease, unspecified: Secondary | ICD-10-CM | POA: Diagnosis not present

## 2016-06-21 DIAGNOSIS — E785 Hyperlipidemia, unspecified: Secondary | ICD-10-CM | POA: Diagnosis not present

## 2016-06-21 DIAGNOSIS — E559 Vitamin D deficiency, unspecified: Secondary | ICD-10-CM | POA: Diagnosis not present

## 2016-06-21 DIAGNOSIS — F17201 Nicotine dependence, unspecified, in remission: Secondary | ICD-10-CM | POA: Diagnosis not present

## 2016-07-06 ENCOUNTER — Telehealth: Payer: Self-pay | Admitting: Pulmonary Disease

## 2016-07-06 NOTE — Telephone Encounter (Signed)
Pt calling stating she has a head cold Using her nasal spray  But doesn't seem to be getting any better Please advise

## 2016-07-06 NOTE — Telephone Encounter (Signed)
Pt states her head very stuffy worse in am. Does have sore throat. Denies fever, cough. Pt already using the OTC nasal spray that she was advised to use by VM. Please advise.

## 2016-07-07 ENCOUNTER — Encounter: Payer: Self-pay | Admitting: Emergency Medicine

## 2016-07-07 ENCOUNTER — Emergency Department
Admission: EM | Admit: 2016-07-07 | Discharge: 2016-07-07 | Disposition: A | Discharge status: Home / Self Care | Payer: Medicare HMO | Attending: Emergency Medicine | Admitting: Emergency Medicine

## 2016-07-07 ENCOUNTER — Emergency Department: Payer: Medicare HMO

## 2016-07-07 DIAGNOSIS — J449 Chronic obstructive pulmonary disease, unspecified: Secondary | ICD-10-CM | POA: Diagnosis not present

## 2016-07-07 DIAGNOSIS — R0600 Dyspnea, unspecified: Secondary | ICD-10-CM | POA: Diagnosis not present

## 2016-07-07 DIAGNOSIS — Z87891 Personal history of nicotine dependence: Secondary | ICD-10-CM | POA: Insufficient documentation

## 2016-07-07 DIAGNOSIS — I509 Heart failure, unspecified: Secondary | ICD-10-CM | POA: Insufficient documentation

## 2016-07-07 DIAGNOSIS — J45909 Unspecified asthma, uncomplicated: Secondary | ICD-10-CM | POA: Diagnosis not present

## 2016-07-07 DIAGNOSIS — R05 Cough: Secondary | ICD-10-CM | POA: Diagnosis not present

## 2016-07-07 DIAGNOSIS — Z7982 Long term (current) use of aspirin: Secondary | ICD-10-CM | POA: Diagnosis not present

## 2016-07-07 DIAGNOSIS — R0602 Shortness of breath: Secondary | ICD-10-CM | POA: Insufficient documentation

## 2016-07-07 DIAGNOSIS — Z79899 Other long term (current) drug therapy: Secondary | ICD-10-CM | POA: Diagnosis not present

## 2016-07-07 DIAGNOSIS — R0981 Nasal congestion: Secondary | ICD-10-CM | POA: Diagnosis not present

## 2016-07-07 LAB — BASIC METABOLIC PANEL
Anion gap: 8 (ref 5–15)
BUN: 15 mg/dL (ref 6–20)
CHLORIDE: 101 mmol/L (ref 101–111)
CO2: 32 mmol/L (ref 22–32)
Calcium: 9.2 mg/dL (ref 8.9–10.3)
Creatinine, Ser: 0.75 mg/dL (ref 0.44–1.00)
GFR calc Af Amer: >60 mL/min (ref >60)
Glucose, Bld: 143 mg/dL — ABNORMAL HIGH (ref 65–99)
POTASSIUM: 3.5 mmol/L (ref 3.5–5.1)
Sodium: 141 mmol/L (ref 135–145)

## 2016-07-07 LAB — CBC
HCT: 42.4 % (ref 35.0–47.0)
Hemoglobin: 14.3 g/dL (ref 12.0–16.0)
MCH: 29.7 pg (ref 26.0–34.0)
MCHC: 33.7 g/dL (ref 32.0–36.0)
MCV: 88.4 fL (ref 80.0–100.0)
PLATELETS: 337 10*3/uL (ref 150–440)
RBC: 4.79 MIL/uL (ref 3.80–5.20)
RDW: 15.5 % — ABNORMAL HIGH (ref 11.5–14.5)
WBC: 11.5 10*3/uL — ABNORMAL HIGH (ref 3.6–11.0)

## 2016-07-07 LAB — BRAIN NATRIURETIC PEPTIDE: B Natriuretic Peptide: 10 pg/mL (ref 0.0–100.0)

## 2016-07-07 LAB — TROPONIN I: Troponin I: <0.03 ng/mL (ref <0.03)

## 2016-07-07 MED ORDER — OXYMETAZOLINE HCL 0.05 % NA SOLN
1.0000 | Freq: Once | NASAL | Status: AC
  Administered 2016-07-07: 1 via NASAL
  Filled 2016-07-07: qty 15

## 2016-07-07 MED ORDER — PREDNISONE 20 MG PO TABS
60.0000 mg | ORAL_TABLET | Freq: Once | ORAL | Status: AC
  Administered 2016-07-07: 60 mg via ORAL
  Filled 2016-07-07: qty 3

## 2016-07-07 MED ORDER — LORAZEPAM 2 MG/ML IJ SOLN
INTRAMUSCULAR | Status: AC
  Filled 2016-07-07: qty 1

## 2016-07-07 MED ORDER — IPRATROPIUM-ALBUTEROL 0.5-2.5 (3) MG/3ML IN SOLN: 3.0000 mL | Freq: Two times a day (BID) | RESPIRATORY_TRACT | 0 refills | Status: DC

## 2016-07-07 MED ORDER — IPRATROPIUM-ALBUTEROL 0.5-2.5 (3) MG/3ML IN SOLN
3.0000 mL | Freq: Once | RESPIRATORY_TRACT | Status: AC
  Administered 2016-07-07: 3 mL via RESPIRATORY_TRACT
  Filled 2016-07-07: qty 3

## 2016-07-07 MED ORDER — PREDNISONE 20 MG PO TABS: 40.0000 mg | ORAL_TABLET | Freq: Every day | ORAL | 0 refills | Status: DC

## 2016-07-07 NOTE — Telephone Encounter (Signed)
Pt states now she has burning in her chest along with the other symptoms below. Please advise.

## 2016-07-07 NOTE — Telephone Encounter (Signed)
Pt calling back regarding her message left yesterday, states she has burning in her chest now.

## 2016-07-07 NOTE — Telephone Encounter (Signed)
Pt informed to go to the ER or to the nearest UC to be seen. Pt verbalized understanding. Nothing further needed.

## 2016-07-07 NOTE — ED Provider Notes (Signed)
[facility] Emergency Department Provider Note  Time seen: 6:35 PM  I have reviewed the triage vital signs and the nursing notes.   HISTORY  Chief Complaint Cough and Shortness of Breath    HPI Kelly Hale is a 61 y.o. female with a past medical history of COPD, wears 2 L as needed, hyperlipidemia, asthma, presents the emergency department with nasal congestion and shortness of breath. According to the patient the past several days she has been expressing increased nasal congestion. Patient also states mild shortness of breath although states this is unchanged from her baseline. Denies any cough or fever. Patient has been using her Flonase as prescribed by her pulmonologist but has not noticed any relief. Denies any chest pain.  Past Medical History:  Diagnosis Date  . Aspirin allergy   . Asthma   . CHF (congestive heart failure) ([place])   . COPD (chronic obstructive pulmonary disease) (HCC)    oxygen at home as needed (& at night)  . Hyperlipidemia   . Morbid obesity ([place])   . Tobacco abuse     Patient Active Problem List   Diagnosis Date Noted  . Erroneous encounter - disregard 03/10/2016  . Cigarette smoker 12/25/2015  . COPD exacerbation ([place]) 03/12/2015  . Community acquired pneumonia 03/12/2015  . Elevated troponin 03/12/2015  . OSA on CPAP 05/06/2014  . Morbid obesity ([place]) 02/12/2014  . Otitis, externa, infective 02/10/2014  . Neuropathic pain, arm 06/19/2011  . Asthma 05/09/2010  . TOBACCO USER 05/06/2010  . COPD with emphysema ([place]) 05/06/2010    Past Surgical History:  Procedure Laterality Date  . CESAREAN SECTION    . KNEE ARTHROSCOPY     right  . TUBAL LIGATION      Prior to Admission medications   Medication Sig Start Date End Date Taking? Authorizing Provider  albuterol (PROVENTIL HFA;VENTOLIN HFA) 108 (90 Base) MCG/ACT inhaler Inhale 1-2 puffs into the lungs every 6 (six) hours as needed for wheezing or shortness of  breath. 09/01/15   Vishal Mungal, MD  albuterol (PROVENTIL) (2.5 MG/3ML) 0.083% nebulizer solution Take 3 mLs (2.5 mg total) by nebulization every 6 (six) hours as needed for wheezing or shortness of breath. 01/12/15   Vishal Mungal, MD  amLODipine (NORVASC) 5 MG tablet Take 1 tablet (5 mg total) by mouth daily. 08/17/15   Minna Merritts, MD  aspirin 81 MG tablet Take 1 tablet (81 mg total) by mouth daily. 03/14/15   Aldean Jewett, MD  azithromycin (ZITHROMAX) 250 MG tablet Take 2 tablets on day one followed by 1 tablet daily until finished 05/10/16   Laverle Hobby, MD  fluticasone furoate-vilanterol (BREO ELLIPTA) 200-25 MCG/INH AEPB Inhale 1 puff into the lungs daily. 03/08/16   Vishal Mungal, MD  furosemide (LASIX) 20 MG tablet Take 1 tablet (20 mg total) by mouth 2 (two) times daily. 08/17/15   Minna Merritts, MD  Multiple Vitamin (MULTIVITAMIN) capsule Take 1 capsule by mouth daily.    Historical Provider, MD  OXYGEN Inhale 2 L into the lungs daily. 2 liters daily    Historical Provider, MD  predniSONE (DELTASONE) 10 MG tablet 6 tabs X 1d, 5 tabs X 1d, 4 tabs X 1d, 3 tabs X 1d, 2 tabs X 1d, 1 tab X 1d then stop 05/10/16   Laverle Hobby, MD  rosuvastatin (CRESTOR) 10 MG tablet Take 1 tablet (10 mg total) by mouth daily. 08/17/15   Minna Merritts, MD  Vitamin D, Cholecalciferol, 1000  UNITS CAPS Take 1,000 Units by mouth daily.     Historical Provider, MD    Allergies  Allergen Reactions  . Aspirin Other (See Comments)    REACTION: intol-asthma Patient takes low dose of aspirin.    Family History  Problem Relation Age of Onset  . Other Father     MVA  . Hypertension Mother   . Hyperlipidemia Mother   . Asthma Mother   . Other Mother     heart valve replaced  . Diabetes Paternal Grandfather   . Hypertension Maternal Grandfather   . Hyperlipidemia Maternal Grandfather   . Stroke Maternal Grandfather   . Hypertension Maternal Grandmother   . Alzheimer's disease Maternal  Grandmother   . Other Maternal Uncle     laryngeal cancer    Social History Social History  Substance Use Topics  . Smoking status: Former Smoker    Packs/day: 0.00    Years: 30.00    Types: Cigarettes    Quit date: 07/16/2015  . Smokeless tobacco: Never Used     Comment: quit in Sept 2015, but started back smoking up to 2 cigs per day  . Alcohol use No    Review of Systems Constitutional: Negative for fever.Positive for nasal congestion. Cardiovascular: Negative for chest pain. Respiratory: Chronic shortness of breath, denies any acute worsening. Gastrointestinal: Negative for abdominal pain Neurological: Negative for headache 10-point ROS otherwise negative.  ____________________________________________   PHYSICAL EXAM:  VITAL SIGNS: ED Triage Vitals  Enc Vitals Group     BP 07/07/16 1716 133/81     Pulse Rate 07/07/16 1716 (!) 108     Resp 07/07/16 1716 20     Temp 07/07/16 1716 98.3 F (36.8 C)     Temp Source 07/07/16 1716 Oral     SpO2 07/07/16 1716 94 %     Weight 07/07/16 1717 (!) 307 lb (139.3 kg)     Height 07/07/16 1717 5\' 6"  (1.676 m)     Head Circumference --      Peak Flow --      Pain Score 07/07/16 1830 0     Pain Loc --      Pain Edu? --      Excl. in [place]? --     Constitutional: Alert and oriented. Well appearing and in no distress. Eyes: Normal exam ENT   Head: Normocephalic and atraumatic.   Nose: Moderate congestion. No sinus tenderness to percussion.   Mouth/Throat: Mucous membranes are moist. Cardiovascular: Normal rate, regular rhythm.  Respiratory: Normal respiratory effort without tachypnea nor retractions. Mild to moderate expiratory wheeze bilaterally. No rales or rhonchi. Gastrointestinal: Soft and nontender. No distention.   Musculoskeletal: Nontender with normal range of motion in all extremities Neurologic:  Normal speech and language. No gross focal neurologic deficits Skin:  Skin is warm, dry and intact.   Psychiatric: Mood and affect are normal.   ____________________________________________    EKG  EKG reviewed and interpreted by myself shows sinus tachycardia 102 bpm, narrow QRS, normal axis, normal intervals, nonspecific without concerning ST changes.  ____________________________________________    RADIOLOGY  Chest x-ray consistent with COPD, no consolidations noted.  ____________________________________________   INITIAL IMPRESSION / ASSESSMENT AND PLAN / ED COURSE  Pertinent labs & imaging results that were available during my care of the patient were reviewed by me and considered in my medical decision making (see chart for details).  Patient presents the emergency department for nasal congestion and shortness of breath. Patient satting 91-92 percent  on room air, sats 96% on 2 L. Patient wears 2 L when needed at home. Patient doesn't mild to moderate expiratory wheezes bilaterally, with moderate nasal congestion. We will treat with DuoNeb's, prednisone, Afrin and closely monitor in the emergency department. Patient's labs are largely within normal limits including a negative troponin and normal BNP. Chest x-ray shows no acute abnormalities and EKG is reassuring.  Patient is feeling better after treatment. We will discharge home with a short course of prednisone, DuoNeb nebs and PCP follow-up. Patient agreeable to plan.  ____________________________________________   FINAL CLINICAL IMPRESSION(S) / ED DIAGNOSES  Dyspnea Nasal congestion    Harvest Dark, MD 07/07/16 2033

## 2016-07-07 NOTE — Telephone Encounter (Signed)
Please advise on message below Thanks

## 2016-07-07 NOTE — Telephone Encounter (Signed)
Please advise to go to ER

## 2016-07-07 NOTE — ED Triage Notes (Signed)
Pt in via POV with complaints of head congestion, cough since yesterday, pt reports waking up this morning with same but with burning sensation through chest and some shortness of breath.  Pt on 2L nasal cannula chronically.  NAD noted at this time.

## 2016-07-07 NOTE — Telephone Encounter (Signed)
Patient wants to know what Dr. Alva Garnet response is for the below issue.

## 2016-07-07 NOTE — ED Notes (Signed)

## 2016-07-29 ENCOUNTER — Telehealth: Payer: Self-pay | Admitting: Pulmonary Disease

## 2016-07-29 NOTE — Telephone Encounter (Signed)
Patient has upcoming appt on 4/26 and wants to know if we can do an o2 saturation test for insurance request while she is here. Please call to discuss.

## 2016-07-29 NOTE — Telephone Encounter (Signed)
Pt informed of what needs to be done for [facility] at her appt. Nothing further needed.

## 2016-08-05 ENCOUNTER — Ambulatory Visit (INDEPENDENT_AMBULATORY_CARE_PROVIDER_SITE_OTHER): Payer: Medicare HMO | Admitting: Pulmonary Disease

## 2016-08-05 ENCOUNTER — Encounter: Payer: Self-pay | Admitting: Pulmonary Disease

## 2016-08-05 VITALS — BP 120/84 | HR 126 | Resp 16 | Ht 66.0 in | Wt 309.0 lb

## 2016-08-05 DIAGNOSIS — J9611 Chronic respiratory failure with hypoxia: Secondary | ICD-10-CM | POA: Diagnosis not present

## 2016-08-05 DIAGNOSIS — G4733 Obstructive sleep apnea (adult) (pediatric): Secondary | ICD-10-CM

## 2016-08-05 DIAGNOSIS — R0609 Other forms of dyspnea: Secondary | ICD-10-CM | POA: Diagnosis not present

## 2016-08-05 DIAGNOSIS — R0902 Hypoxemia: Secondary | ICD-10-CM

## 2016-08-05 DIAGNOSIS — J45998 Other asthma: Secondary | ICD-10-CM | POA: Diagnosis not present

## 2016-08-05 DIAGNOSIS — J449 Chronic obstructive pulmonary disease, unspecified: Secondary | ICD-10-CM

## 2016-08-05 DIAGNOSIS — G4734 Idiopathic sleep related nonobstructive alveolar hypoventilation: Secondary | ICD-10-CM

## 2016-08-05 DIAGNOSIS — I5040 Unspecified combined systolic (congestive) and diastolic (congestive) heart failure: Secondary | ICD-10-CM | POA: Diagnosis not present

## 2016-08-05 DIAGNOSIS — R0602 Shortness of breath: Secondary | ICD-10-CM | POA: Diagnosis not present

## 2016-08-05 NOTE — Patient Instructions (Signed)
Continue Breo and albuterol as needed Weight loss as we discussed  Follow up in 6 months - target weight 290#

## 2016-08-05 NOTE — Progress Notes (Addendum)
PROBLEMS: COPD Former smoker  OSA  DATA: PFTs 10/01/13: FVC:  2.19 L ( 60 %pred), FEV1:  1.50 L ( 53 %pred), FEV1/FVC: 69%, TLC:  4.94 L ( 92 %pred), DLCO  99 %pred PSG 04/11/14: AHI 64/hr. CPAP recommendation 16 cm H2O Compliance 03/26 - 08/02/16: 30/30 days, 30 days > 4 hrs.   INTERVAL HISTORY: No major events  SUBJ: This is a routine reevaluation. She has no new complaints. She is compliant with CPAP. Since last visit, she has quit smoking altogether. She remains severely limited with shortness of breath. There is little day-to-day variation. Her shortness of breath is grade 3 (see below).   [facility]) dyspnea grading scale  Grade 0: breathlessness only with strenuous exercise Grade 1: breathless when hurrying while ambulating or walking up incline Grade 2: have to ambulate slower than average for age due to breathlessness  Grade 3: Have to stop after < 100 meters due to breathlessness Grade 4: breathlessness with ADLs (dressing, etc)  GOLD 1: FEV1 > 80% pred GOLD 2: FEV1 50-80% pred GOLD 3: FEV1 30-50% pred GOLD 4: FEV1 < 30% pred   OBJ: Vitals:   08/05/16 0857  BP: 120/84  Pulse: (!) 126  Resp: 16  SpO2: 90%  Weight: (!) 309 lb (140.2 kg)  Height: 5\' 6"  (1.676 m)    Room air  Gen: Obese in NAD HEENT: All WNL Neck: NO LAN, no JVD noted Lungs: full BS, no wheezes Cardiovascular: Reg, no M noted Abdomen: Obese, soft, NT +BS Ext: no C/C/E Neuro: no focal deficits  DATA: CXR 07/07/16: No acute findings  Outpatient Encounter Prescriptions as of 08/05/2016  Medication Sig Note  . albuterol (PROVENTIL HFA;VENTOLIN HFA) 108 (90 Base) MCG/ACT inhaler Inhale 1-2 puffs into the lungs every 6 (six) hours as needed for wheezing or shortness of breath.   Marland Kitchen albuterol (PROVENTIL) (2.5 MG/3ML) 0.083% nebulizer solution Take 3 mLs (2.5 mg total) by nebulization every 6 (six) hours as needed for wheezing or shortness of breath.   Marland Kitchen  amLODipine (NORVASC) 5 MG tablet Take 1 tablet (5 mg total) by mouth daily.   Marland Kitchen aspirin 81 MG tablet Take 1 tablet (81 mg total) by mouth daily.   . fluticasone furoate-vilanterol (BREO ELLIPTA) 200-25 MCG/INH AEPB Inhale 1 puff into the lungs daily.   . furosemide (LASIX) 20 MG tablet Take 1 tablet (20 mg total) by mouth 2 (two) times daily. 07/07/2016: Patient takes once daily unless second dose is needed.  Marland Kitchen ipratropium-albuterol (DUONEB) 0.5-2.5 (3) MG/3ML SOLN Take 3 mLs by nebulization 2 (two) times daily.   . Multiple Vitamin (MULTIVITAMIN) capsule Take 1 capsule by mouth daily.   . OXYGEN Inhale 2 L into the lungs daily. 2 liters daily 07/07/2016: Patient uses nightly when sleeping.  . rosuvastatin (CRESTOR) 10 MG tablet Take 1 tablet (10 mg total) by mouth daily.   . Vitamin D, Cholecalciferol, 1000 UNITS CAPS Take 1,000 Units by mouth daily.    . [DISCONTINUED] predniSONE (DELTASONE) 20 MG tablet Take 2 tablets (40 mg total) by mouth daily. (Patient not taking: Reported on 08/05/2016)    No facility-administered encounter medications on file as of 08/05/2016.    IMPRESSION: Chronic obstructive pulmonary disease, unspecified COPD type ([place])  Chronic hypoxemic respiratory failure (HCC)  Nocturnal hypoxemia  Exercise hypoxemia  DOE (dyspnea on exertion)  Severe obesity  OSA (obstructive sleep apnea)  Former smoker Moderate chronic obstructive pulmonary disease, well controlled Chronic hypoxemic respiratory failure Exercise  and nocturnal hypoxemia Severe OSA - well controlled on CPAP Severe exertional dyspnea - multifactorial. Probably the most reversible factor is obesity  PLAN: Cont COPD regimen as outlined above Continue CPAP - 16 cm H2O Continue oxygen with sleep and exercise - she is wearing compliantly and benefiting from it We discussed the potential benefits of weight loss, especially regarding her exercise capacity and discussed strategies for weight loss -  especially, limiting starches and sugars. I also advised making sure that she does not drink any of her calories  Follow-up in 6 months with a target weight of 290 pounds   Merton Border, MD PCCM service Mobile [phone] Pager [phone] 08/05/2016 2:14 PM

## 2016-08-08 ENCOUNTER — Telehealth: Payer: Self-pay | Admitting: Pulmonary Disease

## 2016-08-08 NOTE — Telephone Encounter (Signed)
Sent staff message to [facility] to see what is further needed due to walk and OV is charted. Will await response.

## 2016-08-08 NOTE — Telephone Encounter (Signed)
[facility] needs documentation for need of o2.  Please call.

## 2016-08-08 NOTE — Telephone Encounter (Signed)
Please see staff message I sent in regards to an addendum to this pt's chart. Thanks.

## 2016-08-11 ENCOUNTER — Ambulatory Visit: Payer: Medicare HMO | Admitting: Cardiovascular Disease

## 2016-08-18 NOTE — Telephone Encounter (Signed)
Kelly Hale calling from [facility] States this is her 4th time calling  She needs an update  Please call back

## 2016-08-18 NOTE — Telephone Encounter (Signed)
Please see message below about doing an addendum to pt's last OV note for her oxygen. The message below is from [place] with [facility]. Thanks.   From: Darlina Guys  Sent: 08/08/2016 12:12 PM  To: Renelda Mom, LPN   [facility].  I just spoke with Suanne Marker about this patient as well. Looks like the MD did not mention O2 use on the recent office visit note. Can he amend the note to talk about pt using O2 and to continue it?  Thank you!  Melissa

## 2016-08-25 ENCOUNTER — Telehealth: Payer: Self-pay | Admitting: Pulmonary Disease

## 2016-08-25 NOTE — Telephone Encounter (Signed)
Pt states [employer] is calling her asking for more information to cover oxygen. Please call.

## 2016-08-25 NOTE — Telephone Encounter (Signed)
Sent staff message to [employer] with [facility] to see what is further needed.

## 2016-08-25 NOTE — Telephone Encounter (Signed)
DS,  We need to have something in pt's last OV note stating she has O2 and is benefiting from it so that they can continue her O2 with [facility]. See message below.   --- Message ----- From: Jiles Crocker Sent: 08/25/2016  12:03 PM To: Renelda Mom, LPN  From what I see the notes from the 08/05/2016 visit do not mention the need for o2 and this wording is required to qualify the pt.  I see that there is a telephone note requested that the MD do an addendum to speak about this in that note.  HOpe this helps!

## 2016-08-29 NOTE — Telephone Encounter (Signed)
Done

## 2016-08-29 NOTE — Telephone Encounter (Signed)
OV faxed to [facility] and sent staff message to [place] with [facility] letting them know.

## 2016-08-30 NOTE — Telephone Encounter (Signed)
Spoke with pt and informed her OV notes were faxed to [facility] yesterday and that I made our rep with [facility] aware. She states Kelly Hale called her this am from [facility]. Apologized to pt for delay. Nothing further needed.

## 2016-09-04 DIAGNOSIS — I5040 Unspecified combined systolic (congestive) and diastolic (congestive) heart failure: Secondary | ICD-10-CM | POA: Diagnosis not present

## 2016-09-04 DIAGNOSIS — R0602 Shortness of breath: Secondary | ICD-10-CM | POA: Diagnosis not present

## 2016-09-04 DIAGNOSIS — J449 Chronic obstructive pulmonary disease, unspecified: Secondary | ICD-10-CM | POA: Diagnosis not present

## 2016-09-04 DIAGNOSIS — J45998 Other asthma: Secondary | ICD-10-CM | POA: Diagnosis not present

## 2016-10-05 DIAGNOSIS — J449 Chronic obstructive pulmonary disease, unspecified: Secondary | ICD-10-CM | POA: Diagnosis not present

## 2016-10-05 DIAGNOSIS — I5040 Unspecified combined systolic (congestive) and diastolic (congestive) heart failure: Secondary | ICD-10-CM | POA: Diagnosis not present

## 2016-10-05 DIAGNOSIS — J45998 Other asthma: Secondary | ICD-10-CM | POA: Diagnosis not present

## 2016-10-05 DIAGNOSIS — R0602 Shortness of breath: Secondary | ICD-10-CM | POA: Diagnosis not present

## 2016-10-18 ENCOUNTER — Other Ambulatory Visit: Payer: Self-pay | Admitting: Pulmonary Disease

## 2016-10-18 MED ORDER — FLUTICASONE FUROATE-VILANTEROL 200-25 MCG/INH IN AEPB: 1.0000 | INHALATION_SPRAY | Freq: Every day | RESPIRATORY_TRACT | 1 refills | Status: DC

## 2016-11-04 DIAGNOSIS — J45998 Other asthma: Secondary | ICD-10-CM | POA: Diagnosis not present

## 2016-11-04 DIAGNOSIS — R0602 Shortness of breath: Secondary | ICD-10-CM | POA: Diagnosis not present

## 2016-11-04 DIAGNOSIS — I5040 Unspecified combined systolic (congestive) and diastolic (congestive) heart failure: Secondary | ICD-10-CM | POA: Diagnosis not present

## 2016-11-04 DIAGNOSIS — J449 Chronic obstructive pulmonary disease, unspecified: Secondary | ICD-10-CM | POA: Diagnosis not present

## 2016-12-05 DIAGNOSIS — I5040 Unspecified combined systolic (congestive) and diastolic (congestive) heart failure: Secondary | ICD-10-CM | POA: Diagnosis not present

## 2016-12-05 DIAGNOSIS — R0602 Shortness of breath: Secondary | ICD-10-CM | POA: Diagnosis not present

## 2016-12-05 DIAGNOSIS — J45998 Other asthma: Secondary | ICD-10-CM | POA: Diagnosis not present

## 2016-12-05 DIAGNOSIS — J449 Chronic obstructive pulmonary disease, unspecified: Secondary | ICD-10-CM | POA: Diagnosis not present

## 2017-01-05 DIAGNOSIS — J449 Chronic obstructive pulmonary disease, unspecified: Secondary | ICD-10-CM | POA: Diagnosis not present

## 2017-01-05 DIAGNOSIS — J45998 Other asthma: Secondary | ICD-10-CM | POA: Diagnosis not present

## 2017-01-05 DIAGNOSIS — R0602 Shortness of breath: Secondary | ICD-10-CM | POA: Diagnosis not present

## 2017-01-05 DIAGNOSIS — I5040 Unspecified combined systolic (congestive) and diastolic (congestive) heart failure: Secondary | ICD-10-CM | POA: Diagnosis not present

## 2017-01-13 DIAGNOSIS — Z23 Encounter for immunization: Secondary | ICD-10-CM | POA: Diagnosis not present

## 2017-01-13 DIAGNOSIS — M545 Low back pain: Secondary | ICD-10-CM | POA: Diagnosis not present

## 2017-01-13 DIAGNOSIS — J449 Chronic obstructive pulmonary disease, unspecified: Secondary | ICD-10-CM | POA: Diagnosis not present

## 2017-01-13 DIAGNOSIS — I10 Essential (primary) hypertension: Secondary | ICD-10-CM | POA: Diagnosis not present

## 2017-01-13 DIAGNOSIS — Z0000 Encounter for general adult medical examination without abnormal findings: Secondary | ICD-10-CM | POA: Diagnosis not present

## 2017-01-13 DIAGNOSIS — G8929 Other chronic pain: Secondary | ICD-10-CM | POA: Diagnosis not present

## 2017-01-13 DIAGNOSIS — E785 Hyperlipidemia, unspecified: Secondary | ICD-10-CM | POA: Diagnosis not present

## 2017-01-13 DIAGNOSIS — Z1231 Encounter for screening mammogram for malignant neoplasm of breast: Secondary | ICD-10-CM | POA: Diagnosis not present

## 2017-01-13 DIAGNOSIS — E559 Vitamin D deficiency, unspecified: Secondary | ICD-10-CM | POA: Diagnosis not present

## 2017-01-18 ENCOUNTER — Other Ambulatory Visit: Payer: Self-pay | Admitting: Family Medicine

## 2017-01-18 DIAGNOSIS — M545 Low back pain: Secondary | ICD-10-CM

## 2017-01-27 NOTE — Progress Notes (Signed)
Cardiology Office Note  Date:  01/30/2017   ID:  AFSHEEN ANTONY, DOB 06-Jun-1955, MRN [id number]  PCP:  Harlan Stains, MD   Chief Complaint  Patient presents with  . OTHER    12 month f/u no complaints today. Meds reviewed verbally with pt.    HPI:   Mrs. Godown is a 61 year old woman with  morbid obesity,  long smoking history, Quit 07/2015 severe COPD, hypertension,  obstructive sleep apnea on CPAP,  chronic diastolic CHF,  previous hospitalization 12/23/2013 at [facility] for acute respiratory failure with acute on chronic diastolic CHF.  She received antibiotics, prednisone taper, aggressive diuresis with improvement of her symptoms. She presents for routine follow-up of her chronic diastolic CHF  In follow-up today she is trying to lose weight Reports having 6-9 pound loss over the past year Watching her foods  Currently using oxygen as needed particularly for exertion stopped smoking over one year ago   not exercising, deconditioned  Denies having significant palpitations, no chest pain  She takes Lasix on a consistent basis daily Followed by pulmonary, on inhalers  EKG on today's visit shows normal sinus rhythm with rate 10 bpm, no significant ST or T-wave changes  Other past medical history Echocardiogram results reviewed from 12/23/2013 showing normal ejection fraction, diastolic dysfunction, normal right heart size and function, normal right ventricular systolic pressure  PMH:   has a past medical history of Aspirin allergy; Asthma; CHF (congestive heart failure) ([place]); COPD (chronic obstructive pulmonary disease) ([place]); Hyperlipidemia; Morbid obesity ([place]); and Tobacco abuse.  PSH:    Past Surgical History:  Procedure Laterality Date  . CESAREAN SECTION    . KNEE ARTHROSCOPY     right  . TUBAL LIGATION      Current Outpatient Prescriptions  Medication Sig Dispense Refill  . albuterol (PROVENTIL HFA;VENTOLIN HFA) 108 (90 Base) MCG/ACT inhaler Inhale 1-2  puffs into the lungs every 6 (six) hours as needed for wheezing or shortness of breath. 3 Inhaler 0  . albuterol (PROVENTIL) (2.5 MG/3ML) 0.083% nebulizer solution Take 3 mLs (2.5 mg total) by nebulization every 6 (six) hours as needed for wheezing or shortness of breath. 100 vial 6  . amLODipine (NORVASC) 5 MG tablet Take 1 tablet (5 mg total) by mouth daily. 90 tablet 3  . aspirin 81 MG tablet Take 1 tablet (81 mg total) by mouth daily. 30 tablet 0  . fluticasone furoate-vilanterol (BREO ELLIPTA) 200-25 MCG/INH AEPB Inhale 1 puff into the lungs daily. 180 each 1  . furosemide (LASIX) 20 MG tablet Take 1 tablet (20 mg total) by mouth 2 (two) times daily. 180 tablet 3  . ipratropium-albuterol (DUONEB) 0.5-2.5 (3) MG/3ML SOLN Take 3 mLs by nebulization 2 (two) times daily. 20 mL 0  . Multiple Vitamin (MULTIVITAMIN) capsule Take 1 capsule by mouth daily.    . OXYGEN Inhale 2 L into the lungs daily. 2 liters daily    . rosuvastatin (CRESTOR) 10 MG tablet Take 1 tablet (10 mg total) by mouth daily. 90 tablet 3  . Vitamin D, Cholecalciferol, 1000 UNITS CAPS Take 1,000 Units by mouth daily.      No current facility-administered medications for this visit.      Allergies:   Aspirin   Social History:  The patient  reports that she quit smoking about 18 months ago. Her smoking use included Cigarettes. She smoked 0.00 packs per day for 30.00 years. She has never used smokeless tobacco. She reports that she does not drink alcohol or  use drugs.   Family History:   family history includes Alzheimer's disease in her maternal grandmother; Asthma in her mother; Diabetes in her paternal grandfather; Hyperlipidemia in her maternal grandfather and mother; Hypertension in her maternal grandfather, maternal grandmother, and mother; Other in her father, maternal uncle, and mother; Stroke in her maternal grandfather.    Review of Systems: Review of Systems  Constitutional: Negative.   Respiratory: Positive for  shortness of breath.   Cardiovascular: Negative.   Gastrointestinal: Negative.   Musculoskeletal: Negative.   Neurological: Negative.   Psychiatric/Behavioral: Negative.   All other systems reviewed and are negative.    PHYSICAL EXAM: VS:  BP 110/60 (BP Location: Right Wrist, Patient Position: Sitting, Cuff Size: Large)   Pulse (!) 101   Ht 5' 6.5" (1.689 m)   Wt (!) 303 lb 12 oz (137.8 kg)   BMI 48.29 kg/m  , BMI Body mass index is 48.29 kg/m. GEN: Well nourished, well developed, in no acute distress , obese, brings oxygen tank with her HEENT: normal  Neck: no JVD, carotid bruits, or masses Cardiac: RRR; no murmurs, rubs, or gallops,no edema  Respiratory:  clear to auscultation bilaterally, normal work of breathing GI: soft, nontender, nondistended, + BS MS: no deformity or atrophy  Skin: warm and dry, no rash Neuro:  Strength and sensation are intact Psych: euthymic mood, full affect    Recent Labs: 07/07/2016: B Natriuretic Peptide 10.0; BUN 15; Creatinine, Ser 0.75; Hemoglobin 14.3; Platelets 337; Potassium 3.5; Sodium 141    Lipid Panel Lab Results  Component Value Date   CHOL 128 12/24/2013   HDL 50 12/24/2013   LDLCALC 65 12/24/2013   TRIG 65 12/24/2013      Wt Readings from Last 3 Encounters:  01/30/17 (!) 303 lb 12 oz (137.8 kg)  08/05/16 (!) 309 lb (140.2 kg)  07/07/16 (!) 307 lb (139.3 kg)       ASSESSMENT AND PLAN:  SOB (shortness of breath) - Plan: EKG 12-Lead Shortness of breath likely multifactorial Biggest contributor is her weight, COPD Appears euvolemic, takes Lasix daily  Centrilobular emphysema (HCC) Followed by pulmonary, on inhalers. Stable symptoms Followed by pulmonary  TOBACCO USER Stop smoking April 2017  Morbid obesity ([place]) Dietary guide provided, recommended she start a walking plan if her back tolerates  OSA on CPAP - Plan: EKG 12-Lead Reports she is compliant with her CPAP at night, sleeps well  Chronic diastolic  CHF (congestive heart failure) ([place]) - Plan: EKG 12-Lead Euvolemic, will continue on Lasix daily   Total encounter time more than 25 minutes  Greater than 50% was spent in counseling and coordination of care with the patient   Disposition:   F/U  12 months as needed   Orders Placed This Encounter  Procedures  . EKG 12-Lead     Signed, Esmond Plants, M.D., Ph.D. 01/30/2017  [place], [place]

## 2017-01-30 ENCOUNTER — Encounter: Payer: Self-pay | Admitting: Cardiovascular Disease

## 2017-01-30 ENCOUNTER — Ambulatory Visit (INDEPENDENT_AMBULATORY_CARE_PROVIDER_SITE_OTHER): Payer: Medicare HMO | Admitting: Cardiovascular Disease

## 2017-01-30 DIAGNOSIS — F17210 Nicotine dependence, cigarettes, uncomplicated: Secondary | ICD-10-CM | POA: Diagnosis not present

## 2017-01-30 DIAGNOSIS — G4733 Obstructive sleep apnea (adult) (pediatric): Secondary | ICD-10-CM

## 2017-01-30 DIAGNOSIS — Z9989 Dependence on other enabling machines and devices: Secondary | ICD-10-CM

## 2017-01-30 DIAGNOSIS — J432 Centrilobular emphysema: Secondary | ICD-10-CM | POA: Diagnosis not present

## 2017-01-30 DIAGNOSIS — F17200 Nicotine dependence, unspecified, uncomplicated: Secondary | ICD-10-CM

## 2017-01-30 MED ORDER — FUROSEMIDE 20 MG PO TABS: 20.0000 mg | ORAL_TABLET | Freq: Two times a day (BID) | ORAL | 3 refills | Status: DC | PRN

## 2017-01-30 NOTE — Patient Instructions (Addendum)

## 2017-01-31 ENCOUNTER — Ambulatory Visit
Admission: RE | Admit: 2017-01-31 | Discharge: 2017-01-31 | Disposition: A | Discharge status: Home / Self Care | Payer: Medicare HMO | Source: Ambulatory Visit | Attending: Family Medicine | Admitting: Family Medicine

## 2017-01-31 DIAGNOSIS — M5126 Other intervertebral disc displacement, lumbar region: Secondary | ICD-10-CM | POA: Diagnosis not present

## 2017-01-31 DIAGNOSIS — M545 Low back pain: Secondary | ICD-10-CM

## 2017-02-04 DIAGNOSIS — J449 Chronic obstructive pulmonary disease, unspecified: Secondary | ICD-10-CM | POA: Diagnosis not present

## 2017-02-04 DIAGNOSIS — J45998 Other asthma: Secondary | ICD-10-CM | POA: Diagnosis not present

## 2017-02-04 DIAGNOSIS — I5040 Unspecified combined systolic (congestive) and diastolic (congestive) heart failure: Secondary | ICD-10-CM | POA: Diagnosis not present

## 2017-02-04 DIAGNOSIS — R0602 Shortness of breath: Secondary | ICD-10-CM | POA: Diagnosis not present

## 2017-02-13 DIAGNOSIS — M545 Low back pain: Secondary | ICD-10-CM | POA: Diagnosis not present

## 2017-02-14 DIAGNOSIS — H524 Presbyopia: Secondary | ICD-10-CM | POA: Diagnosis not present

## 2017-02-15 DIAGNOSIS — Z1231 Encounter for screening mammogram for malignant neoplasm of breast: Secondary | ICD-10-CM | POA: Diagnosis not present

## 2017-02-17 ENCOUNTER — Ambulatory Visit: Payer: Medicare HMO | Admitting: Pulmonary Disease

## 2017-02-20 ENCOUNTER — Ambulatory Visit: Payer: Medicare HMO | Admitting: Pulmonary Disease

## 2017-02-20 ENCOUNTER — Encounter: Payer: Self-pay | Admitting: Pulmonary Disease

## 2017-02-20 VITALS — BP 140/76 | HR 114 | Resp 16 | Ht 66.5 in | Wt 305.0 lb

## 2017-02-20 DIAGNOSIS — J449 Chronic obstructive pulmonary disease, unspecified: Secondary | ICD-10-CM

## 2017-02-20 DIAGNOSIS — Z87891 Personal history of nicotine dependence: Secondary | ICD-10-CM | POA: Diagnosis not present

## 2017-02-20 DIAGNOSIS — G4733 Obstructive sleep apnea (adult) (pediatric): Secondary | ICD-10-CM

## 2017-02-20 NOTE — Patient Instructions (Addendum)
Continue CPAP Continue Breo Continue albuterol as needed Continue efforts at weight loss Follow-up in 6 months

## 2017-02-20 NOTE — Progress Notes (Signed)
PROBLEMS: COPD Former smoker  OSA  DATA: PFTs 10/01/13: FVC:  2.19 L ( 60 %pred), FEV1:  1.50 L ( 53 %pred), FEV1/FVC: 69%, TLC:  4.94 L ( 92 %pred), DLCO  99 %pred PSG 04/11/14: AHI 64/hr. CPAP recommendation 16 cm H2O Compliance 03/26 - 08/02/16: 30/30 days, 30 days > 4 hrs.   INTERVAL HISTORY: No major events  SUBJ: This is a routine reevaluation.  Last seen 08/05/16.  She has no new complaints. She veins compliant with CPAP.  She remains abstinent from cigarettes. She remains severely limited with shortness of breath. There is little day-to-day variation.  Continues to have moderate DOE (grade 3).    OBJ: Vitals:   02/20/17 1143  BP: 140/76  Pulse: (!) 114  Resp: 16  SpO2: 91%  Weight: (!) 138.3 kg (305 lb)  Height: 5' 6.5" (1.689 m)  RA  Gen: Obese, NAD HEENT: WNL Neck: No LAN, no JVD noted Lungs: full BS, no wheezes Cardiovascular: Reg, no M noted Abdomen: Obese, soft, NT +BS Ext: no C/C/E Neuro: no focal deficits  DATA: CXR: NNF   IMPRESSION: Former smoker Moderate chronic obstructive pulmonary disease, well controlled Chronic hypoxemic respiratory failure Exercise and nocturnal hypoxemia Severe OSA - well controlled on CPAP Moderate exertional dyspnea - multifactorial. Probably the most reversible factor is obesity  PLAN: Cont [place], as needed albuterol Continue CPAP - 16 cm H2O Continue oxygen with sleep and exercise - she is wearing compliantly and benefiting from it Continue efforts at weight loss  Follow-up in 6 months with a target weight of 290 pounds   Merton Border, MD PCCM service Mobile [phone] Pager [phone] 02/26/2017 2:07 PM

## 2017-02-21 ENCOUNTER — Other Ambulatory Visit: Payer: Self-pay | Admitting: *Deleted

## 2017-02-21 MED ORDER — FUROSEMIDE 20 MG PO TABS: 20.0000 mg | ORAL_TABLET | ORAL | 3 refills | Status: AC

## 2017-02-24 DIAGNOSIS — M545 Low back pain: Secondary | ICD-10-CM | POA: Diagnosis not present

## 2017-02-24 DIAGNOSIS — Z1211 Encounter for screening for malignant neoplasm of colon: Secondary | ICD-10-CM | POA: Diagnosis not present

## 2017-03-07 DIAGNOSIS — I5040 Unspecified combined systolic (congestive) and diastolic (congestive) heart failure: Secondary | ICD-10-CM | POA: Diagnosis not present

## 2017-03-07 DIAGNOSIS — R0602 Shortness of breath: Secondary | ICD-10-CM | POA: Diagnosis not present

## 2017-03-07 DIAGNOSIS — J45998 Other asthma: Secondary | ICD-10-CM | POA: Diagnosis not present

## 2017-03-07 DIAGNOSIS — J449 Chronic obstructive pulmonary disease, unspecified: Secondary | ICD-10-CM | POA: Diagnosis not present

## 2017-03-15 DIAGNOSIS — J432 Centrilobular emphysema: Secondary | ICD-10-CM | POA: Diagnosis not present

## 2017-03-15 DIAGNOSIS — J45998 Other asthma: Secondary | ICD-10-CM | POA: Diagnosis not present

## 2017-03-15 DIAGNOSIS — I5040 Unspecified combined systolic (congestive) and diastolic (congestive) heart failure: Secondary | ICD-10-CM | POA: Diagnosis not present

## 2017-03-15 DIAGNOSIS — G4733 Obstructive sleep apnea (adult) (pediatric): Secondary | ICD-10-CM | POA: Diagnosis not present

## 2017-03-15 DIAGNOSIS — J449 Chronic obstructive pulmonary disease, unspecified: Secondary | ICD-10-CM | POA: Diagnosis not present

## 2017-03-15 DIAGNOSIS — R0602 Shortness of breath: Secondary | ICD-10-CM | POA: Diagnosis not present

## 2017-04-06 DIAGNOSIS — J449 Chronic obstructive pulmonary disease, unspecified: Secondary | ICD-10-CM | POA: Diagnosis not present

## 2017-04-06 DIAGNOSIS — R0602 Shortness of breath: Secondary | ICD-10-CM | POA: Diagnosis not present

## 2017-04-06 DIAGNOSIS — I5040 Unspecified combined systolic (congestive) and diastolic (congestive) heart failure: Secondary | ICD-10-CM | POA: Diagnosis not present

## 2017-04-06 DIAGNOSIS — J45998 Other asthma: Secondary | ICD-10-CM | POA: Diagnosis not present

## 2017-05-07 DIAGNOSIS — J45998 Other asthma: Secondary | ICD-10-CM | POA: Diagnosis not present

## 2017-05-07 DIAGNOSIS — J449 Chronic obstructive pulmonary disease, unspecified: Secondary | ICD-10-CM | POA: Diagnosis not present

## 2017-05-07 DIAGNOSIS — I5040 Unspecified combined systolic (congestive) and diastolic (congestive) heart failure: Secondary | ICD-10-CM | POA: Diagnosis not present

## 2017-05-07 DIAGNOSIS — R0602 Shortness of breath: Secondary | ICD-10-CM | POA: Diagnosis not present

## 2017-05-22 ENCOUNTER — Other Ambulatory Visit: Payer: Self-pay | Admitting: Pulmonary Disease

## 2017-06-07 DIAGNOSIS — I5040 Unspecified combined systolic (congestive) and diastolic (congestive) heart failure: Secondary | ICD-10-CM | POA: Diagnosis not present

## 2017-06-07 DIAGNOSIS — R0602 Shortness of breath: Secondary | ICD-10-CM | POA: Diagnosis not present

## 2017-06-07 DIAGNOSIS — J45998 Other asthma: Secondary | ICD-10-CM | POA: Diagnosis not present

## 2017-06-07 DIAGNOSIS — J449 Chronic obstructive pulmonary disease, unspecified: Secondary | ICD-10-CM | POA: Diagnosis not present

## 2017-07-05 DIAGNOSIS — J449 Chronic obstructive pulmonary disease, unspecified: Secondary | ICD-10-CM | POA: Diagnosis not present

## 2017-07-05 DIAGNOSIS — J45998 Other asthma: Secondary | ICD-10-CM | POA: Diagnosis not present

## 2017-07-05 DIAGNOSIS — R0602 Shortness of breath: Secondary | ICD-10-CM | POA: Diagnosis not present

## 2017-07-05 DIAGNOSIS — I5040 Unspecified combined systolic (congestive) and diastolic (congestive) heart failure: Secondary | ICD-10-CM | POA: Diagnosis not present

## 2017-08-05 DIAGNOSIS — J45998 Other asthma: Secondary | ICD-10-CM | POA: Diagnosis not present

## 2017-08-05 DIAGNOSIS — R0602 Shortness of breath: Secondary | ICD-10-CM | POA: Diagnosis not present

## 2017-08-05 DIAGNOSIS — I5040 Unspecified combined systolic (congestive) and diastolic (congestive) heart failure: Secondary | ICD-10-CM | POA: Diagnosis not present

## 2017-08-05 DIAGNOSIS — J449 Chronic obstructive pulmonary disease, unspecified: Secondary | ICD-10-CM | POA: Diagnosis not present

## 2017-08-22 DIAGNOSIS — M5136 Other intervertebral disc degeneration, lumbar region: Secondary | ICD-10-CM | POA: Diagnosis not present

## 2017-08-22 DIAGNOSIS — E785 Hyperlipidemia, unspecified: Secondary | ICD-10-CM | POA: Diagnosis not present

## 2017-08-22 DIAGNOSIS — I10 Essential (primary) hypertension: Secondary | ICD-10-CM | POA: Diagnosis not present

## 2017-08-22 DIAGNOSIS — M25561 Pain in right knee: Secondary | ICD-10-CM | POA: Diagnosis not present

## 2017-08-22 DIAGNOSIS — J449 Chronic obstructive pulmonary disease, unspecified: Secondary | ICD-10-CM | POA: Diagnosis not present

## 2017-08-22 DIAGNOSIS — I5032 Chronic diastolic (congestive) heart failure: Secondary | ICD-10-CM | POA: Diagnosis not present

## 2017-08-25 DIAGNOSIS — M545 Low back pain: Secondary | ICD-10-CM | POA: Diagnosis not present

## 2017-08-25 DIAGNOSIS — M1711 Unilateral primary osteoarthritis, right knee: Secondary | ICD-10-CM | POA: Diagnosis not present

## 2017-08-28 ENCOUNTER — Encounter: Payer: Self-pay | Admitting: Pulmonary Disease

## 2017-08-28 ENCOUNTER — Ambulatory Visit: Payer: Medicare HMO | Admitting: Pulmonary Disease

## 2017-08-28 VITALS — BP 116/76 | HR 110 | Ht 66.5 in | Wt 308.0 lb

## 2017-08-28 DIAGNOSIS — J449 Chronic obstructive pulmonary disease, unspecified: Secondary | ICD-10-CM | POA: Diagnosis not present

## 2017-08-28 DIAGNOSIS — R0609 Other forms of dyspnea: Secondary | ICD-10-CM

## 2017-08-28 DIAGNOSIS — J9611 Chronic respiratory failure with hypoxia: Secondary | ICD-10-CM | POA: Diagnosis not present

## 2017-08-28 DIAGNOSIS — Z87891 Personal history of nicotine dependence: Secondary | ICD-10-CM

## 2017-08-28 DIAGNOSIS — G4733 Obstructive sleep apnea (adult) (pediatric): Secondary | ICD-10-CM

## 2017-08-28 NOTE — Patient Instructions (Signed)
For sleep apnea, continue CPAP with oxygen For COPD, continue Breo inhaler and albuterol inhaler as needed Continue oxygen therapy with exertion We discussed weight loss strategies Follow-up in 6 months or sooner as needed

## 2017-08-30 NOTE — Progress Notes (Signed)
PROBLEMS: COPD Former smoker  OSA  DATA: PFTs 10/01/13: FVC:  2.19 L ( 60 %pred), FEV1:  1.50 L ( 53 %pred), FEV1/FVC: 69%, TLC:  4.94 L ( 92 %pred), DLCO  99 %pred PSG 04/11/14: AHI 64/hr. CPAP recommendation 16 cm H2O Compliance 03/26 - 08/02/16: 30/30 days, 30 days > 4 hrs.  Compliance 04/19 - 08/26/17: 30/30 days, 30 days > 4 hrs.   INTERVAL HISTORY: Last visit 02/20/2017.  No major events  SUBJ: This is a routine reevaluation.  She is wearing CPAP compliantly.  She believes that it is beneficial to her.  She denies significant daytime hypersomnolence.  She continues to have moderate exertional dyspnea with little day-to-day variation.  She remains on a Breo inhaler.  She rarely uses her albuterol rescue inhaler.  She has not made a concerted effort at weight loss but recognizes the importance of this with regard to her overall health and her respiratory status.  She denies fever, chest pain, purulent sputum, hemoptysis, lower extremity edema, calf tenderness.   OBJ: Vitals:   08/28/17 1359 08/28/17 1404  BP:  116/76  Pulse:  (!) 110  SpO2:  91%  Weight: (!) 308 lb (139.7 kg)   Height: 5' 6.5" (1.689 m)   RA  Gen: Obese, NAD HEENT: NCAT, sclera white Neck: No JVD Lungs: breath sounds full, no wheezes or other adventitious sounds Cardiovascular: RRR, no murmurs Abdomen: Soft, nontender, normal BS Ext: without clubbing, cyanosis, edema Neuro: grossly intact Skin: Limited exam, no lesions noted   DATA: CXR: NNF   IMPRESSION:   ICD-10-CM   1. OSA (obstructive sleep apnea) G47.33   2. Chronic hypoxemic respiratory failure (HCC) J96.11   3. COPD, moderate ([place]) J44.9   4. Severe obesity (HCC) E66.01   5. DOE (dyspnea on exertion) R06.09   6. Former smoker Z87.891     I continue to believe that the most reversible contributor to her exertional dyspnea is obesity.  We again discussed weight loss strategies in detail (in particular dietary strategies focusing on a diet  with low glycemic index)  PLAN: For sleep apnea, continue CPAP with oxygen For COPD, continue Breo inhaler and albuterol inhaler as needed Continue oxygen therapy with exertion We discussed weight loss strategies Follow-up in 6 months or sooner as needed   Merton Border, MD PCCM service Mobile [phone] Pager [phone] 08/30/2017 4:43 PM

## 2017-09-04 DIAGNOSIS — J449 Chronic obstructive pulmonary disease, unspecified: Secondary | ICD-10-CM | POA: Diagnosis not present

## 2017-09-04 DIAGNOSIS — I5040 Unspecified combined systolic (congestive) and diastolic (congestive) heart failure: Secondary | ICD-10-CM | POA: Diagnosis not present

## 2017-09-04 DIAGNOSIS — R0602 Shortness of breath: Secondary | ICD-10-CM | POA: Diagnosis not present

## 2017-09-04 DIAGNOSIS — J45998 Other asthma: Secondary | ICD-10-CM | POA: Diagnosis not present

## 2017-09-11 DIAGNOSIS — M47816 Spondylosis without myelopathy or radiculopathy, lumbar region: Secondary | ICD-10-CM | POA: Diagnosis not present

## 2017-09-11 DIAGNOSIS — M545 Low back pain: Secondary | ICD-10-CM | POA: Diagnosis not present

## 2017-09-20 ENCOUNTER — Ambulatory Visit: Payer: Medicare HMO

## 2017-09-27 ENCOUNTER — Ambulatory Visit (INDEPENDENT_AMBULATORY_CARE_PROVIDER_SITE_OTHER): Payer: Medicare HMO | Admitting: *Deleted

## 2017-09-27 DIAGNOSIS — J432 Centrilobular emphysema: Secondary | ICD-10-CM

## 2017-09-27 NOTE — Progress Notes (Signed)
SMW performed today with POC walk. Pt didn't qualify for POC.   SIX MIN WALK 09/27/2017 08/05/2016 02/10/2014  Medications Albuterol neb, Norvasc, Aspirin, Lasix, - -  Supplimental Oxygen during Test? (L/min) No No No  Laps 2 - -  Partial Lap (in Meters) 12 - -  Baseline BP (sitting) 132/84 - -  Baseline Heartrate 114 - -  Baseline Dyspnea (Borg Scale) 2 - -  Baseline Fatigue (Borg Scale) 0.5 - -  Baseline SPO2 92 - -  BP (sitting) 148/90 - -  Heartrate 145 - -  Dyspnea (Borg Scale) 5 - -  Fatigue (Borg Scale) 0.5 - -  SPO2 87 - -  BP (sitting) 134/82 - -  Heartrate 114 - -  SPO2 94 - -  Stopped or Paused before Six Minutes Yes - -  Other Symptoms at end of Exercise SOB - -  Distance Completed 108 - -  Tech Comments: - patient was able to complete 1 lap desatured 86%. stopped and placed on O2--2L/M 88% increased O2 to 3L/M 95%.

## 2017-10-05 DIAGNOSIS — R0602 Shortness of breath: Secondary | ICD-10-CM | POA: Diagnosis not present

## 2017-10-05 DIAGNOSIS — J45998 Other asthma: Secondary | ICD-10-CM | POA: Diagnosis not present

## 2017-10-05 DIAGNOSIS — I5040 Unspecified combined systolic (congestive) and diastolic (congestive) heart failure: Secondary | ICD-10-CM | POA: Diagnosis not present

## 2017-10-05 DIAGNOSIS — J449 Chronic obstructive pulmonary disease, unspecified: Secondary | ICD-10-CM | POA: Diagnosis not present

## 2017-11-04 DIAGNOSIS — I5040 Unspecified combined systolic (congestive) and diastolic (congestive) heart failure: Secondary | ICD-10-CM | POA: Diagnosis not present

## 2017-11-04 DIAGNOSIS — J449 Chronic obstructive pulmonary disease, unspecified: Secondary | ICD-10-CM | POA: Diagnosis not present

## 2017-11-04 DIAGNOSIS — R0602 Shortness of breath: Secondary | ICD-10-CM | POA: Diagnosis not present

## 2017-11-04 DIAGNOSIS — J45998 Other asthma: Secondary | ICD-10-CM | POA: Diagnosis not present

## 2017-12-01 ENCOUNTER — Other Ambulatory Visit: Payer: Self-pay | Admitting: *Deleted

## 2017-12-01 ENCOUNTER — Telehealth: Payer: Self-pay | Admitting: Pulmonary Disease

## 2017-12-01 MED ORDER — FLUTICASONE FUROATE-VILANTEROL 200-25 MCG/INH IN AEPB: 1.0000 | INHALATION_SPRAY | Freq: Every day | RESPIRATORY_TRACT | 1 refills | Status: DC

## 2017-12-01 NOTE — Telephone Encounter (Signed)
Returned call to patient and made aware rx has already been submitted. Nothing further needed.

## 2017-12-01 NOTE — Telephone Encounter (Signed)
Patient calling just to make sure we received paperwork for Auth on her Brio from [facility]   Please advise and let patient know if we have or not

## 2017-12-05 ENCOUNTER — Other Ambulatory Visit: Payer: Self-pay | Admitting: *Deleted

## 2017-12-05 DIAGNOSIS — R0602 Shortness of breath: Secondary | ICD-10-CM | POA: Diagnosis not present

## 2017-12-05 DIAGNOSIS — J45998 Other asthma: Secondary | ICD-10-CM | POA: Diagnosis not present

## 2017-12-05 DIAGNOSIS — I5040 Unspecified combined systolic (congestive) and diastolic (congestive) heart failure: Secondary | ICD-10-CM | POA: Diagnosis not present

## 2017-12-05 DIAGNOSIS — J449 Chronic obstructive pulmonary disease, unspecified: Secondary | ICD-10-CM | POA: Diagnosis not present

## 2017-12-05 MED ORDER — FLUTICASONE FUROATE-VILANTEROL 200-25 MCG/INH IN AEPB: 1.0000 | INHALATION_SPRAY | Freq: Every day | RESPIRATORY_TRACT | 1 refills | Status: DC

## 2018-01-05 DIAGNOSIS — J45998 Other asthma: Secondary | ICD-10-CM | POA: Diagnosis not present

## 2018-01-05 DIAGNOSIS — R0602 Shortness of breath: Secondary | ICD-10-CM | POA: Diagnosis not present

## 2018-01-05 DIAGNOSIS — J449 Chronic obstructive pulmonary disease, unspecified: Secondary | ICD-10-CM | POA: Diagnosis not present

## 2018-01-05 DIAGNOSIS — I5040 Unspecified combined systolic (congestive) and diastolic (congestive) heart failure: Secondary | ICD-10-CM | POA: Diagnosis not present

## 2018-02-04 DIAGNOSIS — J45998 Other asthma: Secondary | ICD-10-CM | POA: Diagnosis not present

## 2018-02-04 DIAGNOSIS — R0602 Shortness of breath: Secondary | ICD-10-CM | POA: Diagnosis not present

## 2018-02-04 DIAGNOSIS — J449 Chronic obstructive pulmonary disease, unspecified: Secondary | ICD-10-CM | POA: Diagnosis not present

## 2018-02-04 DIAGNOSIS — I5040 Unspecified combined systolic (congestive) and diastolic (congestive) heart failure: Secondary | ICD-10-CM | POA: Diagnosis not present

## 2018-02-15 DIAGNOSIS — H5213 Myopia, bilateral: Secondary | ICD-10-CM | POA: Diagnosis not present

## 2018-02-21 DIAGNOSIS — J45998 Other asthma: Secondary | ICD-10-CM | POA: Diagnosis not present

## 2018-02-21 DIAGNOSIS — J449 Chronic obstructive pulmonary disease, unspecified: Secondary | ICD-10-CM | POA: Diagnosis not present

## 2018-02-21 DIAGNOSIS — G4733 Obstructive sleep apnea (adult) (pediatric): Secondary | ICD-10-CM | POA: Diagnosis not present

## 2018-02-21 DIAGNOSIS — I5040 Unspecified combined systolic (congestive) and diastolic (congestive) heart failure: Secondary | ICD-10-CM | POA: Diagnosis not present

## 2018-02-21 DIAGNOSIS — J432 Centrilobular emphysema: Secondary | ICD-10-CM | POA: Diagnosis not present

## 2018-02-21 DIAGNOSIS — R0602 Shortness of breath: Secondary | ICD-10-CM | POA: Diagnosis not present

## 2018-03-02 ENCOUNTER — Encounter: Payer: Self-pay | Admitting: Pulmonary Disease

## 2018-03-02 ENCOUNTER — Ambulatory Visit: Payer: Medicare HMO | Admitting: Pulmonary Disease

## 2018-03-02 VITALS — BP 118/84 | HR 110 | Resp 16 | Ht 66.5 in | Wt 306.0 lb

## 2018-03-02 DIAGNOSIS — Z23 Encounter for immunization: Secondary | ICD-10-CM | POA: Diagnosis not present

## 2018-03-02 DIAGNOSIS — I10 Essential (primary) hypertension: Secondary | ICD-10-CM | POA: Insufficient documentation

## 2018-03-02 DIAGNOSIS — K219 Gastro-esophageal reflux disease without esophagitis: Secondary | ICD-10-CM | POA: Insufficient documentation

## 2018-03-02 DIAGNOSIS — R030 Elevated blood-pressure reading, without diagnosis of hypertension: Secondary | ICD-10-CM | POA: Insufficient documentation

## 2018-03-02 DIAGNOSIS — J9611 Chronic respiratory failure with hypoxia: Secondary | ICD-10-CM | POA: Diagnosis not present

## 2018-03-02 DIAGNOSIS — G4733 Obstructive sleep apnea (adult) (pediatric): Secondary | ICD-10-CM | POA: Diagnosis not present

## 2018-03-02 DIAGNOSIS — Z87891 Personal history of nicotine dependence: Secondary | ICD-10-CM

## 2018-03-02 DIAGNOSIS — E559 Vitamin D deficiency, unspecified: Secondary | ICD-10-CM | POA: Insufficient documentation

## 2018-03-02 DIAGNOSIS — F17200 Nicotine dependence, unspecified, uncomplicated: Secondary | ICD-10-CM | POA: Insufficient documentation

## 2018-03-02 NOTE — Patient Instructions (Signed)
Continue CPAP with oxygen bleed in during sleep Continue Breo inhaler, 1 inhalation daily.  Rinse mouth after use I encouraged more liberal use of albuterol (ProAir air or nebulizer) for increased shortness of breath, chest tightness, wheezing, cough  Follow-up in 6 months.  Call sooner if needed

## 2018-03-04 ENCOUNTER — Encounter: Payer: Self-pay | Admitting: Pulmonary Disease

## 2018-03-04 NOTE — Progress Notes (Signed)
PROBLEMS: COPD Former smoker  OSA  DATA: PFTs 10/01/13: FVC:  2.19 L ( 60 %pred), FEV1:  1.50 L ( 53 %pred), FEV1/FVC: 69%, TLC:  4.94 L ( 92 %pred), DLCO  99 %pred PSG 04/11/14: AHI 64/hr. CPAP recommendation 16 cm H2O Compliance 03/26 - 08/02/16: 30/30 days, 30 days > 4 hrs.  Compliance 04/19 - 08/26/17: 30/30 days, 30 days > 4 hrs.  6MWT 09/27/17: 108 meters. Desaturated to 86% on RA, 88% on 2 LPM CPAP compliance 10/21-11/19/19: 30/30 nights. CPAP 16 cm H2O, Mean AHI 0.3  INTERVAL HISTORY: Last visit 09/27/2017.  No major events  SUBJ: This is a scheduled follow up. She has no new complaints. She is compliant with O2. She is compliant with CPAP. She remains on [facility]. She rarely uses albuterol MDI. She remains markedly limited by DOE. She denies fever, chest pain, purulent sputum, hemoptysis, lower extremity edema, calf tenderness.  OBJ: Vitals:   03/02/18 1144 03/02/18 1149  BP:  118/84  Pulse:  (!) 110  Resp: 16   SpO2:  90%  Weight: (!) 306 lb (138.8 kg)   Height: 5' 6.5" (1.689 m)   RA  Gen: Obese, NAD HEENT: NCAT, sclera white Neck: No JVD Lungs: breath sounds mildly diminished, scattered wheezes Cardiovascular: RRR, no murmurs Abdomen: Soft, nontender, normal BS Ext: without clubbing, cyanosis, edema Neuro: grossly intact Skin: Limited exam, no lesions noted    DATA: BMP Latest Ref Rng & Units 07/07/2016 03/14/2015 03/12/2015  Glucose 65 - 99 mg/dL 143(H) 113(H) 123(H)  BUN 6 - 20 mg/dL 15 15 19   Creatinine 0.44 - 1.00 mg/dL 0.75 0.57 0.67  Sodium 135 - 145 mmol/L 141 139 137  Potassium 3.5 - 5.1 mmol/L 3.5 3.9 3.6  Chloride 101 - 111 mmol/L 101 102 96(L)  CO2 22 - 32 mmol/L 32 32 30  Calcium 8.9 - 10.3 mg/dL 9.2 9.0 8.9   CBC Latest Ref Rng & Units 07/07/2016 03/12/2015 12/24/2013  WBC 3.6 - 11.0 K/uL 11.5(H) 31.3(H) 7.6  Hemoglobin 12.0 - 16.0 g/dL 14.3 14.6 16.2(H)  Hematocrit 35.0 - 47.0 % 42.4 44.8 53.4(H)  Platelets 150 - 440 K/uL 337 274 266    CXR:  NNF   IMPRESSION:   ICD-10-CM   1. OSA (obstructive sleep apnea) G47.33   2. Need for immunization against influenza Z23 Flu Vaccine QUAD 36+ mos IM  3. Chronic hypoxemic respiratory failure (HCC) J96.11   4. Severe obesity E66.01   5. Former smoker Z87.891       PLAN: Continue CPAP with oxygen bleed in during sleep Continue Breo inhaler, 1 inhalation daily.  Rinse mouth after use  I encouraged more liberal use of albuterol (ProAir air or nebulizer) for increased shortness of breath, chest tightness, wheezing, cough  We will seek a POC option for her per her request  Follow-up in 6 months.  Call sooner if needed  Merton Border, MD PCCM service Mobile [phone] Pager [phone] 03/04/2018 6:23 PM

## 2018-03-05 ENCOUNTER — Telehealth: Payer: Self-pay | Admitting: Pulmonary Disease

## 2018-03-05 NOTE — Telephone Encounter (Signed)
Patient calling stating she was seen on Friday by Dr Alva Garnet and was advised to use her nebulizer and her Pro-air more since that  But she states this isn't helping much She states she was advised to call if it didn't we may have to call in Prednisone  Please advise

## 2018-03-05 NOTE — Telephone Encounter (Signed)
Patient returning our call to see if we may call today or tomorrow

## 2018-03-06 MED ORDER — PREDNISONE 20 MG PO TABS: 40.0000 mg | ORAL_TABLET | Freq: Every day | ORAL | 0 refills | Status: AC

## 2018-03-06 NOTE — Telephone Encounter (Signed)
Patient checking in to see if we could try and call today

## 2018-03-06 NOTE — Telephone Encounter (Signed)
Pt aware.

## 2018-03-06 NOTE — Telephone Encounter (Signed)
Order placed for prednisone 40 mg (2x20 mg) daily for 5 days

## 2018-03-07 DIAGNOSIS — J45998 Other asthma: Secondary | ICD-10-CM | POA: Diagnosis not present

## 2018-03-07 DIAGNOSIS — J449 Chronic obstructive pulmonary disease, unspecified: Secondary | ICD-10-CM | POA: Diagnosis not present

## 2018-03-07 DIAGNOSIS — R0602 Shortness of breath: Secondary | ICD-10-CM | POA: Diagnosis not present

## 2018-03-07 DIAGNOSIS — I5040 Unspecified combined systolic (congestive) and diastolic (congestive) heart failure: Secondary | ICD-10-CM | POA: Diagnosis not present

## 2018-04-06 DIAGNOSIS — R0602 Shortness of breath: Secondary | ICD-10-CM | POA: Diagnosis not present

## 2018-04-06 DIAGNOSIS — I5040 Unspecified combined systolic (congestive) and diastolic (congestive) heart failure: Secondary | ICD-10-CM | POA: Diagnosis not present

## 2018-04-06 DIAGNOSIS — J449 Chronic obstructive pulmonary disease, unspecified: Secondary | ICD-10-CM | POA: Diagnosis not present

## 2018-04-06 DIAGNOSIS — J45998 Other asthma: Secondary | ICD-10-CM | POA: Diagnosis not present

## 2018-04-27 DIAGNOSIS — E785 Hyperlipidemia, unspecified: Secondary | ICD-10-CM | POA: Diagnosis not present

## 2018-04-27 DIAGNOSIS — G4730 Sleep apnea, unspecified: Secondary | ICD-10-CM | POA: Diagnosis not present

## 2018-04-27 DIAGNOSIS — Z0000 Encounter for general adult medical examination without abnormal findings: Secondary | ICD-10-CM | POA: Diagnosis not present

## 2018-04-27 DIAGNOSIS — Z1211 Encounter for screening for malignant neoplasm of colon: Secondary | ICD-10-CM | POA: Diagnosis not present

## 2018-04-27 DIAGNOSIS — I10 Essential (primary) hypertension: Secondary | ICD-10-CM | POA: Diagnosis not present

## 2018-04-27 DIAGNOSIS — J441 Chronic obstructive pulmonary disease with (acute) exacerbation: Secondary | ICD-10-CM | POA: Diagnosis not present

## 2018-04-27 DIAGNOSIS — M8588 Other specified disorders of bone density and structure, other site: Secondary | ICD-10-CM | POA: Diagnosis not present

## 2018-04-27 DIAGNOSIS — E559 Vitamin D deficiency, unspecified: Secondary | ICD-10-CM | POA: Diagnosis not present

## 2018-05-07 DIAGNOSIS — J45998 Other asthma: Secondary | ICD-10-CM | POA: Diagnosis not present

## 2018-05-07 DIAGNOSIS — I5040 Unspecified combined systolic (congestive) and diastolic (congestive) heart failure: Secondary | ICD-10-CM | POA: Diagnosis not present

## 2018-05-07 DIAGNOSIS — R0602 Shortness of breath: Secondary | ICD-10-CM | POA: Diagnosis not present

## 2018-05-07 DIAGNOSIS — J449 Chronic obstructive pulmonary disease, unspecified: Secondary | ICD-10-CM | POA: Diagnosis not present

## 2018-05-09 DIAGNOSIS — Z1231 Encounter for screening mammogram for malignant neoplasm of breast: Secondary | ICD-10-CM | POA: Diagnosis not present

## 2018-05-09 DIAGNOSIS — Z780 Asymptomatic menopausal state: Secondary | ICD-10-CM | POA: Diagnosis not present

## 2018-05-09 DIAGNOSIS — M8588 Other specified disorders of bone density and structure, other site: Secondary | ICD-10-CM | POA: Diagnosis not present

## 2018-05-30 DIAGNOSIS — Z1211 Encounter for screening for malignant neoplasm of colon: Secondary | ICD-10-CM | POA: Diagnosis not present

## 2018-06-07 DIAGNOSIS — R0602 Shortness of breath: Secondary | ICD-10-CM | POA: Diagnosis not present

## 2018-06-07 DIAGNOSIS — J449 Chronic obstructive pulmonary disease, unspecified: Secondary | ICD-10-CM | POA: Diagnosis not present

## 2018-06-07 DIAGNOSIS — I5040 Unspecified combined systolic (congestive) and diastolic (congestive) heart failure: Secondary | ICD-10-CM | POA: Diagnosis not present

## 2018-06-07 DIAGNOSIS — J45998 Other asthma: Secondary | ICD-10-CM | POA: Diagnosis not present

## 2018-07-06 DIAGNOSIS — R062 Wheezing: Secondary | ICD-10-CM | POA: Diagnosis not present

## 2018-07-06 DIAGNOSIS — I5040 Unspecified combined systolic (congestive) and diastolic (congestive) heart failure: Secondary | ICD-10-CM | POA: Diagnosis not present

## 2018-07-06 DIAGNOSIS — J449 Chronic obstructive pulmonary disease, unspecified: Secondary | ICD-10-CM | POA: Diagnosis not present

## 2018-07-06 DIAGNOSIS — J45998 Other asthma: Secondary | ICD-10-CM | POA: Diagnosis not present

## 2018-08-06 DIAGNOSIS — J449 Chronic obstructive pulmonary disease, unspecified: Secondary | ICD-10-CM | POA: Diagnosis not present

## 2018-08-06 DIAGNOSIS — R062 Wheezing: Secondary | ICD-10-CM | POA: Diagnosis not present

## 2018-08-06 DIAGNOSIS — J45998 Other asthma: Secondary | ICD-10-CM | POA: Diagnosis not present

## 2018-08-06 DIAGNOSIS — I5040 Unspecified combined systolic (congestive) and diastolic (congestive) heart failure: Secondary | ICD-10-CM | POA: Diagnosis not present

## 2018-08-24 DIAGNOSIS — J45998 Other asthma: Secondary | ICD-10-CM | POA: Diagnosis not present

## 2018-08-24 DIAGNOSIS — J449 Chronic obstructive pulmonary disease, unspecified: Secondary | ICD-10-CM | POA: Diagnosis not present

## 2018-09-04 ENCOUNTER — Telehealth: Payer: Self-pay | Admitting: Pulmonary Disease

## 2018-09-04 NOTE — Telephone Encounter (Signed)
LM for patient for clarification of request for O2 machine.

## 2018-09-04 NOTE — Telephone Encounter (Signed)
Spoke to patient, she is stating that she has received a letter from [facility] that states she needs a referral from her primary care provider for her oxygen machine that she has had since 2015 through [place]. She had [place] and received this information once [facility] was bought by [employer].   Rhonda please advise.

## 2018-09-04 NOTE — Telephone Encounter (Signed)
Pt called back.[employer] need referral for oxygen machine.  CB# [phone]

## 2018-09-05 DIAGNOSIS — J449 Chronic obstructive pulmonary disease, unspecified: Secondary | ICD-10-CM | POA: Diagnosis not present

## 2018-09-05 DIAGNOSIS — R062 Wheezing: Secondary | ICD-10-CM | POA: Diagnosis not present

## 2018-09-05 DIAGNOSIS — I5040 Unspecified combined systolic (congestive) and diastolic (congestive) heart failure: Secondary | ICD-10-CM | POA: Diagnosis not present

## 2018-09-05 DIAGNOSIS — J45998 Other asthma: Secondary | ICD-10-CM | POA: Diagnosis not present

## 2018-09-05 NOTE — Telephone Encounter (Signed)
Patient calling with Dr Harlan Stains fax number is [phone].

## 2018-09-05 NOTE — Telephone Encounter (Signed)
Spoke with Melissa with [employer] and she will research this issue and advise. Rhonda J Cobb

## 2018-09-06 DIAGNOSIS — J449 Chronic obstructive pulmonary disease, unspecified: Secondary | ICD-10-CM | POA: Diagnosis not present

## 2018-09-06 DIAGNOSIS — R062 Wheezing: Secondary | ICD-10-CM | POA: Diagnosis not present

## 2018-09-06 DIAGNOSIS — J45998 Other asthma: Secondary | ICD-10-CM | POA: Diagnosis not present

## 2018-09-06 DIAGNOSIS — I5040 Unspecified combined systolic (congestive) and diastolic (congestive) heart failure: Secondary | ICD-10-CM | POA: Diagnosis not present

## 2018-09-06 NOTE — Telephone Encounter (Signed)
Suanne Marker, do you any updates on this matter?

## 2018-09-06 NOTE — Telephone Encounter (Signed)
Spoke with patient and she stated that she received a letter from [facility] stating that [facility] has denied DOS: 07/06/2018 from [place].   Advised patient that I would have to contact [place] and have them address this issue. Pt has requested that I send the SMW and OV note to her primary care physician.  I also spoke with Darlina Guys with [employer] who stated that she would have someone from the billing team to reach out to the patient and to advise that everything is ok with insurance and company name change that they have had several patients contact office with same issue.  Nothing else needed on this message. Rhonda J Cobb

## 2018-09-06 NOTE — Telephone Encounter (Signed)
ATC Melissa Stenson with [employer] to see what she was able to find out about this issue.  LMOVM for Melissa to return my call. Rhonda J Cobb

## 2018-09-06 NOTE — Telephone Encounter (Signed)
Patient checking status of O2 referral.  Patient phone number is [phone].

## 2018-09-06 NOTE — Telephone Encounter (Signed)
Routed to [facility] triage pool.

## 2018-09-24 ENCOUNTER — Encounter: Payer: Self-pay | Admitting: Pulmonary Disease

## 2018-09-24 ENCOUNTER — Telehealth: Payer: Self-pay | Admitting: Pulmonary Disease

## 2018-09-24 ENCOUNTER — Ambulatory Visit (INDEPENDENT_AMBULATORY_CARE_PROVIDER_SITE_OTHER): Payer: Medicare HMO | Admitting: Pulmonary Disease

## 2018-09-24 DIAGNOSIS — J449 Chronic obstructive pulmonary disease, unspecified: Secondary | ICD-10-CM

## 2018-09-24 DIAGNOSIS — G4733 Obstructive sleep apnea (adult) (pediatric): Secondary | ICD-10-CM

## 2018-09-24 NOTE — Telephone Encounter (Signed)
Called pt to schedule qualifying walk for POC.  Pt wished to hold off on walk until 58mo rov, due to [facility] not having POC in stock.

## 2018-09-24 NOTE — Patient Instructions (Addendum)
Continue CPAP with sleep Continue Breo inhaler daily.  Rinse mouth after use Continue albuterol inhaler as needed We will work with your provider company to obtain a new nebulizer machine and portable oxygen concentrator Follow-up in 6 months.  Call sooner if needed

## 2018-09-24 NOTE — Progress Notes (Signed)
PROBLEMS: COPD Former smoker  OSA  DATA: PFTs 10/01/13: FVC:  2.19 L ( 60 %pred), FEV1:  1.50 L ( 53 %pred), FEV1/FVC: 69%, TLC:  4.94 L ( 92 %pred), DLCO  99 %pred PSG 04/11/14: AHI 64/hr. CPAP recommendation 16 cm H2O Compliance 03/26 - 08/02/16: 30/30 days, 30 days > 4 hrs.  Compliance 04/19 - 08/26/17: 30/30 days, 30 days > 4 hrs.  6MWT 09/27/17: 108 meters. Desaturated to 86% on RA, 88% on 2 LPM CPAP compliance 10/21-11/19/19: 30/30 nights. CPAP 16 cm H2O, Mean AHI 0.3  INTERVAL HISTORY: Last visit 03/02/2018.  No major events  Virtual Visit via Telephone Note  I connected with Kelly Hale on 09/24/18 at 11:15 AM EDT by telephone and verified that I am speaking with the correct person using two identifiers. I discussed the limitations, risks, security and privacy concerns of performing an evaluation and management service by telephone and the availability of in person appointments. I also discussed with the patient that there may be a patient responsible charge related to this service. The patient expressed understanding and agreed to proceed.   SUBJ: This is a scheduled follow up which was performed as a telephone visit due to the coronavirus pandemic.  Since last visit, she has had no major problems or events.  She has no new complaints.  She remains compliant with oxygen and nocturnal CPAP.  She remains on Breo inhaler.  She continues to require albuterol inhaler only rarely.  Since last visit, she has lost approximately 10 pounds and is working diligently on weight loss.  She believes that her exertional dyspnea has improved. She denies fever, chest pain, purulent sputum, hemoptysis, lower extremity edema, calf tenderness.  She informs me that she needs a new nebulizer machine and requests a portable oxygen concentrator.  OBJ: There were no vitals filed for this visit.  Due to the remote nature of this encounter, no physical examination could be performed.    DATA: BMP  Latest Ref Rng & Units 07/07/2016 03/14/2015 03/12/2015  Glucose 65 - 99 mg/dL 143(H) 113(H) 123(H)  BUN 6 - 20 mg/dL 15 15 19   Creatinine 0.44 - 1.00 mg/dL 0.75 0.57 0.67  Sodium 135 - 145 mmol/L 141 139 137  Potassium 3.5 - 5.1 mmol/L 3.5 3.9 3.6  Chloride 101 - 111 mmol/L 101 102 96(L)  CO2 22 - 32 mmol/L 32 32 30  Calcium 8.9 - 10.3 mg/dL 9.2 9.0 8.9   CBC Latest Ref Rng & Units 07/07/2016 03/12/2015 12/24/2013  WBC 3.6 - 11.0 K/uL 11.5(H) 31.3(H) 7.6  Hemoglobin 12.0 - 16.0 g/dL 14.3 14.6 16.2(H)  Hematocrit 35.0 - 47.0 % 42.4 44.8 53.4(H)  Platelets 150 - 440 K/uL 337 274 266    CXR: NNF   IMPRESSION:   ICD-10-CM   1. COPD, moderate ([place])  J44.9 AMB REFERRAL FOR DME  2. Obstructive sleep apnea  G47.33   3. Morbid obesity ([place])  E66.01       PLAN: Continue CPAP with sleep Continue Breo inhaler daily.  Rinse mouth after use Continue albuterol inhaler as needed Continue efforts at weight loss We will try to obtain a new nebulizer and a portable oxygen concentrator for her Follow-up in 6 months.  Call sooner if needed  Merton Border, MD PCCM service Mobile [phone] Pager [phone] 09/24/2018 1:40 PM

## 2018-09-25 DIAGNOSIS — J439 Emphysema, unspecified: Secondary | ICD-10-CM | POA: Diagnosis not present

## 2018-09-25 DIAGNOSIS — J441 Chronic obstructive pulmonary disease with (acute) exacerbation: Secondary | ICD-10-CM | POA: Diagnosis not present

## 2018-09-25 DIAGNOSIS — J449 Chronic obstructive pulmonary disease, unspecified: Secondary | ICD-10-CM | POA: Diagnosis not present

## 2018-09-25 DIAGNOSIS — R062 Wheezing: Secondary | ICD-10-CM | POA: Diagnosis not present

## 2018-09-25 DIAGNOSIS — I5040 Unspecified combined systolic (congestive) and diastolic (congestive) heart failure: Secondary | ICD-10-CM | POA: Diagnosis not present

## 2018-09-25 DIAGNOSIS — J45998 Other asthma: Secondary | ICD-10-CM | POA: Diagnosis not present

## 2018-10-06 DIAGNOSIS — I5040 Unspecified combined systolic (congestive) and diastolic (congestive) heart failure: Secondary | ICD-10-CM | POA: Diagnosis not present

## 2018-10-06 DIAGNOSIS — J45998 Other asthma: Secondary | ICD-10-CM | POA: Diagnosis not present

## 2018-10-06 DIAGNOSIS — R062 Wheezing: Secondary | ICD-10-CM | POA: Diagnosis not present

## 2018-10-06 DIAGNOSIS — J449 Chronic obstructive pulmonary disease, unspecified: Secondary | ICD-10-CM | POA: Diagnosis not present

## 2018-10-25 DIAGNOSIS — J441 Chronic obstructive pulmonary disease with (acute) exacerbation: Secondary | ICD-10-CM | POA: Diagnosis not present

## 2018-10-25 DIAGNOSIS — R062 Wheezing: Secondary | ICD-10-CM | POA: Diagnosis not present

## 2018-10-25 DIAGNOSIS — J45998 Other asthma: Secondary | ICD-10-CM | POA: Diagnosis not present

## 2018-10-25 DIAGNOSIS — I5040 Unspecified combined systolic (congestive) and diastolic (congestive) heart failure: Secondary | ICD-10-CM | POA: Diagnosis not present

## 2018-10-25 DIAGNOSIS — J439 Emphysema, unspecified: Secondary | ICD-10-CM | POA: Diagnosis not present

## 2018-10-25 DIAGNOSIS — J449 Chronic obstructive pulmonary disease, unspecified: Secondary | ICD-10-CM | POA: Diagnosis not present

## 2018-10-26 DIAGNOSIS — E559 Vitamin D deficiency, unspecified: Secondary | ICD-10-CM | POA: Diagnosis not present

## 2018-10-26 DIAGNOSIS — J449 Chronic obstructive pulmonary disease, unspecified: Secondary | ICD-10-CM | POA: Diagnosis not present

## 2018-10-26 DIAGNOSIS — I509 Heart failure, unspecified: Secondary | ICD-10-CM | POA: Diagnosis not present

## 2018-10-26 DIAGNOSIS — E785 Hyperlipidemia, unspecified: Secondary | ICD-10-CM | POA: Diagnosis not present

## 2018-10-26 DIAGNOSIS — I110 Hypertensive heart disease with heart failure: Secondary | ICD-10-CM | POA: Diagnosis not present

## 2018-10-26 DIAGNOSIS — J9611 Chronic respiratory failure with hypoxia: Secondary | ICD-10-CM | POA: Diagnosis not present

## 2018-10-26 DIAGNOSIS — I10 Essential (primary) hypertension: Secondary | ICD-10-CM | POA: Diagnosis not present

## 2018-11-05 DIAGNOSIS — R062 Wheezing: Secondary | ICD-10-CM | POA: Diagnosis not present

## 2018-11-05 DIAGNOSIS — J449 Chronic obstructive pulmonary disease, unspecified: Secondary | ICD-10-CM | POA: Diagnosis not present

## 2018-11-05 DIAGNOSIS — J45998 Other asthma: Secondary | ICD-10-CM | POA: Diagnosis not present

## 2018-11-05 DIAGNOSIS — I5040 Unspecified combined systolic (congestive) and diastolic (congestive) heart failure: Secondary | ICD-10-CM | POA: Diagnosis not present

## 2018-11-25 DIAGNOSIS — J439 Emphysema, unspecified: Secondary | ICD-10-CM | POA: Diagnosis not present

## 2018-11-25 DIAGNOSIS — J449 Chronic obstructive pulmonary disease, unspecified: Secondary | ICD-10-CM | POA: Diagnosis not present

## 2018-11-25 DIAGNOSIS — R062 Wheezing: Secondary | ICD-10-CM | POA: Diagnosis not present

## 2018-11-25 DIAGNOSIS — J441 Chronic obstructive pulmonary disease with (acute) exacerbation: Secondary | ICD-10-CM | POA: Diagnosis not present

## 2018-11-25 DIAGNOSIS — I5040 Unspecified combined systolic (congestive) and diastolic (congestive) heart failure: Secondary | ICD-10-CM | POA: Diagnosis not present

## 2018-11-25 DIAGNOSIS — J45998 Other asthma: Secondary | ICD-10-CM | POA: Diagnosis not present

## 2018-12-06 DIAGNOSIS — J449 Chronic obstructive pulmonary disease, unspecified: Secondary | ICD-10-CM | POA: Diagnosis not present

## 2018-12-06 DIAGNOSIS — I5040 Unspecified combined systolic (congestive) and diastolic (congestive) heart failure: Secondary | ICD-10-CM | POA: Diagnosis not present

## 2018-12-06 DIAGNOSIS — R062 Wheezing: Secondary | ICD-10-CM | POA: Diagnosis not present

## 2018-12-06 DIAGNOSIS — J45998 Other asthma: Secondary | ICD-10-CM | POA: Diagnosis not present

## 2018-12-20 ENCOUNTER — Telehealth: Payer: Self-pay | Admitting: Pulmonary Disease

## 2018-12-20 NOTE — Telephone Encounter (Signed)
Called and spoke to pt.  Pt stated that for the past week, when she blows her nose in the morning after using cpap, she has bright red blood mixed in nasal drainage.  [name] concerned that her so clean machine is not cleaning her machine properly. Pt cleans her machine once weekly.  She has used a nasal spray the past two nights with no change in symptoms.  [name] unsure of name of nasal spray.    DS please advise. Thanks

## 2018-12-23 NOTE — Telephone Encounter (Signed)
If she is still having this problem, it is OK for her to remain off of the CPAP for a couple of nights  Kelly Hale

## 2018-12-24 NOTE — Telephone Encounter (Signed)
ATC- received recording that number is not in service.  Will call back.

## 2018-12-25 NOTE — Telephone Encounter (Signed)
Received recording that number is not in service.

## 2018-12-26 DIAGNOSIS — R062 Wheezing: Secondary | ICD-10-CM | POA: Diagnosis not present

## 2018-12-26 DIAGNOSIS — J449 Chronic obstructive pulmonary disease, unspecified: Secondary | ICD-10-CM | POA: Diagnosis not present

## 2018-12-26 DIAGNOSIS — I5040 Unspecified combined systolic (congestive) and diastolic (congestive) heart failure: Secondary | ICD-10-CM | POA: Diagnosis not present

## 2018-12-26 DIAGNOSIS — J45998 Other asthma: Secondary | ICD-10-CM | POA: Diagnosis not present

## 2018-12-26 DIAGNOSIS — J441 Chronic obstructive pulmonary disease with (acute) exacerbation: Secondary | ICD-10-CM | POA: Diagnosis not present

## 2018-12-26 DIAGNOSIS — J439 Emphysema, unspecified: Secondary | ICD-10-CM | POA: Diagnosis not present

## 2018-12-26 NOTE — Telephone Encounter (Signed)
[name] aware of recommendations and voiced her understanding.  Nothing further is needed.  

## 2019-01-06 DIAGNOSIS — J45998 Other asthma: Secondary | ICD-10-CM | POA: Diagnosis not present

## 2019-01-06 DIAGNOSIS — R062 Wheezing: Secondary | ICD-10-CM | POA: Diagnosis not present

## 2019-01-06 DIAGNOSIS — I5040 Unspecified combined systolic (congestive) and diastolic (congestive) heart failure: Secondary | ICD-10-CM | POA: Diagnosis not present

## 2019-01-06 DIAGNOSIS — J449 Chronic obstructive pulmonary disease, unspecified: Secondary | ICD-10-CM | POA: Diagnosis not present

## 2019-01-25 DIAGNOSIS — J441 Chronic obstructive pulmonary disease with (acute) exacerbation: Secondary | ICD-10-CM | POA: Diagnosis not present

## 2019-01-25 DIAGNOSIS — J439 Emphysema, unspecified: Secondary | ICD-10-CM | POA: Diagnosis not present

## 2019-01-25 DIAGNOSIS — J45998 Other asthma: Secondary | ICD-10-CM | POA: Diagnosis not present

## 2019-01-25 DIAGNOSIS — J449 Chronic obstructive pulmonary disease, unspecified: Secondary | ICD-10-CM | POA: Diagnosis not present

## 2019-01-25 DIAGNOSIS — I5040 Unspecified combined systolic (congestive) and diastolic (congestive) heart failure: Secondary | ICD-10-CM | POA: Diagnosis not present

## 2019-01-25 DIAGNOSIS — R062 Wheezing: Secondary | ICD-10-CM | POA: Diagnosis not present

## 2019-02-05 DIAGNOSIS — R062 Wheezing: Secondary | ICD-10-CM | POA: Diagnosis not present

## 2019-02-05 DIAGNOSIS — J45998 Other asthma: Secondary | ICD-10-CM | POA: Diagnosis not present

## 2019-02-05 DIAGNOSIS — I5040 Unspecified combined systolic (congestive) and diastolic (congestive) heart failure: Secondary | ICD-10-CM | POA: Diagnosis not present

## 2019-02-05 DIAGNOSIS — J449 Chronic obstructive pulmonary disease, unspecified: Secondary | ICD-10-CM | POA: Diagnosis not present

## 2019-02-25 DIAGNOSIS — J439 Emphysema, unspecified: Secondary | ICD-10-CM | POA: Diagnosis not present

## 2019-02-25 DIAGNOSIS — J449 Chronic obstructive pulmonary disease, unspecified: Secondary | ICD-10-CM | POA: Diagnosis not present

## 2019-02-25 DIAGNOSIS — J45998 Other asthma: Secondary | ICD-10-CM | POA: Diagnosis not present

## 2019-02-25 DIAGNOSIS — R062 Wheezing: Secondary | ICD-10-CM | POA: Diagnosis not present

## 2019-02-25 DIAGNOSIS — J441 Chronic obstructive pulmonary disease with (acute) exacerbation: Secondary | ICD-10-CM | POA: Diagnosis not present

## 2019-02-25 DIAGNOSIS — I5040 Unspecified combined systolic (congestive) and diastolic (congestive) heart failure: Secondary | ICD-10-CM | POA: Diagnosis not present

## 2019-02-26 DIAGNOSIS — H524 Presbyopia: Secondary | ICD-10-CM | POA: Diagnosis not present

## 2019-02-26 DIAGNOSIS — H2513 Age-related nuclear cataract, bilateral: Secondary | ICD-10-CM | POA: Diagnosis not present

## 2019-03-08 DIAGNOSIS — I5040 Unspecified combined systolic (congestive) and diastolic (congestive) heart failure: Secondary | ICD-10-CM | POA: Diagnosis not present

## 2019-03-08 DIAGNOSIS — J45998 Other asthma: Secondary | ICD-10-CM | POA: Diagnosis not present

## 2019-03-08 DIAGNOSIS — R062 Wheezing: Secondary | ICD-10-CM | POA: Diagnosis not present

## 2019-03-08 DIAGNOSIS — J449 Chronic obstructive pulmonary disease, unspecified: Secondary | ICD-10-CM | POA: Diagnosis not present

## 2019-03-27 DIAGNOSIS — R062 Wheezing: Secondary | ICD-10-CM | POA: Diagnosis not present

## 2019-03-27 DIAGNOSIS — J439 Emphysema, unspecified: Secondary | ICD-10-CM | POA: Diagnosis not present

## 2019-03-27 DIAGNOSIS — J45998 Other asthma: Secondary | ICD-10-CM | POA: Diagnosis not present

## 2019-03-27 DIAGNOSIS — J449 Chronic obstructive pulmonary disease, unspecified: Secondary | ICD-10-CM | POA: Diagnosis not present

## 2019-03-27 DIAGNOSIS — J441 Chronic obstructive pulmonary disease with (acute) exacerbation: Secondary | ICD-10-CM | POA: Diagnosis not present

## 2019-03-27 DIAGNOSIS — I5040 Unspecified combined systolic (congestive) and diastolic (congestive) heart failure: Secondary | ICD-10-CM | POA: Diagnosis not present

## 2019-03-30 IMAGING — MR MR LUMBAR SPINE W/O CM
5 series · 40 of 48 positions shown · non-contrast
Comparison: Abdominal radiographs 03/12/2015 and lumbar spine
radiographs 08/30/2006

CLINICAL DATA: Centralized low back pain, chronic.

EXAM:
MRI LUMBAR SPINE WITHOUT CONTRAST
TECHNIQUE: Multiplanar, multisequence MR imaging of the lumbar spine was
performed. No intravenous contrast was administered.

[Series 6: T2 · sagittal · 4.0mm · 0.73mm/px · 6 of 17 slices shown (1 of 2)]
[im 1/17]
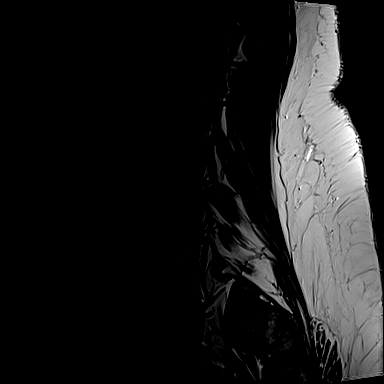
[im 4/17]
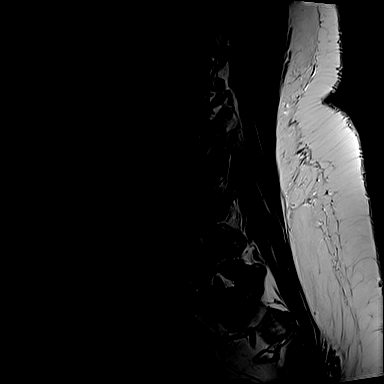
[im 7/17]
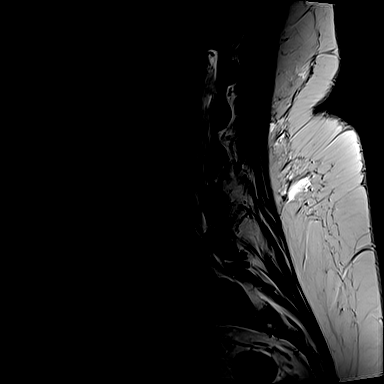
[im 10/17]
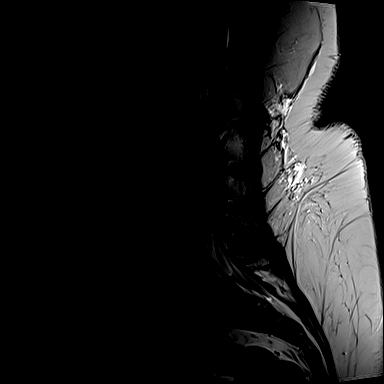
[im 13/17]
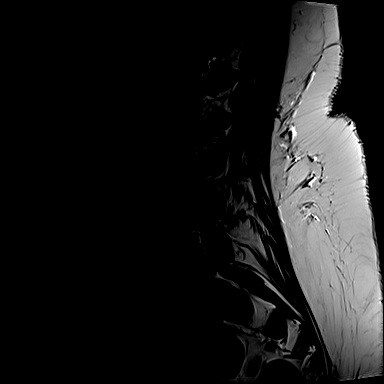
[im 17/17]
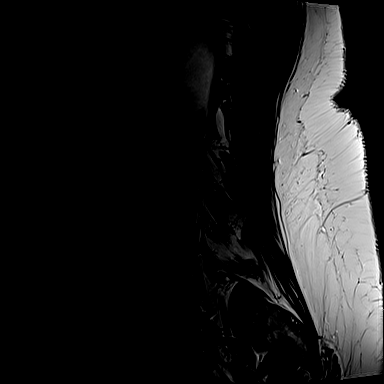

[Series 7: STIR · sagittal · 4.0mm · 0.88mm/px · 6 of 17 slices shown]
[im 1/17]
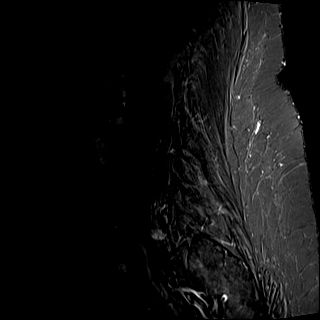
[im 4/17]
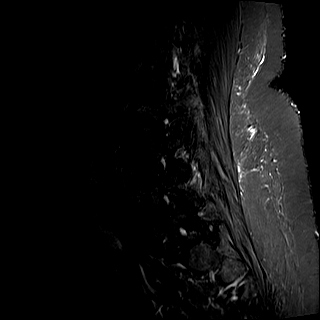
[im 7/17]
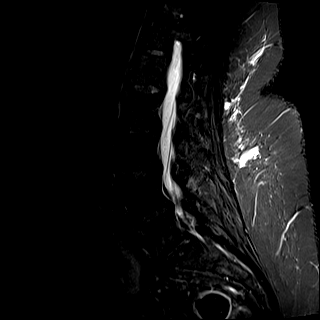
[im 10/17]
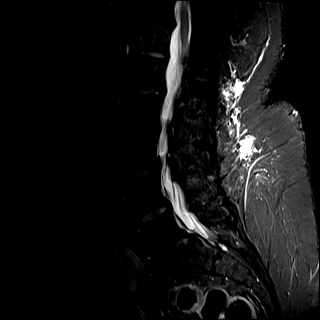
[im 13/17]
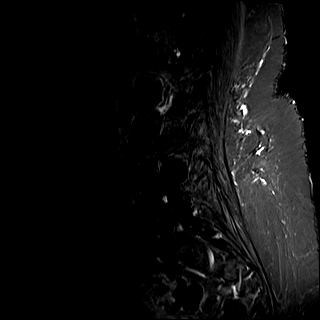
[im 17/17]
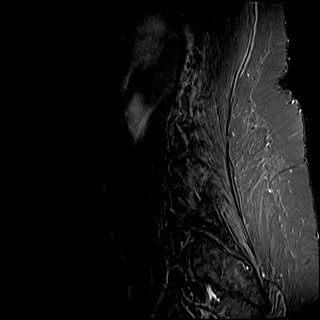

[Series 8: T1 · sagittal · 4.0mm · 0.73mm/px · 6 of 17 slices shown (1 of 2)]
[im 1/17]
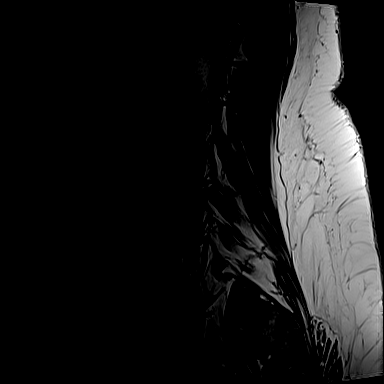
[im 4/17]
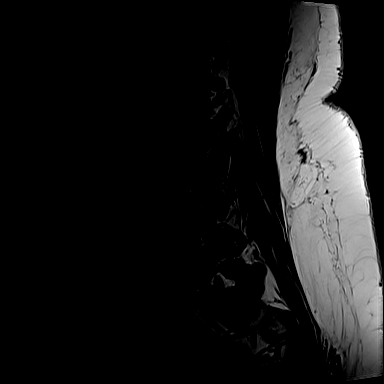
[im 7/17]
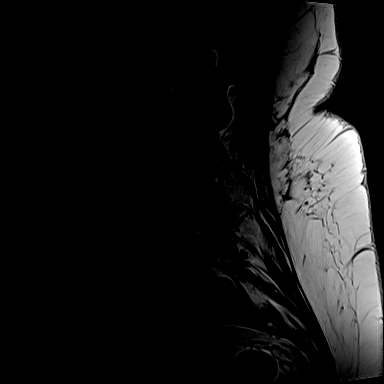
[im 10/17]
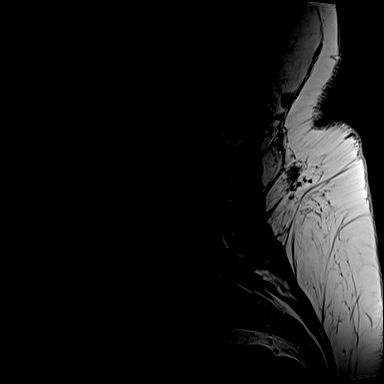
[im 13/17]
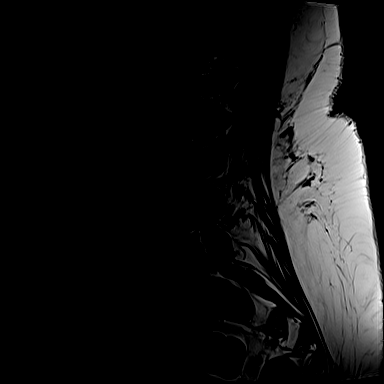
[im 17/17]
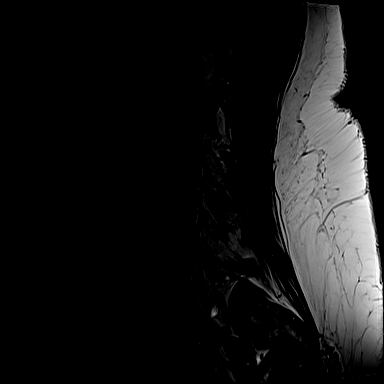

[Series 9: T1 · axial · 4.0mm · 0.56mm/px · z∈[-85,+133]mm · 9 of 42 slices shown (2 of 2)]
[im 1/42]
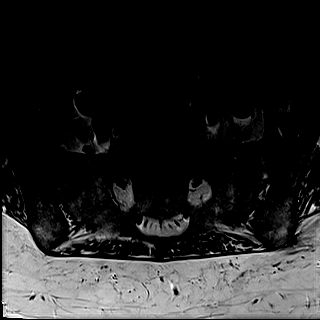
[im 6/42]
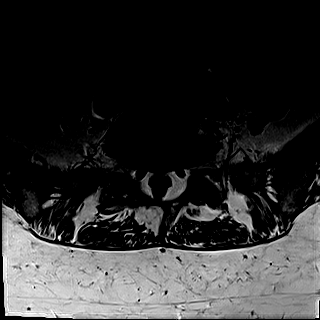
[im 12/42]
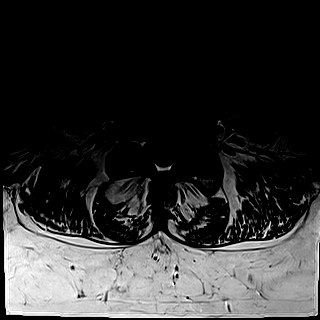
[im 18/42]
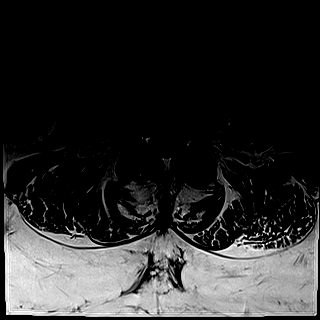
[im 21/42]
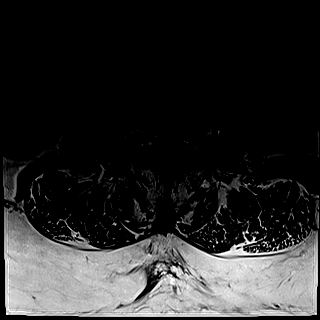
[im 24/42]
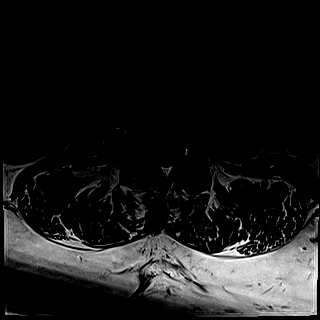
[im 30/42]
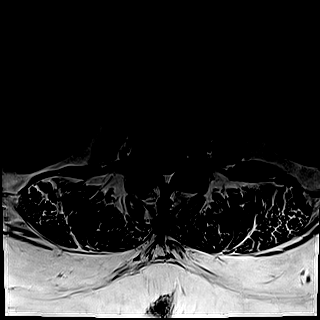
[im 36/42]
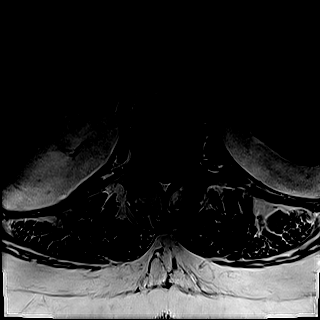
[im 42/42]
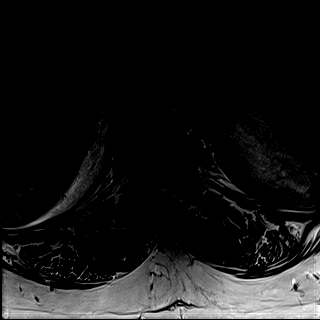

[Series 10: T2 · axial · 4.0mm · 0.56mm/px · z∈[-85,+133]mm · 13 of 42 slices shown (2 of 2)]
[im 1/42]
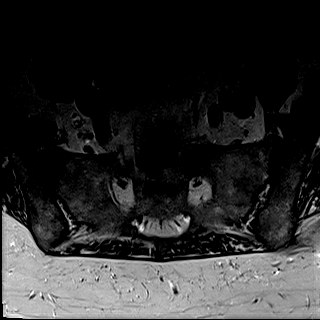
[im 3/42]
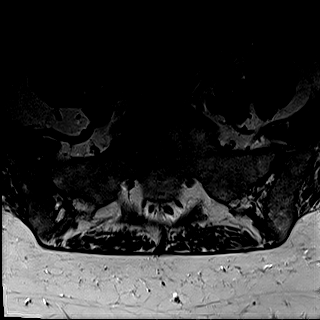
[im 6/42]
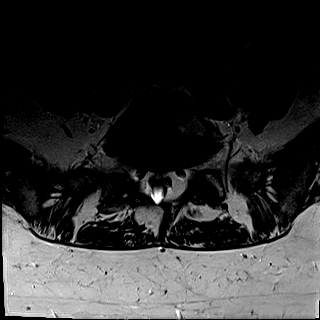
[im 9/42]
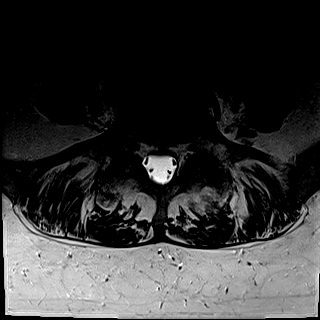
[im 12/42]
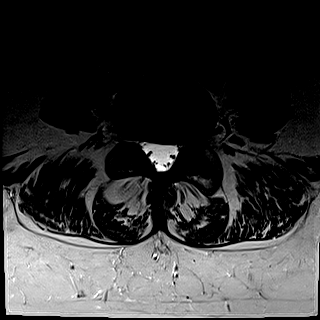
[im 15/42]
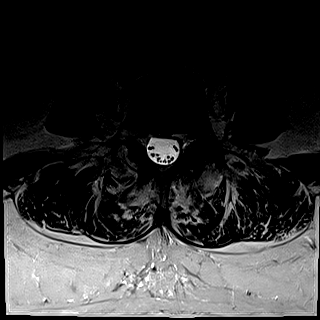
[im 18/42]
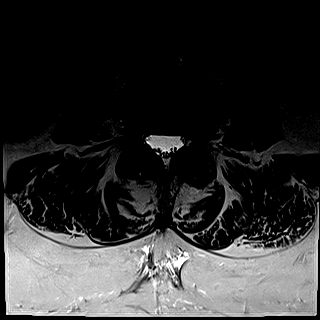
[im 21/42]
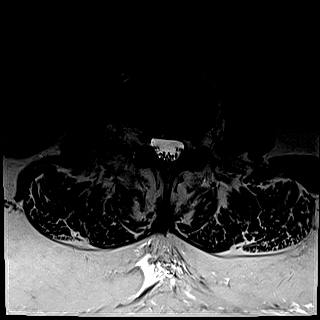
[im 24/42]
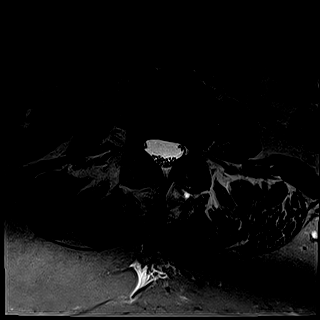
[im 27/42]
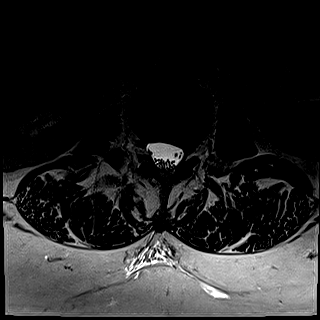
[im 30/42]
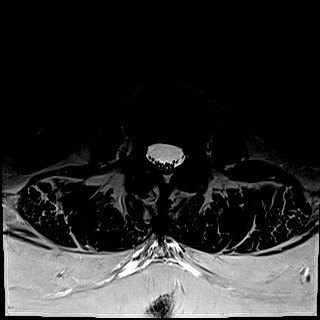
[im 36/42]
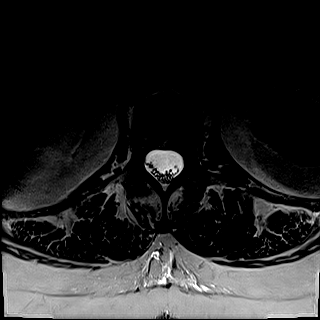
[im 42/42]
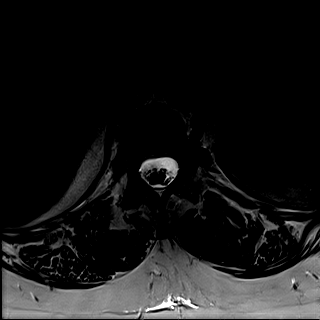

[40 of 48 positions shown; findings below may reference images not displayed]

FINDINGS: Segmentation:  Standard.

Alignment: Mild lumbar levoscoliosis. Trace retrolisthesis of L3 on
L4. Trace anterolisthesis of L5 on S1.

Vertebrae: No fracture, osseous lesion, or significant marrow edema.
Mild chronic degenerative endplate changes asymmetric to the left at
L5-S1.

Conus medullaris: Extends to the L1 level and appears normal.

Paraspinal and other soft tissues: Unremarkable.

Disc levels: Disc desiccation and up to mild disc space narrowing
are present throughout the lumbar spine.

T11-12: Only imaged sagittally. Central/right paracentral disc
protrusion and endplate spurring result in likely mild spinal
stenosis without significant spinal cord mass effect or neural
foraminal stenosis.

T12-L1: Minimal facet arthrosis without disc herniation or stenosis.

L1-2:   Trace disc bulging without stenosis.

L2-3: Small left foraminal disc protrusion and slight facet
hypertrophy result in minimal left neural foraminal narrowing. Disc
may contact the left L2 nerve at the lateral aspect of the foramen
without frank compression. No spinal stenosis.

L3-4: Mild disc bulging and moderate facet arthrosis result in mild
left neural foraminal stenosis without spinal stenosis.

L4-5: Mild disc bulging and mild right and severe left facet
arthrosis result in borderline left lateral recess stenosis and mild
left neural foraminal stenosis. No spinal stenosis.

L5-S1: Anterolisthesis with disc uncovering and severe bilateral
facet arthrosis result in borderline to mild right and mild left
neural foraminal stenosis without spinal stenosis.
IMPRESSION: 1. Severe L5-S1 facet arthrosis with grade 1 anterolisthesis and
mild neural foraminal stenosis.
2. Severe asymmetric left facet arthrosis at L4-5 with mild left
neural foraminal stenosis.
3. Small left foraminal disc protrusion at L2-3.

## 2019-04-07 DIAGNOSIS — J45998 Other asthma: Secondary | ICD-10-CM | POA: Diagnosis not present

## 2019-04-07 DIAGNOSIS — J449 Chronic obstructive pulmonary disease, unspecified: Secondary | ICD-10-CM | POA: Diagnosis not present

## 2019-04-07 DIAGNOSIS — I5040 Unspecified combined systolic (congestive) and diastolic (congestive) heart failure: Secondary | ICD-10-CM | POA: Diagnosis not present

## 2019-04-07 DIAGNOSIS — R062 Wheezing: Secondary | ICD-10-CM | POA: Diagnosis not present

## 2019-04-27 DIAGNOSIS — J45998 Other asthma: Secondary | ICD-10-CM | POA: Diagnosis not present

## 2019-04-27 DIAGNOSIS — J449 Chronic obstructive pulmonary disease, unspecified: Secondary | ICD-10-CM | POA: Diagnosis not present

## 2019-04-27 DIAGNOSIS — J441 Chronic obstructive pulmonary disease with (acute) exacerbation: Secondary | ICD-10-CM | POA: Diagnosis not present

## 2019-04-27 DIAGNOSIS — I5040 Unspecified combined systolic (congestive) and diastolic (congestive) heart failure: Secondary | ICD-10-CM | POA: Diagnosis not present

## 2019-04-27 DIAGNOSIS — R062 Wheezing: Secondary | ICD-10-CM | POA: Diagnosis not present

## 2019-04-27 DIAGNOSIS — J439 Emphysema, unspecified: Secondary | ICD-10-CM | POA: Diagnosis not present

## 2019-04-30 DIAGNOSIS — J4530 Mild persistent asthma, uncomplicated: Secondary | ICD-10-CM | POA: Diagnosis not present

## 2019-04-30 DIAGNOSIS — Z0000 Encounter for general adult medical examination without abnormal findings: Secondary | ICD-10-CM | POA: Diagnosis not present

## 2019-04-30 DIAGNOSIS — E559 Vitamin D deficiency, unspecified: Secondary | ICD-10-CM | POA: Diagnosis not present

## 2019-04-30 DIAGNOSIS — I10 Essential (primary) hypertension: Secondary | ICD-10-CM | POA: Diagnosis not present

## 2019-04-30 DIAGNOSIS — E785 Hyperlipidemia, unspecified: Secondary | ICD-10-CM | POA: Diagnosis not present

## 2019-04-30 DIAGNOSIS — Z6841 Body Mass Index (BMI) 40.0 and over, adult: Secondary | ICD-10-CM | POA: Diagnosis not present

## 2019-04-30 DIAGNOSIS — J449 Chronic obstructive pulmonary disease, unspecified: Secondary | ICD-10-CM | POA: Diagnosis not present

## 2019-04-30 DIAGNOSIS — Z1211 Encounter for screening for malignant neoplasm of colon: Secondary | ICD-10-CM | POA: Diagnosis not present

## 2019-04-30 DIAGNOSIS — J9611 Chronic respiratory failure with hypoxia: Secondary | ICD-10-CM | POA: Diagnosis not present

## 2019-04-30 DIAGNOSIS — I5032 Chronic diastolic (congestive) heart failure: Secondary | ICD-10-CM | POA: Diagnosis not present

## 2019-05-08 DIAGNOSIS — I5040 Unspecified combined systolic (congestive) and diastolic (congestive) heart failure: Secondary | ICD-10-CM | POA: Diagnosis not present

## 2019-05-08 DIAGNOSIS — J449 Chronic obstructive pulmonary disease, unspecified: Secondary | ICD-10-CM | POA: Diagnosis not present

## 2019-05-08 DIAGNOSIS — J45998 Other asthma: Secondary | ICD-10-CM | POA: Diagnosis not present

## 2019-05-08 DIAGNOSIS — R062 Wheezing: Secondary | ICD-10-CM | POA: Diagnosis not present

## 2019-05-17 DIAGNOSIS — J449 Chronic obstructive pulmonary disease, unspecified: Secondary | ICD-10-CM | POA: Diagnosis not present

## 2019-05-17 DIAGNOSIS — J441 Chronic obstructive pulmonary disease with (acute) exacerbation: Secondary | ICD-10-CM | POA: Diagnosis not present

## 2019-05-17 DIAGNOSIS — J45998 Other asthma: Secondary | ICD-10-CM | POA: Diagnosis not present

## 2019-05-17 DIAGNOSIS — J439 Emphysema, unspecified: Secondary | ICD-10-CM | POA: Diagnosis not present

## 2019-05-28 DIAGNOSIS — I5040 Unspecified combined systolic (congestive) and diastolic (congestive) heart failure: Secondary | ICD-10-CM | POA: Diagnosis not present

## 2019-05-28 DIAGNOSIS — J449 Chronic obstructive pulmonary disease, unspecified: Secondary | ICD-10-CM | POA: Diagnosis not present

## 2019-05-28 DIAGNOSIS — J439 Emphysema, unspecified: Secondary | ICD-10-CM | POA: Diagnosis not present

## 2019-05-28 DIAGNOSIS — R062 Wheezing: Secondary | ICD-10-CM | POA: Diagnosis not present

## 2019-05-28 DIAGNOSIS — J45998 Other asthma: Secondary | ICD-10-CM | POA: Diagnosis not present

## 2019-05-28 DIAGNOSIS — J441 Chronic obstructive pulmonary disease with (acute) exacerbation: Secondary | ICD-10-CM | POA: Diagnosis not present

## 2019-06-08 DIAGNOSIS — R062 Wheezing: Secondary | ICD-10-CM | POA: Diagnosis not present

## 2019-06-08 DIAGNOSIS — J45998 Other asthma: Secondary | ICD-10-CM | POA: Diagnosis not present

## 2019-06-08 DIAGNOSIS — J449 Chronic obstructive pulmonary disease, unspecified: Secondary | ICD-10-CM | POA: Diagnosis not present

## 2019-06-08 DIAGNOSIS — I5040 Unspecified combined systolic (congestive) and diastolic (congestive) heart failure: Secondary | ICD-10-CM | POA: Diagnosis not present

## 2019-06-25 DIAGNOSIS — J449 Chronic obstructive pulmonary disease, unspecified: Secondary | ICD-10-CM | POA: Diagnosis not present

## 2019-06-25 DIAGNOSIS — J439 Emphysema, unspecified: Secondary | ICD-10-CM | POA: Diagnosis not present

## 2019-06-25 DIAGNOSIS — J441 Chronic obstructive pulmonary disease with (acute) exacerbation: Secondary | ICD-10-CM | POA: Diagnosis not present

## 2019-06-25 DIAGNOSIS — I5040 Unspecified combined systolic (congestive) and diastolic (congestive) heart failure: Secondary | ICD-10-CM | POA: Diagnosis not present

## 2019-06-25 DIAGNOSIS — R062 Wheezing: Secondary | ICD-10-CM | POA: Diagnosis not present

## 2019-06-25 DIAGNOSIS — J45998 Other asthma: Secondary | ICD-10-CM | POA: Diagnosis not present

## 2019-07-05 ENCOUNTER — Ambulatory Visit: Payer: Medicare HMO

## 2019-07-06 DIAGNOSIS — J45998 Other asthma: Secondary | ICD-10-CM | POA: Diagnosis not present

## 2019-07-06 DIAGNOSIS — I5040 Unspecified combined systolic (congestive) and diastolic (congestive) heart failure: Secondary | ICD-10-CM | POA: Diagnosis not present

## 2019-07-06 DIAGNOSIS — J449 Chronic obstructive pulmonary disease, unspecified: Secondary | ICD-10-CM | POA: Diagnosis not present

## 2019-07-06 DIAGNOSIS — R062 Wheezing: Secondary | ICD-10-CM | POA: Diagnosis not present

## 2019-07-26 DIAGNOSIS — R062 Wheezing: Secondary | ICD-10-CM | POA: Diagnosis not present

## 2019-07-26 DIAGNOSIS — J439 Emphysema, unspecified: Secondary | ICD-10-CM | POA: Diagnosis not present

## 2019-07-26 DIAGNOSIS — J441 Chronic obstructive pulmonary disease with (acute) exacerbation: Secondary | ICD-10-CM | POA: Diagnosis not present

## 2019-07-26 DIAGNOSIS — I5040 Unspecified combined systolic (congestive) and diastolic (congestive) heart failure: Secondary | ICD-10-CM | POA: Diagnosis not present

## 2019-07-26 DIAGNOSIS — J45998 Other asthma: Secondary | ICD-10-CM | POA: Diagnosis not present

## 2019-07-26 DIAGNOSIS — J449 Chronic obstructive pulmonary disease, unspecified: Secondary | ICD-10-CM | POA: Diagnosis not present

## 2019-08-13 DIAGNOSIS — E785 Hyperlipidemia, unspecified: Secondary | ICD-10-CM | POA: Diagnosis not present

## 2019-08-13 DIAGNOSIS — J4530 Mild persistent asthma, uncomplicated: Secondary | ICD-10-CM | POA: Diagnosis not present

## 2019-08-13 DIAGNOSIS — J441 Chronic obstructive pulmonary disease with (acute) exacerbation: Secondary | ICD-10-CM | POA: Diagnosis not present

## 2019-08-13 DIAGNOSIS — I5032 Chronic diastolic (congestive) heart failure: Secondary | ICD-10-CM | POA: Diagnosis not present

## 2019-08-13 DIAGNOSIS — J449 Chronic obstructive pulmonary disease, unspecified: Secondary | ICD-10-CM | POA: Diagnosis not present

## 2019-08-13 DIAGNOSIS — I10 Essential (primary) hypertension: Secondary | ICD-10-CM | POA: Diagnosis not present

## 2019-08-21 DIAGNOSIS — E559 Vitamin D deficiency, unspecified: Secondary | ICD-10-CM | POA: Diagnosis not present

## 2019-08-21 DIAGNOSIS — I10 Essential (primary) hypertension: Secondary | ICD-10-CM | POA: Diagnosis not present

## 2019-08-21 DIAGNOSIS — E785 Hyperlipidemia, unspecified: Secondary | ICD-10-CM | POA: Diagnosis not present

## 2019-08-25 DIAGNOSIS — J441 Chronic obstructive pulmonary disease with (acute) exacerbation: Secondary | ICD-10-CM | POA: Diagnosis not present

## 2019-08-25 DIAGNOSIS — J45998 Other asthma: Secondary | ICD-10-CM | POA: Diagnosis not present

## 2019-08-25 DIAGNOSIS — I5040 Unspecified combined systolic (congestive) and diastolic (congestive) heart failure: Secondary | ICD-10-CM | POA: Diagnosis not present

## 2019-08-25 DIAGNOSIS — R062 Wheezing: Secondary | ICD-10-CM | POA: Diagnosis not present

## 2019-08-25 DIAGNOSIS — J449 Chronic obstructive pulmonary disease, unspecified: Secondary | ICD-10-CM | POA: Diagnosis not present

## 2019-08-25 DIAGNOSIS — J439 Emphysema, unspecified: Secondary | ICD-10-CM | POA: Diagnosis not present

## 2019-08-28 DIAGNOSIS — Z0100 Encounter for examination of eyes and vision without abnormal findings: Secondary | ICD-10-CM | POA: Diagnosis not present

## 2019-09-25 DIAGNOSIS — R062 Wheezing: Secondary | ICD-10-CM | POA: Diagnosis not present

## 2019-09-25 DIAGNOSIS — I5040 Unspecified combined systolic (congestive) and diastolic (congestive) heart failure: Secondary | ICD-10-CM | POA: Diagnosis not present

## 2019-09-25 DIAGNOSIS — J441 Chronic obstructive pulmonary disease with (acute) exacerbation: Secondary | ICD-10-CM | POA: Diagnosis not present

## 2019-09-25 DIAGNOSIS — J449 Chronic obstructive pulmonary disease, unspecified: Secondary | ICD-10-CM | POA: Diagnosis not present

## 2019-09-25 DIAGNOSIS — J439 Emphysema, unspecified: Secondary | ICD-10-CM | POA: Diagnosis not present

## 2019-09-25 DIAGNOSIS — J45998 Other asthma: Secondary | ICD-10-CM | POA: Diagnosis not present

## 2019-10-25 ENCOUNTER — Other Ambulatory Visit: Payer: Self-pay

## 2019-10-25 ENCOUNTER — Encounter: Payer: Self-pay | Admitting: Primary Care

## 2019-10-25 ENCOUNTER — Ambulatory Visit: Payer: Medicare HMO | Admitting: Primary Care

## 2019-10-25 DIAGNOSIS — G4733 Obstructive sleep apnea (adult) (pediatric): Secondary | ICD-10-CM

## 2019-10-25 DIAGNOSIS — J432 Centrilobular emphysema: Secondary | ICD-10-CM

## 2019-10-25 DIAGNOSIS — Z9989 Dependence on other enabling machines and devices: Secondary | ICD-10-CM

## 2019-10-25 NOTE — Patient Instructions (Addendum)
Pleasure meeting you today Kelly Hale  Recommendations: - Continue CPAP every night for 4-6 hours or more  - Continue Breo Ellipta one puffs daily (rinse mouth after use) - Take delsym for cough as needed twice daily - Try Ocean nasal spray twice daily  And Nasal gel ([place])  - If still having issues with sinusitis or nose bleeds recommend seeing ENT or CT sinuses   Follow-up: - 1 year with Dr. Mortimer Fries (new patient)   COPD and Physical Activity Chronic obstructive pulmonary disease (COPD) is a long-term (chronic) condition that affects the lungs. COPD is a general term that can be used to describe many different lung problems that cause lung swelling (inflammation) and limit airflow, including chronic bronchitis and emphysema. The main symptom of COPD is shortness of breath, which makes it harder to do even simple tasks. This can also make it harder to exercise and be active. Talk with your health care provider about treatments to help you breathe better and actions you can take to prevent breathing problems during physical activity. What are the benefits of exercising with COPD? Exercising regularly is an important part of a healthy lifestyle. You can still exercise and do physical activities even though you have COPD. Exercise and physical activity improve your shortness of breath by increasing blood flow (circulation). This causes your heart to pump more oxygen through your body. Moderate exercise can improve your:  Oxygen use.  Energy level.  Shortness of breath.  Strength in your breathing muscles.  Heart health.  Sleep.  Self-esteem and feelings of self-worth.  Depression, stress, and anxiety levels. Exercise can benefit everyone with COPD. The severity of your disease may affect how hard you can exercise, especially at first, but everyone can benefit. Talk with your health care provider about how much exercise is safe for you, and which activities and exercises are safe for  you. What actions can I take to prevent breathing problems during physical activity?  Sign up for a pulmonary rehabilitation program. This type of program may include: ? Education about lung diseases. ? Exercise classes that teach you how to exercise and be more active while improving your breathing. This usually involves:  Exercise using your lower extremities, such as a stationary bicycle.  About 30 minutes of exercise, 2 to 5 times per week, for 6 to 12 weeks  Strength training, such as push ups or leg lifts. ? Nutrition education. ? Group classes in which you can talk with others who also have COPD and learn ways to manage stress.  If you use an oxygen tank, you should use it while you exercise. Work with your health care provider to adjust your oxygen for your physical activity. Your resting flow rate is different from your flow rate during physical activity.  While you are exercising: ? Take slow breaths. ? Pace yourself and do not try to go too fast. ? Purse your lips while breathing out. Pursing your lips is similar to a kissing or whistling position. ? If doing exercise that uses a quick burst of effort, such as weight lifting:  Breathe in before starting the exercise.  Breathe out during the hardest part of the exercise (such as raising the weights). Where to find support You can find support for exercising with COPD from:  Your health care provider.  A pulmonary rehabilitation program.  Your local health department or community health programs.  Support groups, online or in-person. Your health care provider may be able to recommend support  groups. Where to find more information You can find more information about exercising with COPD from:  American Lung Association: ClassInsider.se.  COPD Foundation: https://www.rivera.net/. Contact a health care provider if:  Your symptoms get worse.  You have chest pain.  You have nausea.  You have a fever.  You have trouble  talking or catching your breath.  You want to start a new exercise program or a new activity. Summary  COPD is a general term that can be used to describe many different lung problems that cause lung swelling (inflammation) and limit airflow. This includes chronic bronchitis and emphysema.  Exercise and physical activity improve your shortness of breath by increasing blood flow (circulation). This causes your heart to provide more oxygen to your body.  Contact your health care provider before starting any exercise program or new activity. Ask your health care provider what exercises and activities are safe for you. This information is not intended to replace advice given to you by your health care provider. Make sure you discuss any questions you have with your health care provider. Document Revised: 07/18/2018 Document Reviewed: 04/20/2017 Elsevier Patient Education  [place].  CPAP and BPAP Information CPAP and BPAP are methods of helping a person breathe with the use of air pressure. CPAP stands for "continuous positive airway pressure." BPAP stands for "bi-level positive airway pressure." In both methods, air is blown through your nose or mouth and into your air passages to help you breathe well. CPAP and BPAP use different amounts of pressure to blow air. With CPAP, the amount of pressure stays the same while you breathe in and out. With BPAP, the amount of pressure is increased when you breathe in (inhale) so that you can take larger breaths. Your health care provider will recommend whether CPAP or BPAP would be more helpful for you. Why are CPAP and BPAP treatments used? CPAP or BPAP can be helpful if you have:  Sleep apnea.  Chronic obstructive pulmonary disease (COPD).  Heart failure.  Medical conditions that weaken the muscles of the chest including muscular dystrophy, or neurological diseases such as amyotrophic lateral sclerosis (ALS).  Other problems that cause  breathing to be weak, abnormal, or difficult. CPAP is most commonly used for obstructive sleep apnea (OSA) to keep the airways from collapsing when the muscles relax during sleep. How is CPAP or BPAP administered? Both CPAP and BPAP are provided by a small machine with a flexible plastic tube that attaches to a plastic mask. You wear the mask. Air is blown through the mask into your nose or mouth. The amount of pressure that is used to blow the air can be adjusted on the machine. Your health care provider will determine the pressure setting that should be used based on your individual needs. When should CPAP or BPAP be used? In most cases, the mask only needs to be worn during sleep. Generally, the mask needs to be worn throughout the night and during any daytime naps. People with certain medical conditions may also need to wear the mask at other times when they are awake. Follow instructions from your health care provider about when to use the machine. What are some tips for using the mask?   Because the mask needs to be snug, some people feel trapped or closed-in (claustrophobic) when first using the mask. If you feel this way, you may need to get used to the mask. One way to do this is by holding the mask loosely over  your nose or mouth and then gradually applying the mask more snugly. You can also gradually increase the amount of time that you use the mask.  Masks are available in various types and sizes. Some fit over your mouth and nose while others fit over just your nose. If your mask does not fit well, talk with your health care provider about getting a different one.  If you are using a mask that fits over your nose and you tend to breathe through your mouth, a chin strap may be applied to help keep your mouth closed.  The CPAP and BPAP machines have alarms that may sound if the mask comes off or develops a leak.  If you have trouble with the mask, it is very important that you talk with  your health care provider about finding a way to make the mask easier to tolerate. Do not stop using the mask. Stopping the use of the mask could have a negative impact on your health. What are some tips for using the machine?  Place your CPAP or BPAP machine on a secure table or stand near an electrical outlet.  Know where the on/off switch is located on the machine.  Follow instructions from your health care provider about how to set the pressure on your machine and when you should use it.  Do not eat or drink while the CPAP or BPAP machine is on. Food or fluids could get pushed into your lungs by the pressure of the CPAP or BPAP.  Do not smoke. Tobacco smoke residue can damage the machine.  For home use, CPAP and BPAP machines can be rented or purchased through home health care companies. Many different brands of machines are available. Renting a machine before purchasing may help you find out which particular machine works well for you.  Keep the CPAP or BPAP machine and attachments clean. Ask your health care provider for specific instructions. Get help right away if:  You have redness or open areas around your nose or mouth where the mask fits.  You have trouble using the CPAP or BPAP machine.  You cannot tolerate wearing the CPAP or BPAP mask.  You have pain, discomfort, and bloating in your abdomen. Summary  CPAP and BPAP are methods of helping a person breathe with the use of air pressure.  Both CPAP and BPAP are provided by a small machine with a flexible plastic tube that attaches to a plastic mask.  If you have trouble with the mask, it is very important that you talk with your health care provider about finding a way to make the mask easier to tolerate. This information is not intended to replace advice given to you by your health care provider. Make sure you discuss any questions you have with your health care provider. Document Revised: 07/18/2018 Document Reviewed:  02/15/2016 Elsevier Patient Education  [place].

## 2019-10-25 NOTE — Progress Notes (Signed)
@Patient  ID: Kelly Hale, female    DOB: [birth year], 64 y.o.   MRN: [id number]  Chief Complaint  Patient presents with  . Follow-up    wearing cpap avg 9hr nightly- feels pressure could be a bit stronger-mask is okay.  c/o sob with exertion, prod cough with clear mucus and wheezing.     Referring provider: Harlan Stains, MD  HPI: 64 year old female, former smoker quit in 2017 (30-pack-year history). Past medical history significant for obstructive sleep apnea, moderate COPD. Former patient of Dr. Alva Garnet, last seen for televisit in June 2020. PSG in 2016 showed an AHI of 64/hr- CPAP at 16 cm H2O. Maintained on Breo and as needed albuterol. O2 dependent.  10/25/2019 Patient presents today for annual follow-up for OSA/COPD. She is compliant with CPAP use and reports benefit from use. States that she sleeps significantly better when wearing mask. Reports sleeping well through the night. She is taking a nap in the afternoon. She has noticed more wheezing and cough. She recently had house painted in doors. This may have affected her breathing somewhat. She is complaint with Breo Ellipta 200 and uses albuterol twice a month. She is not currently taking anything over the counter. She uses OTC nasal spray. [facility] does not have POC. She uses 2.5-3L continuous oxygen. Continues to work on weight loss. Exercises with stationary bike/rowing machines three times a week. Current weight today was 292 lbs.   Airview 09/23/19- 10/22/19: 30/30 days (100%) Usage 8 hours 16min  Pressure 16cm h20 Airleaks 1.2 (95%) AHI 0.4  DATA: PFTs 10/01/13: FVC:  2.19 L ( 60 %pred), FEV1:  1.50 L ( 53 %pred), FEV1/FVC: 69%, TLC:  4.94 L ( 92 %pred), DLCO  99 %pred PSG 04/11/14: AHI 64/hr. CPAP recommendation 16 cm H2O Compliance 03/26 - 08/02/16: 30/30 days, 30 days > 4 hrs.  Compliance 04/19 - 08/26/17: 30/30 days, 30 days > 4 hrs.  6MWT 09/27/17: 108 meters. Desaturated to 86% on RA, 88% on 2 LPM CPAP compliance  10/21-11/19/19: 30/30 nights. CPAP 16 cm H2O, Mean AHI 0.3   Allergies  Allergen Reactions  . Aspirin Other (See Comments)    REACTION: intol-asthma Patient takes low dose of aspirin.    Immunization History  Administered Date(s) Administered  . Influenza Split 04/20/2015  . Influenza,inj,Quad PF,6+ Mos 12/27/2013, 02/12/2016, 03/02/2018  . PFIZER SARS-COV-2 Vaccination 07/05/2019, 07/26/2019  . Pneumococcal Conjugate-13 05/05/2009  . Pneumococcal-Unspecified 01/09/2013  . Tdap 11/08/2005    Past Medical History:  Diagnosis Date  . Aspirin allergy   . Asthma   . CHF (congestive heart failure) ([place])   . COPD (chronic obstructive pulmonary disease) (H[facility])    oxygen at home as needed (& at night)  . Hyperlipidemia   . Morbid obesity ([place])   . Tobacco abuse     Tobacco History: Social History   Tobacco Use  Smoking Status Former Smoker  . Packs/day: 0.00  . Years: 30.00  . Pack years: 0.00  . Types: Cigarettes  . Quit date: 07/16/2015  . Years since quitting: 4.2  Smokeless Tobacco Never Used  Tobacco Comment   quit in Sept 2015, but started back smoking up to 2 cigs per day   Counseling given: Not Answered Comment: quit in Sept 2015, but started back smoking up to 2 cigs per day   Outpatient Medications Prior to Visit  Medication Sig Dispense Refill  . albuterol (PROVENTIL HFA;VENTOLIN HFA) 108 (90 Base) MCG/ACT inhaler Inhale 1-2 puffs into the lungs every 6 (  six) hours as needed for wheezing or shortness of breath. 3 Inhaler 0  . albuterol (PROVENTIL) (2.5 MG/3ML) 0.083% nebulizer solution Take 3 mLs (2.5 mg total) by nebulization every 6 (six) hours as needed for wheezing or shortness of breath. 100 vial 6  . amLODipine (NORVASC) 5 MG tablet Take 1 tablet (5 mg total) by mouth daily. 90 tablet 3  . aspirin 81 MG tablet Take 1 tablet (81 mg total) by mouth daily. 30 tablet 0  . fluticasone furoate-vilanterol (BREO ELLIPTA) 200-25 MCG/INH AEPB Inhale 1 puff into  the lungs daily. 180 each 1  . furosemide (LASIX) 20 MG tablet Take 1 tablet (20 mg total) as directed by mouth. 1 tablet daily and extra dose in PM as needed for swelling 180 tablet 3  . ipratropium-albuterol (DUONEB) 0.5-2.5 (3) MG/3ML SOLN Take 3 mLs by nebulization 2 (two) times daily. 20 mL 0  . Multiple Vitamin (MULTIVITAMIN) capsule Take 1 capsule by mouth daily.    . OXYGEN Inhale 3 L into the lungs daily. 2 liters daily    . rosuvastatin (CRESTOR) 10 MG tablet Take 1 tablet (10 mg total) by mouth daily. 90 tablet 3  . Vitamin D, Cholecalciferol, 1000 UNITS CAPS Take 1,000 Units by mouth daily.      No facility-administered medications prior to visit.      Review of Systems  Review of Systems  Constitutional: Negative.   HENT: Positive for congestion and nosebleeds.   Respiratory: Positive for cough and chest tightness. Negative for apnea, choking, shortness of breath, wheezing and stridor.   Cardiovascular: Negative.      Physical Exam  BP 136/78 (BP Location: Left Arm, Cuff Size: Large)   Pulse 100   Temp 98.6 F (37 C) (Temporal)   Ht 5' 6.5" (1.689 m)   Wt 292 lb 6.4 oz (132.6 kg)   SpO2 93%   BMI 46.49 kg/m  Physical Exam Constitutional:      General: She is not in acute distress.    Appearance: Normal appearance. She is obese. She is not ill-appearing.  HENT:     Head: Normocephalic and atraumatic.     Nose: No congestion or rhinorrhea.     Mouth/Throat:     Mouth: Mucous membranes are moist.     Pharynx: Oropharynx is clear.  Cardiovascular:     Rate and Rhythm: Normal rate and regular rhythm.  Pulmonary:     Effort: Pulmonary effort is normal.     Breath sounds: Normal breath sounds.  Musculoskeletal:     Cervical back: Normal range of motion and neck supple.  Skin:    General: Skin is warm and dry.  Neurological:     General: No focal deficit present.     Mental Status: She is alert and oriented to person, place, and time. Mental status is at  baseline.  Psychiatric:        Mood and Affect: Mood normal.        Behavior: Behavior normal.        Thought Content: Thought content normal.        Judgment: Judgment normal.      Lab Results:  CBC    Component Value Date/Time   WBC 11.5 (H) 07/07/2016 1722   RBC 4.79 07/07/2016 1722   HGB 14.3 07/07/2016 1722   HGB 16.2 (H) 12/24/2013 0508   HCT 42.4 07/07/2016 1722   HCT 53.4 (H) 12/24/2013 0508   PLT 337 07/07/2016 1722   PLT 266  12/24/2013 0508   MCV 88.4 07/07/2016 1722   MCV 93 12/24/2013 0508   MCH 29.7 07/07/2016 1722   MCHC 33.7 07/07/2016 1722   RDW 15.5 (H) 07/07/2016 1722   RDW 17.3 (H) 12/24/2013 0508   LYMPHSABS 0.6 (L) 12/24/2013 0508   MONOABS 0.3 12/24/2013 0508   EOSABS 0.0 12/24/2013 0508   BASOSABS 0.0 12/24/2013 0508    BMET    Component Value Date/Time   NA 141 07/07/2016 1722   NA 142 12/24/2013 0508   K 3.5 07/07/2016 1722   K 4.2 12/24/2013 0508   CL 101 07/07/2016 1722   CL 98 12/24/2013 0508   CO2 32 07/07/2016 1722   CO2 37 (H) 12/24/2013 0508   GLUCOSE 143 (H) 07/07/2016 1722   GLUCOSE 118 (H) 12/24/2013 0508   BUN 15 07/07/2016 1722   BUN 14 12/24/2013 0508   CREATININE 0.75 07/07/2016 1722   CREATININE 0.69 12/24/2013 0508   CALCIUM 9.2 07/07/2016 1722   CALCIUM 8.5 12/24/2013 0508   GFRNONAA >60 07/07/2016 1722   GFRNONAA >60 12/24/2013 0508   GFRAA >60 07/07/2016 1722   GFRAA >60 12/24/2013 0508    BNP    Component Value Date/Time   BNP 10.0 07/07/2016 1722    ProBNP    Component Value Date/Time   PROBNP 870.50 (H) 12/19/2013 0954    Imaging: No results found.   Assessment & Plan:   OSA on CPAP - PSG in 2016 showed an AHI of 64/h - Patient is 100% compliant with CPAP and reports benefit from use - Pressure 16 cmH2O; AHI 0.4 - No changes today - Continue CPAP every night for 4-6 hours or more    COPD with emphysema (HCC) - Moderate obstructive lung disease/ FEV1 1.49 (58%), ratio 62, no significant  BD - Mostly stable interval, some increased wheezing and cough due to indoor house painting  - Continue Breo Ellipta 200 one puff daily (rinse mouth after use); consider adding LAMA or changing to LABA/LAMA in the future - Take delsym for cough as needed twice daily - FU in 1 year with Dr. Mortimer Fries (new patient)      Martyn Ehrich, NP 10/29/2019

## 2019-10-27 DIAGNOSIS — Z1211 Encounter for screening for malignant neoplasm of colon: Secondary | ICD-10-CM | POA: Diagnosis not present

## 2019-10-29 ENCOUNTER — Encounter: Payer: Self-pay | Admitting: Primary Care

## 2019-10-29 NOTE — Assessment & Plan Note (Addendum)
-   Moderate obstructive lung disease/ FEV1 1.49 (58%), ratio 62, no significant BD - Mostly stable interval, some increased wheezing and cough due to indoor house painting  - Continue Breo Ellipta 200 one puff daily (rinse mouth after use); consider adding LAMA or changing to LABA/LAMA in the future - Take delsym for cough as needed twice daily - FU in 1 year with Dr. Mortimer Fries (new patient)

## 2019-10-29 NOTE — Assessment & Plan Note (Addendum)
-   PSG in 2016 showed an AHI of 64/h - Patient is 100% compliant with CPAP and reports benefit from use - Pressure 16 cmH2O; AHI 0.4 - No changes today - Continue CPAP every night for 4-6 hours or more

## 2019-11-06 DIAGNOSIS — I10 Essential (primary) hypertension: Secondary | ICD-10-CM | POA: Diagnosis not present

## 2019-11-06 DIAGNOSIS — R195 Other fecal abnormalities: Secondary | ICD-10-CM | POA: Diagnosis not present

## 2019-11-06 DIAGNOSIS — J9611 Chronic respiratory failure with hypoxia: Secondary | ICD-10-CM | POA: Diagnosis not present

## 2019-11-06 DIAGNOSIS — E785 Hyperlipidemia, unspecified: Secondary | ICD-10-CM | POA: Diagnosis not present

## 2019-11-06 DIAGNOSIS — I5032 Chronic diastolic (congestive) heart failure: Secondary | ICD-10-CM | POA: Diagnosis not present

## 2019-11-06 DIAGNOSIS — J449 Chronic obstructive pulmonary disease, unspecified: Secondary | ICD-10-CM | POA: Diagnosis not present

## 2019-12-25 DIAGNOSIS — R195 Other fecal abnormalities: Secondary | ICD-10-CM | POA: Diagnosis not present

## 2019-12-25 DIAGNOSIS — Z86010 Personal history of colonic polyps: Secondary | ICD-10-CM | POA: Diagnosis not present

## 2019-12-26 ENCOUNTER — Telehealth: Payer: Self-pay | Admitting: Internal Medicine

## 2019-12-26 NOTE — Telephone Encounter (Signed)
Routed to front desk staff for patient to be called and scheduled as a new patient as she was last seen in [place] by Dr. Alva Garnet who is no longer with the practice.

## 2019-12-26 NOTE — Telephone Encounter (Signed)
Kelly Hale -triage I signed a note from Dr. Watt Climes of [facility].  He wants a preop pulmonary evaluation with a colonoscopy.  In reviewing the chart I have never seen this patient.  Patient seen in [place] by Dr. Jamal Collin who is no longer in the practice  Plan -Patient can be seen either in [facility] at her convenience by any pulmonologist for preop evaluation first available    [place] [address]

## 2019-12-27 NOTE — Telephone Encounter (Signed)
Looks like the patient was seen by [facility] in the [place] office on 10/25/2019 - will Beth complete the surgery clearance. Beth put in a recall to follow up in one year and see Dr. Claudette Laws

## 2019-12-27 NOTE — Telephone Encounter (Signed)
Kelly Hale is out of the office until next Thursday 9/23. How soon does the surgical clearance form need to be completed and has it even been faxed to the office yet?

## 2019-12-30 NOTE — Telephone Encounter (Signed)
Kelly Hale is currently out of the office until Thursday 9/23. Will go ahead and route this to her for her to review once she returns to the office.

## 2019-12-30 NOTE — Telephone Encounter (Signed)
I don't actually have a surgery clearance form- message sent back to triage due to a message being sent to the front desk pool to schedule a f/u for surgery clearance.-pr

## 2020-01-02 NOTE — Telephone Encounter (Signed)
See above

## 2020-01-02 NOTE — Telephone Encounter (Signed)
Can I do a televisit with her on 9/28 for clearance

## 2020-01-02 NOTE — Telephone Encounter (Signed)
Attempted to call pt but unable to reach. Left message for her to return call. 

## 2020-01-02 NOTE — Telephone Encounter (Signed)
Called and spoke with patient, offered her to see Geraldo Pitter NP o n9/28 in [place], she lives in [place] and wants an appointment in [place].  She said it does not have to be I the next couple of weeks, she needs surgical clearance for a colonoscopy.  She last saw Dr. Alva Garnet in October of 2020.  Made her an appointment to see Dr. Patsey Berthold in [place] on 10/14 at 11:30 am in a 30 minute new patient slot.  Nothing further needed.

## 2020-01-13 DIAGNOSIS — J449 Chronic obstructive pulmonary disease, unspecified: Secondary | ICD-10-CM | POA: Diagnosis not present

## 2020-01-13 DIAGNOSIS — J441 Chronic obstructive pulmonary disease with (acute) exacerbation: Secondary | ICD-10-CM | POA: Diagnosis not present

## 2020-01-13 DIAGNOSIS — J4530 Mild persistent asthma, uncomplicated: Secondary | ICD-10-CM | POA: Diagnosis not present

## 2020-01-13 DIAGNOSIS — I10 Essential (primary) hypertension: Secondary | ICD-10-CM | POA: Diagnosis not present

## 2020-01-13 DIAGNOSIS — I5032 Chronic diastolic (congestive) heart failure: Secondary | ICD-10-CM | POA: Diagnosis not present

## 2020-01-13 DIAGNOSIS — E785 Hyperlipidemia, unspecified: Secondary | ICD-10-CM | POA: Diagnosis not present

## 2020-01-20 ENCOUNTER — Telehealth: Payer: Self-pay | Admitting: Pulmonary Disease

## 2020-01-21 NOTE — Telephone Encounter (Signed)
Called and spoke to patient, who is requesting refill on albuterol neb solution. This medication was last prescribed by Dr. Stevenson Clinch in 2016.  Patient has upcoming appt with Dr. Patsey Berthold.   Dr. Patsey Berthold please advise if okay to refill. Thanks

## 2020-01-21 NOTE — Telephone Encounter (Signed)
It appears this patient is a sleep patient with COPD that apparently has been stable.  I have never seen the patient previously.  She is supposed to be scheduled with Dr. Mortimer Fries.  Do not know if someone else has been filling albuterol for her.

## 2020-01-21 NOTE — Telephone Encounter (Signed)
Please see 12/26/2019 phone note. It appears that patient is scheduled for surgical clearance with Dr. Patsey Berthold on 01/23/2020. Beth approved for phone visit on 12/26/2019, however patient was scheduled with Dr. Patsey Berthold instead. Per Dr. Patsey Berthold verbally, she recommends that [facility] does surgical clearance, as she has seen patient previously.  Patient has been scheduled for phone visit on 01/23/2020 with Beth at 10:30.  Routing to [employer] as an Pharmacist, hospital.

## 2020-01-21 NOTE — Progress Notes (Signed)
Virtual Visit via Telephone Note  I connected with Kelly Hale on 01/23/20 at 10:30 AM EDT by telephone and verified that I am speaking with the correct person using two identifiers.  Location: Patient: Home Provider: [facility] - [address], [place], [place], [place] [address]   I discussed the limitations, risks, security and privacy concerns of performing an evaluation and management service by telephone and the availability of in person appointments. I also discussed with the patient that there may be a patient responsible charge related to this service. The patient expressed understanding and agreed to proceed.  Patient consented to consult via telephone: Yes People present and their role in pt care: Pt   History of Present Illness:  64 year old female former smoker followed in our office for obstructive sleep apnea  Past medical history: Obesity, hypertension Smoking history: Former smoker Maintenance: Breo 200  Patient of: Dr. Mortimer Hale  Chief complaint:   64 year old female former smoker followed in our office for obstructive sleep apnea.  Patient is established in our [place] office.  She was previously followed by Dr. Alva Hale.  She is planning on establishing care with Kelly Hale over the next year  Patient is also hoping to complete a colonoscopy over the coming months.  She reports this has not yet been scheduled.  She reports that her primary care provider had concerns about whether or not she could tolerate a colonoscopy needed to have pulmonary and cardiology clearance.  From a pulmonary standpoint patient continues to be maintained on Brio Ellipta 200.  Unfortunately she is having worsened acute symptoms today.  She is having increased congestion, nasal drainage, shortness of breath.  She is having to use her albuterol nebulizers more often about every 8 hours over the last 2 weeks.  She has not run out of albuterol nebulized meds.  She is requesting a refill  of this today.  She denies any recent fevers or loss of appetite.  She is vaccinated for COVID-19.  She continues to be maintained on oxygen 3 L 24/7.  CPAP use/compliance report shows excellent compliance.  See compliance were listed below:  12/22/2019-01/20/2020-CPAP compliance report-20 at a last 30 days use, 28 of those days greater than 4 hours, average usage 8 hours and 29 minutes, CPAP set pressure of 16, AHI 0.2  Observations/Objective:  Social History   Tobacco Use  Smoking Status Former Smoker  . Packs/day: 0.00  . Years: 30.00  . Pack years: 0.00  . Types: Cigarettes  . Quit date: 07/16/2015  . Years since quitting: 4.5  Smokeless Tobacco Never Used  Tobacco Comment   quit in Sept 2015, but started back smoking up to 2 cigs per day   Immunization History  Administered Date(s) Administered  . Influenza Split 04/20/2015  . Influenza,inj,Quad PF,6+ Mos 12/27/2013, 02/12/2016, 03/02/2018  . PFIZER SARS-COV-2 Vaccination 07/05/2019, 07/26/2019  . Pneumococcal Conjugate-13 05/05/2009  . Pneumococcal-Unspecified 01/09/2013  . Tdap 11/08/2005      Assessment and Plan:  COPD with emphysema ([place]) 2 weeks of increased shortness of breath Suspect this may be asthmatic bronchitis No fevers No discolored mucus Increase nasal drainage  Plan: Prednisone taper today We will schedule closer follow-up in person in the [place] office to establish with Kelly Hale Continue Breo Ellipta 200 We will refill albuterol nebulized meds Patient knows to contact her office sooner if symptoms not improving Start nasal saline rinses  OSA on CPAP Plan: Continue CPAP therapy  Surgical counseling visit Peri-operative Assessment of  Pulmonary Risk for Non-Thoracic Surgery:  [name], moderate to high risk of perioperative pulmonary complications is increased by:  Age greater than 93 years  COPD  Chronic Resp Failure - O2 Dependent  Former Smoker  Obstructive sleep  apnea    Respiratory complications generally occur in 1% of ASA Class I patients, 5% of ASA Class II and 10% of ASA Class III-IV patients These complications rarely result in mortality and iclude postoperative pneumonia, atelectasis, pulmonary embolism, ARDS and increased time requiring postoperative mechanical ventilation.  Overall, I recommend proceeding with the surgery if the risk for respiratory complications are outweighed by the potential benefits. This will need to be discussed between the patient and surgeon.  To reduce risks of respiratory complications, I recommend: --Pre- and post-operative incentive spirometry performed frequently while awake --Inpatient use of currently prescribed positive-pressure for OSA whenever the patient is sleeping --Avoiding use of pancuronium during anesthesia.  I have discussed the risk factors and recommendations above with the patient.   Allergic rhinitis Plan: Start nasal saline rinses   Follow Up Instructions:  Return in about 2 months (around 03/24/2020), or if symptoms worsen or fail to improve, for [facility] - Dr. Mortimer Hale, [place].   I discussed the assessment and treatment plan with the patient. The patient was provided an opportunity to ask questions and all were answered. The patient agreed with the plan and demonstrated an understanding of the instructions.   The patient was advised to call back or seek an in-person evaluation if the symptoms worsen or if the condition fails to improve as anticipated.  I provided 23 minutes of non-face-to-face time during this encounter.   Lauraine Rinne, NP

## 2020-01-23 ENCOUNTER — Other Ambulatory Visit: Payer: Self-pay

## 2020-01-23 ENCOUNTER — Ambulatory Visit: Payer: Medicare HMO | Admitting: Pulmonary Disease

## 2020-01-23 ENCOUNTER — Encounter: Payer: Self-pay | Admitting: Pulmonary Disease

## 2020-01-23 ENCOUNTER — Ambulatory Visit (INDEPENDENT_AMBULATORY_CARE_PROVIDER_SITE_OTHER): Payer: Medicare HMO | Admitting: Pulmonary Disease

## 2020-01-23 DIAGNOSIS — J432 Centrilobular emphysema: Secondary | ICD-10-CM | POA: Diagnosis not present

## 2020-01-23 DIAGNOSIS — Z9989 Dependence on other enabling machines and devices: Secondary | ICD-10-CM

## 2020-01-23 DIAGNOSIS — J309 Allergic rhinitis, unspecified: Secondary | ICD-10-CM | POA: Diagnosis not present

## 2020-01-23 DIAGNOSIS — Z7189 Other specified counseling: Secondary | ICD-10-CM

## 2020-01-23 DIAGNOSIS — G4733 Obstructive sleep apnea (adult) (pediatric): Secondary | ICD-10-CM | POA: Diagnosis not present

## 2020-01-23 MED ORDER — PREDNISONE 10 MG PO TABS: ORAL_TABLET | ORAL | 0 refills | Status: DC

## 2020-01-23 MED ORDER — ALBUTEROL SULFATE (2.5 MG/3ML) 0.083% IN NEBU
2.5000 mg | INHALATION_SOLUTION | Freq: Four times a day (QID) | RESPIRATORY_TRACT | 6 refills | Status: AC | PRN
Start: 2020-01-23 — End: ?

## 2020-01-23 NOTE — Patient Instructions (Addendum)
You were seen today by Lauraine Rinne, NP  for:   1. Centrilobular emphysema (HCC)  - albuterol (PROVENTIL) (2.5 MG/3ML) 0.083% nebulizer solution; Take 3 mLs (2.5 mg total) by nebulization every 6 (six) hours as needed for wheezing or shortness of breath.  Dispense: 300 mL; Refill: 6 - predniSONE (DELTASONE) 10 MG tablet; 4 tabs for 2 days, then 3 tabs for 2 days, 2 tabs for 2 days, then 1 tab for 2 days, then stop  Dispense: 20 tablet; Refill: 0  Prednisone 10mg  tablet  >>>4 tabs for 2 days, then 3 tabs for 2 days, 2 tabs for 2 days, then 1 tab for 2 days, then stop >>>take with food  >>>take in the morning   Breo Ellipta 200 >>> Take 1 puff daily in the morning right when you wake up >>>Rinse your mouth out after use >>>This is a daily maintenance inhaler, NOT a rescue inhaler >>>Contact our office if you are having difficulties affording or obtaining this medication >>>It is important for you to be able to take this daily and not miss any doses      2. OSA on CPAP  We recommend that you continue using your CPAP daily >>>Keep up the hard work using your device >>> Goal should be wearing this for the entire night that you are sleeping, at least 4 to 6 hours  Remember:  . Do not drive or operate heavy machinery if tired or drowsy.  . Please notify the supply company and office if you are unable to use your device regularly due to missing supplies or machine being broken.  . Work on maintaining a healthy weight and following your recommended nutrition plan  . Maintain proper daily exercise and movement  . Maintaining proper use of your device can also help improve management of other chronic illnesses such as: Blood pressure, blood sugars, and weight management.   BiPAP/ CPAP Cleaning:  >>>Clean weekly, with Dawn soap, and bottle brush.  Set up to air dry. >>> Wipe mask out daily with wet wipe or towelette   3. Allergic rhinitis, unspecified seasonality, unspecified  trigger  Start nasal saline rinses twice daily Use distilled water Shake well Get bottle lukewarm like a baby bottle  4. Surgical counseling visit  Peri-operative Assessment of Pulmonary Risk for Non-Thoracic Surgery:  [name], moderate to high risk of perioperative pulmonary complications is increased by:  Age greater than 24 years  COPD  Chronic Resp Failure - O2 Dependent  Former Smoker  Obstructive sleep apnea    Respiratory complications generally occur in 1% of ASA Class I patients, 5% of ASA Class II and 10% of ASA Class III-IV patients These complications rarely result in mortality and iclude postoperative pneumonia, atelectasis, pulmonary embolism, ARDS and increased time requiring postoperative mechanical ventilation.  Overall, I recommend proceeding with the surgery if the risk for respiratory complications are outweighed by the potential benefits. This will need to be discussed between the patient and surgeon.  To reduce risks of respiratory complications, I recommend: --Pre- and post-operative incentive spirometry performed frequently while awake --Inpatient use of currently prescribed positive-pressure for OSA whenever the patient is sleeping --Avoiding use of pancuronium during anesthesia.  I have discussed the risk factors and recommendations above with the patient.     We recommend today:  No orders of the defined types were placed in this encounter.  No orders of the defined types were placed in this encounter.  Meds ordered this encounter  Medications  .  albuterol (PROVENTIL) (2.5 MG/3ML) 0.083% nebulizer solution    Sig: Take 3 mLs (2.5 mg total) by nebulization every 6 (six) hours as needed for wheezing or shortness of breath.    Dispense:  300 mL    Refill:  6  . predniSONE (DELTASONE) 10 MG tablet    Sig: 4 tabs for 2 days, then 3 tabs for 2 days, 2 tabs for 2 days, then 1 tab for 2 days, then stop    Dispense:  20 tablet    Refill:  0     Follow Up:    Return in about 6 weeks (around 03/05/2020), or if symptoms worsen or fail to improve, for [facility] - Dr. Mortimer Fries, [place].   Notification of test results are managed in the following manner: If there are  any recommendations or changes to the  plan of care discussed in office today,  we will contact you and let you know what they are. If you do not hear from [place], then your results are normal and you can view them through your  MyChart account , or a letter will be sent to you. Thank you again for trusting [place] with your care  - Thank you, [facility]    It is flu season:   >>> Best ways to protect herself from the flu: Receive the yearly flu vaccine, practice good hand hygiene washing with soap and also using hand sanitizer when available, eat a nutritious meals, get adequate rest, hydrate appropriately       Please contact the office if your symptoms worsen or you have concerns that you are not improving.   Thank you for choosing [facility] for your healthcare, and for allowing [place] to partner with you on your healthcare journey. I am thankful to be able to provide care to you today.   Wyn Quaker FNP-C

## 2020-01-23 NOTE — Assessment & Plan Note (Signed)
2 weeks of increased shortness of breath Suspect this may be asthmatic bronchitis No fevers No discolored mucus Increase nasal drainage  Plan: Prednisone taper today We will schedule closer follow-up in person in the [place] office to establish with DK Continue Breo Ellipta 200 We will refill albuterol nebulized meds Patient knows to contact her office sooner if symptoms not improving Start nasal saline rinses

## 2020-01-23 NOTE — Assessment & Plan Note (Signed)
Peri-operative Assessment of Pulmonary Risk for Non-Thoracic Surgery:  ForMs. [name], moderate to high risk of perioperative pulmonary complications is increased by:  Age greater than 44 years  COPD  Chronic Resp Failure - O2 Dependent  Former Smoker  Obstructive sleep apnea    Respiratory complications generally occur in 1% of ASA Class I patients, 5% of ASA Class II and 10% of ASA Class III-IV patients These complications rarely result in mortality and iclude postoperative pneumonia, atelectasis, pulmonary embolism, ARDS and increased time requiring postoperative mechanical ventilation.  Overall, I recommend proceeding with the surgery if the risk for respiratory complications are outweighed by the potential benefits. This will need to be discussed between the patient and surgeon.  To reduce risks of respiratory complications, I recommend: --Pre- and post-operative incentive spirometry performed frequently while awake --Inpatient use of currently prescribed positive-pressure for OSA whenever the patient is sleeping --Avoiding use of pancuronium during anesthesia.  I have discussed the risk factors and recommendations above with the patient.

## 2020-01-23 NOTE — Assessment & Plan Note (Signed)
Plan: Continue CPAP therapy 

## 2020-01-23 NOTE — Assessment & Plan Note (Signed)
Plan: Start nasal saline rinses

## 2020-02-06 DIAGNOSIS — Z1231 Encounter for screening mammogram for malignant neoplasm of breast: Secondary | ICD-10-CM | POA: Diagnosis not present

## 2020-03-10 DIAGNOSIS — H2513 Age-related nuclear cataract, bilateral: Secondary | ICD-10-CM | POA: Diagnosis not present

## 2020-03-19 ENCOUNTER — Ambulatory Visit: Payer: Medicare HMO | Admitting: Internal Medicine

## 2020-04-06 DIAGNOSIS — J45998 Other asthma: Secondary | ICD-10-CM | POA: Diagnosis not present

## 2020-04-06 DIAGNOSIS — J449 Chronic obstructive pulmonary disease, unspecified: Secondary | ICD-10-CM | POA: Diagnosis not present

## 2020-04-06 DIAGNOSIS — J441 Chronic obstructive pulmonary disease with (acute) exacerbation: Secondary | ICD-10-CM | POA: Diagnosis not present

## 2020-04-06 DIAGNOSIS — J439 Emphysema, unspecified: Secondary | ICD-10-CM | POA: Diagnosis not present

## 2020-04-29 ENCOUNTER — Ambulatory Visit: Payer: Medicare HMO | Admitting: Internal Medicine

## 2020-05-04 ENCOUNTER — Other Ambulatory Visit (HOSPITAL_COMMUNITY)
Admission: RE | Admit: 2020-05-04 | Discharge: 2020-05-04 | Disposition: A | Discharge status: Home / Self Care | Payer: Medicare HMO | Source: Ambulatory Visit | Attending: Family Medicine | Admitting: Family Medicine

## 2020-05-04 ENCOUNTER — Other Ambulatory Visit: Payer: Self-pay | Admitting: Family Medicine

## 2020-05-04 DIAGNOSIS — R0789 Other chest pain: Secondary | ICD-10-CM | POA: Diagnosis not present

## 2020-05-04 DIAGNOSIS — J449 Chronic obstructive pulmonary disease, unspecified: Secondary | ICD-10-CM | POA: Diagnosis not present

## 2020-05-04 DIAGNOSIS — J4530 Mild persistent asthma, uncomplicated: Secondary | ICD-10-CM | POA: Diagnosis not present

## 2020-05-04 DIAGNOSIS — Z124 Encounter for screening for malignant neoplasm of cervix: Secondary | ICD-10-CM | POA: Insufficient documentation

## 2020-05-04 DIAGNOSIS — Z1151 Encounter for screening for human papillomavirus (HPV): Secondary | ICD-10-CM | POA: Insufficient documentation

## 2020-05-04 DIAGNOSIS — R195 Other fecal abnormalities: Secondary | ICD-10-CM | POA: Diagnosis not present

## 2020-05-04 DIAGNOSIS — E785 Hyperlipidemia, unspecified: Secondary | ICD-10-CM | POA: Diagnosis not present

## 2020-05-04 DIAGNOSIS — J9611 Chronic respiratory failure with hypoxia: Secondary | ICD-10-CM | POA: Diagnosis not present

## 2020-05-04 DIAGNOSIS — E559 Vitamin D deficiency, unspecified: Secondary | ICD-10-CM | POA: Diagnosis not present

## 2020-05-04 DIAGNOSIS — I5032 Chronic diastolic (congestive) heart failure: Secondary | ICD-10-CM | POA: Diagnosis not present

## 2020-05-04 DIAGNOSIS — Z0000 Encounter for general adult medical examination without abnormal findings: Secondary | ICD-10-CM | POA: Diagnosis not present

## 2020-05-04 DIAGNOSIS — I10 Essential (primary) hypertension: Secondary | ICD-10-CM | POA: Diagnosis not present

## 2020-05-06 LAB — CYTOLOGY - PAP
Comment: NEGATIVE
Diagnosis: NEGATIVE
High risk HPV: NEGATIVE

## 2020-05-12 ENCOUNTER — Ambulatory Visit: Payer: Medicare HMO | Admitting: Cardiovascular Disease

## 2020-05-12 ENCOUNTER — Encounter: Payer: Self-pay | Admitting: Cardiovascular Disease

## 2020-05-12 ENCOUNTER — Other Ambulatory Visit: Payer: Self-pay

## 2020-05-12 VITALS — BP 140/90 | HR 101 | Ht 66.0 in | Wt 289.2 lb

## 2020-05-12 DIAGNOSIS — J432 Centrilobular emphysema: Secondary | ICD-10-CM

## 2020-05-12 DIAGNOSIS — F17210 Nicotine dependence, cigarettes, uncomplicated: Secondary | ICD-10-CM | POA: Diagnosis not present

## 2020-05-12 DIAGNOSIS — Z9989 Dependence on other enabling machines and devices: Secondary | ICD-10-CM

## 2020-05-12 DIAGNOSIS — G4733 Obstructive sleep apnea (adult) (pediatric): Secondary | ICD-10-CM | POA: Diagnosis not present

## 2020-05-12 DIAGNOSIS — I10 Essential (primary) hypertension: Secondary | ICD-10-CM | POA: Diagnosis not present

## 2020-05-12 NOTE — Patient Instructions (Addendum)
Labs from PMD   Call if you get worsening chest pain, Testing could be done (stress test/cathetization, CT scan)   Medication Instructions:  No changes  If you need a refill on your cardiac medications before your next appointment, please call your pharmacy.    Lab work: No new labs needed   If you have labs (blood work) drawn today and your tests are completely normal, you will receive your results only by: Marland Kitchen MyChart Message (if you have MyChart) OR . A paper copy in the mail If you have any lab test that is abnormal or we need to change your treatment, we will call you to review the results.   Testing/Procedures: No new testing needed   Follow-Up: At [facility], you and your health needs are our priority.  As part of our continuing mission to provide you with exceptional heart care, we have created designated Provider Care Teams.  These Care Teams include your primary Cardiologist (physician) and Advanced Practice Providers (APPs -  Physician Assistants and Nurse Practitioners) who all work together to provide you with the care you need, when you need it.  . You will need a follow up appointment one year  . Providers on your designated Care Team:   . Murray Hodgkins, NP . Christell Faith, PA-C . Marrianne Mood, PA-C  Any Other Special Instructions Will Be Listed Below (If Applicable).  COVID-19 Vaccine Information can be found at: ShippingScam.co.uk For questions related to vaccine distribution or appointments, please email vaccine@[email or web address] .com or call [phone].

## 2020-05-12 NOTE — Progress Notes (Signed)
Cardiology Office Note  Date:  05/12/2020   ID:  Kelly Hale, DOB 07-03-1955, MRN [id number]  PCP:  Harlan Stains, MD   Chief Complaint  Patient presents with  . Other    Chest tightness. Meds reviewed verbally with pt.    HPI:   Kelly Hale is a 65 year old woman with  morbid obesity,  long smoking history, Quit 07/2015 severe COPD, hypertension,  obstructive sleep apnea on CPAP,  chronic diastolic CHF,  previous hospitalization 12/23/2013 at [facility] for acute respiratory failure with acute on chronic diastolic CHF.  She received antibiotics, prednisone taper, aggressive diuresis with improvement of her symptoms. She presents for routine follow-up of her chronic diastolic CHF  Reports having chronic shortness of breath Not very active at baseline, wears chronic oxygen Slow weight loss through dietary changes, fasting in the afternoon  Reports having mild discomfort on the left, underneath the left breast area Happens at rest, sometimes with exertion Reports that dates back quite some time, no escalation, happens sparingly Does not have discomfort on exercise which he does at home Feels much of her shortness of breath is secondary to deconditioning and her weight  Reports that she stopped smoking  On inhalers Lasix daily  EKG personally reviewed by myself on todays visit Shows sinus tachycardia rate 101 bpm no significant ST or T wave changes  Other past medical history Echocardiogram results reviewed from 12/23/2013 showing normal ejection fraction, diastolic dysfunction, normal right heart size and function, normal right ventricular systolic pressure  PMH:   has a past medical history of Aspirin allergy, Asthma, CHF (congestive heart failure) ([place]), COPD (chronic obstructive pulmonary disease) ([place]), Hyperlipidemia, Morbid obesity ([place]), and Tobacco abuse.  PSH:    Past Surgical History:  Procedure Laterality Date  . CESAREAN SECTION    . KNEE ARTHROSCOPY     right   . TUBAL LIGATION      Current Outpatient Medications  Medication Sig Dispense Refill  . albuterol (PROVENTIL HFA;VENTOLIN HFA) 108 (90 Base) MCG/ACT inhaler Inhale 1-2 puffs into the lungs every 6 (six) hours as needed for wheezing or shortness of breath. 3 Inhaler 0  . albuterol (PROVENTIL) (2.5 MG/3ML) 0.083% nebulizer solution Take 3 mLs (2.5 mg total) by nebulization every 6 (six) hours as needed for wheezing or shortness of breath. 300 mL 6  . amLODipine (NORVASC) 5 MG tablet Take 1 tablet (5 mg total) by mouth daily. 90 tablet 3  . aspirin 81 MG tablet Take 1 tablet (81 mg total) by mouth daily. 30 tablet 0  . fluticasone furoate-vilanterol (BREO ELLIPTA) 200-25 MCG/INH AEPB Inhale 1 puff into the lungs daily. 180 each 1  . furosemide (LASIX) 20 MG tablet Take 1 tablet (20 mg total) as directed by mouth. 1 tablet daily and extra dose in PM as needed for swelling 180 tablet 3  . Multiple Vitamin (MULTIVITAMIN) capsule Take 1 capsule by mouth daily.    . OXYGEN Inhale 3 L into the lungs daily. 2 liters daily    . rosuvastatin (CRESTOR) 10 MG tablet Take 1 tablet (10 mg total) by mouth daily. 90 tablet 3   No current facility-administered medications for this visit.     Allergies:   Aspirin, Mometasone furo-formoterol fum, Singulair [montelukast], and Tiotropium bromide monohydrate   Social History:  The patient  reports that she quit smoking about 4 years ago. Her smoking use included cigarettes. She smoked 0.00 packs per day for 30.00 years. She has never used smokeless tobacco. She reports  that she does not drink alcohol and does not use drugs.   Family History:   family history includes Alzheimer's disease in her maternal grandmother; Asthma in her mother; Diabetes in her paternal grandfather; Hyperlipidemia in her maternal grandfather and mother; Hypertension in her maternal grandfather, maternal grandmother, and mother; Other in her father, maternal uncle, and mother; Stroke in her  maternal grandfather.    Review of Systems: Review of Systems  Constitutional: Negative.   Respiratory: Positive for shortness of breath.   Cardiovascular: Negative.   Gastrointestinal: Negative.   Musculoskeletal: Negative.   Neurological: Negative.   Psychiatric/Behavioral: Negative.   All other systems reviewed and are negative.    PHYSICAL EXAM: VS:  BP 140/90 (BP Location: Left Arm, Patient Position: Sitting, Cuff Size: Large)   Pulse (!) 101   Ht 5\' 6"  (1.676 m)   Wt 289 lb 4 oz (131.2 kg)   SpO2 98%   BMI 46.69 kg/m  , BMI Body mass index is 46.69 kg/m. Constitutional:  oriented to person, place, and time. No distress.  On oxygen, obese HENT:  Head: Grossly normal Eyes:  no discharge. No scleral icterus.  Neck: No JVD, no carotid bruits  Cardiovascular: Regular rate and rhythm, no murmurs appreciated Pulmonary/Chest: Clear to auscultation bilaterally, no wheezes or rails Abdominal: Soft.  no distension.  no tenderness.  Musculoskeletal: Normal range of motion Neurological:  normal muscle tone. Coordination normal. No atrophy Skin: Skin warm and dry Psychiatric: normal affect, pleasant  Recent Labs: No results found for requested labs within last 8760 hours.    Lipid Panel Lab Results  Component Value Date   CHOL 128 12/24/2013   HDL 50 12/24/2013   LDLCALC 65 12/24/2013   TRIG 65 12/24/2013      Wt Readings from Last 3 Encounters:  05/12/20 289 lb 4 oz (131.2 kg)  10/25/19 292 lb 6.4 oz (132.6 kg)  03/02/18 (!) 306 lb (138.8 kg)      ASSESSMENT AND PLAN:  SOB (shortness of breath) - Plan: EKG 12-Lead Shortness of breath likely multifactorial Biggest contributor is her weight, COPD Appears euvolemic, Lasix daily  Chest pain Atypical in nature Discussed various treatment options including stress testing, cardiac CTA She prefers no testing at this time as she feels symptoms are very low-grade, very rare, not typically associated with exertion  which would be more concerning for angina -Discussed anginal symptoms to watch for, recommended she call us if symptoms become more frequent, more intense  Centrilobular emphysema ([place]) Feels her symptoms are stable on oxygen  TOBACCO USER Stop smoking April 2017 Residual disease COPD  Morbid obesity ([place]) We have encouraged continued exercise, careful diet management in an effort to lose weight.  OSA on CPAP - Plan: EKG 12-Lead CPAP at night  Chronic diastolic CHF (congestive heart failure) ([place]) - Plan: EKG 12-Lead Continue Lasix daily Recommending continued weight loss   Total encounter time more than 25 minutes  Greater than 50% was spent in counseling and coordination of care with the patient    Orders Placed This Encounter  Procedures  . EKG 12-Lead     Signed, Esmond Plants, M.D., Ph.D. 05/12/2020  [place], [place]

## 2020-05-13 DIAGNOSIS — I10 Essential (primary) hypertension: Secondary | ICD-10-CM | POA: Diagnosis not present

## 2020-05-13 DIAGNOSIS — E785 Hyperlipidemia, unspecified: Secondary | ICD-10-CM | POA: Diagnosis not present

## 2020-05-13 DIAGNOSIS — G8929 Other chronic pain: Secondary | ICD-10-CM | POA: Diagnosis not present

## 2020-05-13 DIAGNOSIS — I5032 Chronic diastolic (congestive) heart failure: Secondary | ICD-10-CM | POA: Diagnosis not present

## 2020-05-13 DIAGNOSIS — J449 Chronic obstructive pulmonary disease, unspecified: Secondary | ICD-10-CM | POA: Diagnosis not present

## 2020-05-13 DIAGNOSIS — J4530 Mild persistent asthma, uncomplicated: Secondary | ICD-10-CM | POA: Diagnosis not present

## 2020-05-13 DIAGNOSIS — J441 Chronic obstructive pulmonary disease with (acute) exacerbation: Secondary | ICD-10-CM | POA: Diagnosis not present

## 2020-05-18 ENCOUNTER — Telehealth: Payer: Self-pay

## 2020-05-18 NOTE — Telephone Encounter (Signed)
   Primary Cardiologist: Ida Rogue, MD  Chart reviewed as part of pre-operative protocol coverage. Patient was contacted 05/18/2020 in reference to pre-operative risk assessment for pending surgery as outlined below.  Kelly Hale was last seen on 05/12/20 by Dr. [name].  Since that day, Kelly Hale has done well. She is primarily limited by her COPD and chronic respiratory failure. She does not have a history of MI or stroke. She does not have exertional chest pain. No hx of PCI, OK to hold ASA for 5-7 days prior to colonoscopy. Recommend continuation of lasix.  Therefore, based on ACC/AHA guidelines, the patient would be at acceptable risk for the planned procedure without further cardiovascular testing.   The patient was advised that if she develops new symptoms prior to surgery to contact our office to arrange for a follow-up visit, and she verbalized understanding.  I will route this recommendation to the requesting party via Epic fax function and remove from pre-op pool. Please call with questions.  Ledora Bottcher, PA 05/18/2020, 12:32 PM

## 2020-05-18 NOTE — Telephone Encounter (Signed)
   [facility] Pre-operative Risk Assessment    HEARTCARE STAFF: - Please ensure there is not already an duplicate clearance open for this procedure. - Under Visit Info/Reason for Call, type in Other and utilize the format Clearance MM/DD/YY or Clearance TBD. Do not use dashes or single digits. - If request is for dental extraction, please clarify the # of teeth to be extracted.  Request for surgical clearance:  1. What type of surgery is being performed? Colonoscopy   2. When is this surgery scheduled? TBD   3. What type of clearance is required (medical clearance vs. Pharmacy clearance to hold med vs. Both)? Both  4. Are there any medications that need to be held prior to surgery and how long?Aspirin, Lasix   Practice name and name of physician performing surgery? [facility] , Dr.Magod   5. What is the office phone number? [phone]   7.   What is the office fax number? [phone]  8.   Anesthesia type (None, local, MAC, general) ? Propofol   Kelly Hale 05/18/2020, 11:32 AM  _________________________________________________________________   (provider comments below)

## 2020-05-26 ENCOUNTER — Ambulatory Visit: Payer: Medicare HMO | Admitting: Cardiovascular Disease

## 2020-06-02 ENCOUNTER — Encounter: Payer: Self-pay | Admitting: Internal Medicine

## 2020-06-02 ENCOUNTER — Other Ambulatory Visit: Payer: Self-pay

## 2020-06-02 ENCOUNTER — Ambulatory Visit: Payer: Medicare HMO | Admitting: Internal Medicine

## 2020-06-02 VITALS — BP 126/78 | HR 100 | Temp 98.2°F | Ht 66.0 in | Wt 289.0 lb

## 2020-06-02 DIAGNOSIS — J449 Chronic obstructive pulmonary disease, unspecified: Secondary | ICD-10-CM | POA: Diagnosis not present

## 2020-06-02 DIAGNOSIS — G4733 Obstructive sleep apnea (adult) (pediatric): Secondary | ICD-10-CM

## 2020-06-02 DIAGNOSIS — Z87891 Personal history of nicotine dependence: Secondary | ICD-10-CM | POA: Diagnosis not present

## 2020-06-02 DIAGNOSIS — J9611 Chronic respiratory failure with hypoxia: Secondary | ICD-10-CM | POA: Diagnosis not present

## 2020-06-02 NOTE — Progress Notes (Addendum)
@Patient  ID: Kelly Hale, female    DOB: 30-Jan-1956, 65 y.o.   MRN: [id number]   Referring provider: Harlan Stains, MD      SYNOPSIS 65 year old female, former smoker quit in 2017 (30-pack-year history). Past medical history significant for obstructive sleep apnea, moderate COPD. Former patient of Dr. Alva Garnet, last seen for televisit in June 2020. PSG in 2016 showed an AHI of 64/hr- CPAP at 16 cm H2O. Maintained on Breo and as needed albuterol. O2 dependent.    Airview 09/23/19- 10/22/19: 30/30 days (100%) Usage 8 hours 53min  Pressure 16cm h20 Airleaks 1.2 (95%) AHI 0.4  DATA: PFTs 10/01/13: FVC:  2.19 L ( 60 %pred), FEV1:  1.50 L ( 53 %pred), FEV1/FVC: 69%, TLC:  4.94 L ( 92 %pred), DLCO  99 %pred PSG 04/11/14: AHI 64/hr. CPAP recommendation 16 cm H2O Compliance 03/26 - 08/02/16: 30/30 days, 30 days > 4 hrs.  Compliance 04/19 - 08/26/17: 30/30 days, 30 days > 4 hrs.  6MWT 09/27/17: 108 meters. Desaturated to 86% on RA, 88% on 2 LPM CPAP compliance 10/21-11/19/19: 30/30 nights. CPAP 16 cm H2O, Mean AHI 0.3     CC  follow up OSA Follow up COPD   HPI Follow-up OSA Patient excellent compliance report 100% for days and greater than 4 hours Full facemask No leaks CPAP 16 cm water pressure AHI reduced to 0.2 Patient uses and benefits from therapy  COPD seems to be stable at this time There is some increased intermittent wheezing Advised to use albuterol more frequently if needed We will not add any additional inhaler therapy at this time  Chronic hypoxic respiratory failure Continue oxygen as prescribed Patient uses and benefits from therapy  No exacerbation at this time No evidence of heart failure at this time No evidence or signs of infection at this time No respiratory distress No fevers, chills, nausea, vomiting, diarrhea No evidence of lower extremity edema No evidence hemoptysis         Allergies  Allergen Reactions  . Aspirin Other  (See Comments)    REACTION: intol-asthma Patient takes low dose of aspirin.  . Mometasone Furo-Formoterol Fum     Other reaction(s): Unknown  . Singulair [Montelukast]     Other reaction(s): Unknown  . Tiotropium Bromide Monohydrate     Other reaction(s): Unknown    Immunization History  Administered Date(s) Administered  . Influenza Split 04/20/2015  . Influenza,inj,Quad PF,6+ Mos 12/27/2013, 02/12/2016, 03/02/2018  . PFIZER(Purple Top)SARS-COV-2 Vaccination 07/05/2019, 07/26/2019  . Pneumococcal Conjugate-13 05/05/2009  . Pneumococcal-Unspecified 01/09/2013  . Tdap 11/08/2005    Past Medical History:  Diagnosis Date  . Aspirin allergy   . Asthma   . CHF (congestive heart failure) ([place])   . COPD (chronic obstructive pulmonary disease) (HCC)    oxygen at home as needed (& at night)  . Hyperlipidemia   . Morbid obesity ([place])   . Tobacco abuse     Tobacco History: Social History   Tobacco Use  Smoking Status Former Smoker  . Packs/day: 0.00  . Years: 30.00  . Pack years: 0.00  . Types: Cigarettes  . Quit date: 07/16/2015  . Years since quitting: 4.8  Smokeless Tobacco Never Used  Tobacco Comment   quit in Sept 2015, but started back smoking up to 2 cigs per day   Counseling given: Not Answered Comment: quit in Sept 2015, but started back smoking up to 2 cigs per day   Outpatient Medications Prior to Visit  Medication Sig Dispense  Refill  . albuterol (PROVENTIL HFA;VENTOLIN HFA) 108 (90 Base) MCG/ACT inhaler Inhale 1-2 puffs into the lungs every 6 (six) hours as needed for wheezing or shortness of breath. 3 Inhaler 0  . albuterol (PROVENTIL) (2.5 MG/3ML) 0.083% nebulizer solution Take 3 mLs (2.5 mg total) by nebulization every 6 (six) hours as needed for wheezing or shortness of breath. 300 mL 6  . amLODipine (NORVASC) 5 MG tablet Take 1 tablet (5 mg total) by mouth daily. 90 tablet 3  . aspirin 81 MG tablet Take 1 tablet (81 mg total) by mouth daily. 30 tablet 0   . fluticasone furoate-vilanterol (BREO ELLIPTA) 200-25 MCG/INH AEPB Inhale 1 puff into the lungs daily. 180 each 1  . furosemide (LASIX) 20 MG tablet Take 1 tablet (20 mg total) as directed by mouth. 1 tablet daily and extra dose in PM as needed for swelling 180 tablet 3  . Multiple Vitamin (MULTIVITAMIN) capsule Take 1 capsule by mouth daily.    . OXYGEN Inhale 3 L into the lungs daily. 2 liters daily    . rosuvastatin (CRESTOR) 10 MG tablet Take 1 tablet (10 mg total) by mouth daily. 90 tablet 3   No facility-administered medications prior to visit.       Review of Systems:  Gen:  Denies  fever, sweats, chills weight loss  HEENT: Denies blurred vision, double vision, ear pain, eye pain, hearing loss, nose bleeds, sore throat Cardiac:  No dizziness, chest pain or heaviness, chest tightness,edema, No JVD Resp:   No cough, -sputum production, +shortness of breath,+wheezing, -hemoptysis,  Gi: Denies swallowing difficulty, stomach pain, nausea or vomiting, diarrhea, constipation, bowel incontinence Gu:  Denies bladder incontinence, burning urine Ext:   Denies Joint pain, stiffness or swelling Skin: Denies  skin rash, easy bruising or bleeding or hives Endoc:  Denies polyuria, polydipsia , polyphagia or weight change Psych:   Denies depression, insomnia or hallucinations  Other:  All other systems negative   BP 126/78 (BP Location: Left Arm, Cuff Size: Normal)   Pulse 100   Temp 98.2 F (36.8 C) (Temporal)   Ht 5\' 6"  (1.676 m)   Wt 289 lb (131.1 kg)   SpO2 95%   BMI 46.65 kg/m   Physical Examination:   General Appearance: No distress  Neuro:without focal findings,  speech normal,  HEENT: PERRLA, EOM intact.   Pulmonary: normal breath sounds, + wheezing.  CardiovascularNormal S1,S2.  No m/r/g.   Abdomen: Benign, Soft, non-tender. Renal:  No costovertebral tenderness  GU:  Not performed at this time. Endoc: No evident thyromegaly Skin:   warm, no rashes, no ecchymosis   Extremities: normal, no cyanosis, clubbing. PSYCHIATRIC: Mood, affect within normal limits.   ALL OTHER ROS ARE NEGATIVE   ASSESSMENT AND PLAN  65 year old white female with morbid obesity with underlying severe sleep apnea in the setting of moderate COPD with emphysema with chronic hypoxic respiratory failure poor respiratory insufficiency and very deconditioned state  COPD with emphysema Moderate obstructive lung disease with FEV1 58% predicted Continue Breo 200 as prescribed Increased ALBUTEROL AS NEEDED  OSA on CPAP PSG 2016    AHI 64 - PSG in 2016 showed an AHI of 64/h - Patient is 100% compliant with CPAP and reports benefit from use - Pressure 16 cmH2O; AHI 0.4 - No changes today - Continue CPAP every night for 4-6 hours or more   LUNG CANCER SCREENING PROTOCOL   Preop pulmonary assessment for colonoscopy Patient is a moderate risk for pulmonary complications At  this time patient is optimized medically for her obstructive sleep apnea and COPD  Obesity -recommend significant weight loss -recommend changing diet  Deconditioned state -Recommend increased daily activity and exercise       COVID-19 EDUCATION: The signs and symptoms of COVID-19 were discussed with the patient. Importance of hand Hygiene and Social Distancing   MEDICATION ADJUSTMENTS/LABS AND TESTS ORDERED: CONTINUE CPAP as prescribed  Continue inhalers as prescribed Increase Albuterol as needed Continue Oxygen as prescribed AVOID second hand smoke! Lung cancer screening protocol CURRENT MEDICATIONS REVIEWED AT LENGTH WITH PATIENT TODAY   Patient/Family are satisfied with Plan of action and management. All questions answered  Follow up 1 year   Total time spent 33 mins  Corrin Parker, M.D.  [facility]  Medical Director [place] [facility] Cardio-Pulmonary Department

## 2020-06-02 NOTE — Patient Instructions (Signed)
CONTINUE CPAP as prescribed A+!!!!!!! Great JOB!!!  Continue inhalers as prescribed Increase Albuterol as needed Continue Oxygen as prescribed  AVOID second hand smoke!   Lung cancer screening protocol

## 2020-06-10 ENCOUNTER — Telehealth: Payer: Self-pay | Admitting: *Deleted

## 2020-06-10 NOTE — Telephone Encounter (Signed)
Received referral for low dose lung cancer screening CT scan. Message left at phone number listed in EMR for patient to call me back to facilitate scheduling scan.  

## 2020-06-22 ENCOUNTER — Telehealth: Payer: Self-pay | Admitting: *Deleted

## 2020-06-22 DIAGNOSIS — Z87891 Personal history of nicotine dependence: Secondary | ICD-10-CM

## 2020-06-22 DIAGNOSIS — Z122 Encounter for screening for malignant neoplasm of respiratory organs: Secondary | ICD-10-CM

## 2020-06-22 NOTE — Telephone Encounter (Signed)
Received referral for initial lung cancer screening scan. Contacted patient and obtained smoking history,(former, quit 2017, 30 pack year) as well as answering questions related to screening process. Patient denies signs of lung cancer such as weight loss or hemoptysis. Patient denies comorbidity that would prevent curative treatment if lung cancer were found. Patient is scheduled for shared decision making visit and CT scan on 07/01/20 at 1015am.

## 2020-06-25 DIAGNOSIS — G8929 Other chronic pain: Secondary | ICD-10-CM | POA: Diagnosis not present

## 2020-06-25 DIAGNOSIS — J449 Chronic obstructive pulmonary disease, unspecified: Secondary | ICD-10-CM | POA: Diagnosis not present

## 2020-06-25 DIAGNOSIS — I10 Essential (primary) hypertension: Secondary | ICD-10-CM | POA: Diagnosis not present

## 2020-06-25 DIAGNOSIS — I5032 Chronic diastolic (congestive) heart failure: Secondary | ICD-10-CM | POA: Diagnosis not present

## 2020-06-25 DIAGNOSIS — E785 Hyperlipidemia, unspecified: Secondary | ICD-10-CM | POA: Diagnosis not present

## 2020-06-25 DIAGNOSIS — J4530 Mild persistent asthma, uncomplicated: Secondary | ICD-10-CM | POA: Diagnosis not present

## 2020-06-25 DIAGNOSIS — J441 Chronic obstructive pulmonary disease with (acute) exacerbation: Secondary | ICD-10-CM | POA: Diagnosis not present

## 2020-07-01 ENCOUNTER — Inpatient Hospital Stay: Payer: Medicare HMO | Attending: Oncology | Admitting: Hospice and Palliative Medicine

## 2020-07-01 ENCOUNTER — Other Ambulatory Visit: Payer: Self-pay

## 2020-07-01 ENCOUNTER — Ambulatory Visit
Admission: RE | Admit: 2020-07-01 | Discharge: 2020-07-01 | Disposition: A | Discharge status: Home / Self Care | Payer: Medicare HMO | Source: Ambulatory Visit | Attending: Oncology | Admitting: Oncology

## 2020-07-01 DIAGNOSIS — Z87891 Personal history of nicotine dependence: Secondary | ICD-10-CM | POA: Insufficient documentation

## 2020-07-01 DIAGNOSIS — Z122 Encounter for screening for malignant neoplasm of respiratory organs: Secondary | ICD-10-CM | POA: Insufficient documentation

## 2020-07-01 NOTE — Progress Notes (Signed)
Virtual Visit via Video Note  I connected with@ on 07/01/20 at@ by a video enabled telemedicine application and verified that I am speaking with the correct person using two identifiers.   I discussed the limitations of evaluation and management by telemedicine and the availability of in person appointments. The patient expressed understanding and agreed to proceed.  In accordance with CMS guidelines, patient has met eligibility criteria including age, absence of signs or symptoms of lung cancer.  Social History   Tobacco Use  . Smoking status: Former Smoker    Packs/day: 0.75    Years: 40.00    Pack years: 30.00    Types: Cigarettes    Quit date: 07/16/2015    Years since quitting: 4.9  . Smokeless tobacco: Never Used  . Tobacco comment: quit in Sept 2015, but started back smoking up to 2 cigs per day  Vaping Use  . Vaping Use: Never used  Substance Use Topics  . Alcohol use: No    Alcohol/week: 0.0 standard drinks  . Drug use: No      A shared decision-making session was conducted prior to the performance of CT scan. This includes one or more decision aids, includes benefits and harms of screening, follow-up diagnostic testing, over-diagnosis, false positive rate, and total radiation exposure.   Counseling on the importance of adherence to annual lung cancer LDCT screening, impact of co-morbidities, and ability or willingness to undergo diagnosis and treatment is imperative for compliance of the program.   Counseling on the importance of continued smoking cessation for former smokers; the importance of smoking cessation for current smokers, and information about tobacco cessation interventions have been given to patient including [place] and 1800 quit [place] programs.   Written order for lung cancer screening with LDCT has been given to the patient and any and all questions have been answered to the best of my abilities.    Yearly follow up will be coordinated by Burgess Estelle, Thoracic Navigator.  Time Total: 15 minutes  Visit consisted of counseling and education dealing with complex health screening. Greater than 50%  of this time was spent counseling and coordinating care related to the above assessment and plan.  Signed by: Altha Harm, PhD, NP-C

## 2020-07-07 ENCOUNTER — Encounter: Payer: Self-pay | Admitting: *Deleted

## 2020-08-01 DIAGNOSIS — J449 Chronic obstructive pulmonary disease, unspecified: Secondary | ICD-10-CM | POA: Diagnosis not present

## 2020-08-01 DIAGNOSIS — J439 Emphysema, unspecified: Secondary | ICD-10-CM | POA: Diagnosis not present

## 2020-08-01 DIAGNOSIS — J441 Chronic obstructive pulmonary disease with (acute) exacerbation: Secondary | ICD-10-CM | POA: Diagnosis not present

## 2020-08-01 DIAGNOSIS — R062 Wheezing: Secondary | ICD-10-CM | POA: Diagnosis not present

## 2020-08-01 DIAGNOSIS — J45998 Other asthma: Secondary | ICD-10-CM | POA: Diagnosis not present

## 2020-08-01 DIAGNOSIS — I5040 Unspecified combined systolic (congestive) and diastolic (congestive) heart failure: Secondary | ICD-10-CM | POA: Diagnosis not present

## 2020-08-17 DIAGNOSIS — J441 Chronic obstructive pulmonary disease with (acute) exacerbation: Secondary | ICD-10-CM | POA: Diagnosis not present

## 2020-08-17 DIAGNOSIS — I10 Essential (primary) hypertension: Secondary | ICD-10-CM | POA: Diagnosis not present

## 2020-08-17 DIAGNOSIS — I5032 Chronic diastolic (congestive) heart failure: Secondary | ICD-10-CM | POA: Diagnosis not present

## 2020-08-17 DIAGNOSIS — E785 Hyperlipidemia, unspecified: Secondary | ICD-10-CM | POA: Diagnosis not present

## 2020-08-17 DIAGNOSIS — J449 Chronic obstructive pulmonary disease, unspecified: Secondary | ICD-10-CM | POA: Diagnosis not present

## 2020-08-17 DIAGNOSIS — G8929 Other chronic pain: Secondary | ICD-10-CM | POA: Diagnosis not present

## 2020-08-17 DIAGNOSIS — J4530 Mild persistent asthma, uncomplicated: Secondary | ICD-10-CM | POA: Diagnosis not present

## 2020-08-20 DIAGNOSIS — R195 Other fecal abnormalities: Secondary | ICD-10-CM | POA: Diagnosis not present

## 2020-08-20 DIAGNOSIS — Z9989 Dependence on other enabling machines and devices: Secondary | ICD-10-CM | POA: Diagnosis not present

## 2020-08-20 DIAGNOSIS — Z9981 Dependence on supplemental oxygen: Secondary | ICD-10-CM | POA: Diagnosis not present

## 2020-08-20 DIAGNOSIS — Z86010 Personal history of colonic polyps: Secondary | ICD-10-CM | POA: Diagnosis not present

## 2020-08-20 DIAGNOSIS — G4733 Obstructive sleep apnea (adult) (pediatric): Secondary | ICD-10-CM | POA: Diagnosis not present

## 2020-08-20 DIAGNOSIS — I5032 Chronic diastolic (congestive) heart failure: Secondary | ICD-10-CM | POA: Diagnosis not present

## 2020-08-21 ENCOUNTER — Telehealth: Payer: Self-pay | Admitting: Cardiovascular Disease

## 2020-08-21 NOTE — Telephone Encounter (Signed)
   Name: Kelly Hale  DOB: 06/19/1955  MRN: [id number]   Primary Cardiologist: Kelly Rogue, MD  Chart reviewed as part of pre-operative protocol coverage. Patient was contacted 08/21/2020 in reference to pre-operative risk assessment for pending surgery as outlined below.  Kelly Hale was last seen on 05/18/20 by Dr. Rockey Hale.  CT for lung cancer screening 07/01/20 notes both coronary artery calcification and aortic atherosclerosis.  No hx of PCI, okay to hold Aspirin 5-7 days prior to planned procedure per previous recommendations.  Called Kelly Hale to assess for any cardiac symptoms. No answer on home phone with no option to leave VM and secondary home phone rings busy. We will continue to attempt to reach her.   Loel Dubonnet, NP 08/21/2020, 12:13 PM

## 2020-08-21 NOTE — Telephone Encounter (Signed)
   [facility] Pre-operative Risk Assessment    Patient Name: Kelly Hale  DOB: [birth year]  MRN: [id number]   HEARTCARE STAFF: - Please ensure there is not already an duplicate clearance open for this procedure. - Under Visit Info/Reason for Call, type in Other and utilize the format Clearance MM/DD/YY or Clearance TBD. Do not use dashes or single digits. - If request is for dental extraction, please clarify the # of teeth to be extracted.  Request for surgical clearance:  1. What type of surgery is being performed? Colonoscopy   2. When is this surgery scheduled? 11/04/20  3. What type of clearance is required (medical clearance vs. Pharmacy clearance to hold med vs. Both)? both  4. Are there any medications that need to be held prior to surgery and how long? Not listed, please advise if needed  5. Practice name and name of physician performing surgery? [facility] - Dr Madolyn Frieze   6. What is the office phone number? [phone]   7.   What is the office fax number? [phone]  8.   Anesthesia type (None, local, MAC, general) ? Says with anesthesia but does not specify type   Kelly Hale 08/21/2020, 11:16 AM  _________________________________________________________________   (provider comments below)

## 2020-08-25 NOTE — Telephone Encounter (Signed)
   Primary Cardiologist: Ida Rogue, MD  Chart reviewed as part of pre-operative protocol coverage.   65 y.o. female with  COPD  (HFpEF) heart failure with preserved ejection fraction   OSA  RCRI:  Perioperative Risk of Major Cardiac Event is (%): 0.9 (low risk) DASI:  Functional Capacity in METs is: 4.4 (functional status is fair )  Patient was contacted 08/25/2020 in reference to pre-operative risk assessment for pending surgery as outlined below.    Since last seen, NESREEN ALBANO has done well without chest pain.  She has chronic shortness of breath related to COPD.  She has not had any symptoms concerning for decompensated CHF.  Recommendations: . Therefore, based on ACC/AHA guidelines, the patient is at acceptable risk for the planned procedure without further cardiovascular testing.  . If needed, the patient can hold ASA for 7 days prior to surgery and resume post op as soon as it is felt to be safe from a bleeding perspective.    Please call with questions. Richardson Dopp, PA-C 08/25/2020, 9:14 AM

## 2020-08-25 NOTE — Telephone Encounter (Signed)
Notes faxed to surgeon. This phone note will be removed from the preop pool. Richardson Dopp, PA-C  08/25/2020 9:19 AM

## 2020-10-22 DIAGNOSIS — J4530 Mild persistent asthma, uncomplicated: Secondary | ICD-10-CM | POA: Diagnosis not present

## 2020-10-22 DIAGNOSIS — I10 Essential (primary) hypertension: Secondary | ICD-10-CM | POA: Diagnosis not present

## 2020-10-22 DIAGNOSIS — J441 Chronic obstructive pulmonary disease with (acute) exacerbation: Secondary | ICD-10-CM | POA: Diagnosis not present

## 2020-10-22 DIAGNOSIS — J449 Chronic obstructive pulmonary disease, unspecified: Secondary | ICD-10-CM | POA: Diagnosis not present

## 2020-10-22 DIAGNOSIS — I5032 Chronic diastolic (congestive) heart failure: Secondary | ICD-10-CM | POA: Diagnosis not present

## 2020-10-22 DIAGNOSIS — E785 Hyperlipidemia, unspecified: Secondary | ICD-10-CM | POA: Diagnosis not present

## 2020-10-22 DIAGNOSIS — G8929 Other chronic pain: Secondary | ICD-10-CM | POA: Diagnosis not present

## 2020-11-03 ENCOUNTER — Encounter: Payer: Self-pay | Admitting: General Surgery

## 2020-11-04 ENCOUNTER — Ambulatory Visit: Payer: Medicare HMO | Admitting: Anesthesiology

## 2020-11-04 ENCOUNTER — Ambulatory Visit
Admission: RE | Admit: 2020-11-04 | Discharge: 2020-11-04 | Disposition: A | Discharge status: Home / Self Care | Payer: Medicare HMO | Attending: General Surgery | Admitting: General Surgery

## 2020-11-04 ENCOUNTER — Encounter
Admission: RE | Disposition: A | Discharge status: Home / Self Care | Payer: Self-pay | Source: Home / Self Care | Attending: General Surgery

## 2020-11-04 DIAGNOSIS — R195 Other fecal abnormalities: Secondary | ICD-10-CM | POA: Diagnosis not present

## 2020-11-04 DIAGNOSIS — Z86010 Personal history of colonic polyps: Secondary | ICD-10-CM | POA: Diagnosis not present

## 2020-11-04 DIAGNOSIS — Z886 Allergy status to analgesic agent status: Secondary | ICD-10-CM | POA: Diagnosis not present

## 2020-11-04 DIAGNOSIS — Z888 Allergy status to other drugs, medicaments and biological substances status: Secondary | ICD-10-CM | POA: Insufficient documentation

## 2020-11-04 DIAGNOSIS — I509 Heart failure, unspecified: Secondary | ICD-10-CM | POA: Insufficient documentation

## 2020-11-04 DIAGNOSIS — Z79899 Other long term (current) drug therapy: Secondary | ICD-10-CM | POA: Insufficient documentation

## 2020-11-04 DIAGNOSIS — J449 Chronic obstructive pulmonary disease, unspecified: Secondary | ICD-10-CM | POA: Diagnosis not present

## 2020-11-04 DIAGNOSIS — Z7951 Long term (current) use of inhaled steroids: Secondary | ICD-10-CM | POA: Insufficient documentation

## 2020-11-04 DIAGNOSIS — K649 Unspecified hemorrhoids: Secondary | ICD-10-CM | POA: Diagnosis not present

## 2020-11-04 DIAGNOSIS — D120 Benign neoplasm of cecum: Secondary | ICD-10-CM | POA: Insufficient documentation

## 2020-11-04 DIAGNOSIS — D128 Benign neoplasm of rectum: Secondary | ICD-10-CM | POA: Insufficient documentation

## 2020-11-04 DIAGNOSIS — Z7982 Long term (current) use of aspirin: Secondary | ICD-10-CM | POA: Insufficient documentation

## 2020-11-04 DIAGNOSIS — Z8719 Personal history of other diseases of the digestive system: Secondary | ICD-10-CM | POA: Diagnosis not present

## 2020-11-04 DIAGNOSIS — Z1211 Encounter for screening for malignant neoplasm of colon: Secondary | ICD-10-CM | POA: Diagnosis not present

## 2020-11-04 DIAGNOSIS — D123 Benign neoplasm of transverse colon: Secondary | ICD-10-CM | POA: Diagnosis not present

## 2020-11-04 DIAGNOSIS — K635 Polyp of colon: Secondary | ICD-10-CM | POA: Diagnosis not present

## 2020-11-04 DIAGNOSIS — K5730 Diverticulosis of large intestine without perforation or abscess without bleeding: Secondary | ICD-10-CM | POA: Diagnosis not present

## 2020-11-04 DIAGNOSIS — I110 Hypertensive heart disease with heart failure: Secondary | ICD-10-CM | POA: Insufficient documentation

## 2020-11-04 HISTORY — DX: Angina pectoris, unspecified: I20.9

## 2020-11-04 HISTORY — PX: COLONOSCOPY WITH PROPOFOL: SHX5780

## 2020-11-04 HISTORY — DX: Dyspnea, unspecified: R06.00

## 2020-11-04 SURGERY — COLONOSCOPY WITH PROPOFOL
Anesthesia: General

## 2020-11-04 MED ORDER — PROPOFOL 500 MG/50ML IV EMUL
INTRAVENOUS | Status: AC
  Filled 2020-11-04: qty 50

## 2020-11-04 MED ORDER — LIDOCAINE HCL (CARDIAC) PF 100 MG/5ML IV SOSY
PREFILLED_SYRINGE | INTRAVENOUS | Status: DC | PRN
  Administered 2020-11-04: 40 mg via INTRAVENOUS

## 2020-11-04 MED ORDER — PROPOFOL 500 MG/50ML IV EMUL
INTRAVENOUS | Status: DC | PRN
  Administered 2020-11-04: 125 ug/kg/min via INTRAVENOUS

## 2020-11-04 MED ORDER — SODIUM CHLORIDE 0.9 % IV SOLN
INTRAVENOUS | Status: DC
  Administered 2020-11-04: 20 mL/h via INTRAVENOUS

## 2020-11-04 MED ORDER — PROPOFOL 10 MG/ML IV BOLUS
INTRAVENOUS | Status: DC | PRN
Start: 2020-11-04 — End: 2020-11-04
  Administered 2020-11-04: 10 mg via INTRAVENOUS
  Administered 2020-11-04: 80 mg via INTRAVENOUS

## 2020-11-04 NOTE — Anesthesia Postprocedure Evaluation (Signed)
Anesthesia Post Note  Patient: Kelly Hale  Procedure(s) Performed: COLONOSCOPY WITH PROPOFOL  Patient location during evaluation: Endoscopy Anesthesia Type: General Level of consciousness: awake and alert Pain management: pain level controlled Vital Signs Assessment: post-procedure vital signs reviewed and stable Respiratory status: spontaneous breathing, nonlabored ventilation, respiratory function stable and patient connected to nasal cannula oxygen Cardiovascular status: blood pressure returned to baseline and stable Postop Assessment: no apparent nausea or vomiting Anesthetic complications: no   No notable events documented.   Last Vitals:  Vitals:   11/04/20 0939 11/04/20 0949  BP: (!) 139/99 (!) 147/74  Pulse:    Resp:  16  Temp:    SpO2:  99%    Last Pain:  Vitals:   11/04/20 0939  TempSrc:   PainSc: 0-No pain                 Margaree Mackintosh

## 2020-11-04 NOTE — Anesthesia Procedure Notes (Signed)
Date/Time: 11/04/2020 8:42 AM Performed by: Doreen Salvage, CRNA Pre-anesthesia Checklist: Patient identified, Emergency Drugs available, Suction available and Patient being monitored Patient Re-evaluated:Patient Re-evaluated prior to induction Oxygen Delivery Method: Supernova nasal CPAP Induction Type: IV induction Dental Injury: Teeth and Oropharynx as per pre-operative assessment  Comments: Nasal cannula with etCO2 monitoring

## 2020-11-04 NOTE — Op Note (Addendum)
[facility] Patient Name: Kelly Hale Procedure Date: 11/04/2020 8:33 AM MRN: [id number] Account #: [id number] Date of Birth: [birth year] Admit Type: Outpatient Age: 65 Room: [facility] ENDO ROOM 1 Gender: Female Note Status: Finalized Procedure:             Colonoscopy Indications:           High risk colon cancer surveillance: Personal history                         of colonic polyps, Incidental - Positive fecal                         immunochemical test Providers:             Robert Bellow, MD Referring MD:          Harlan Stains (Referring MD) Medicines:             Propofol per Anesthesia Complications:         No immediate complications. Procedure:             Pre-Anesthesia Assessment:                        - Prior to the procedure, a History and Physical was                         performed, and patient medications, allergies and                         sensitivities were reviewed. The patient's tolerance                         of previous anesthesia was reviewed.                        - The risks and benefits of the procedure and the                         sedation options and risks were discussed with the                         patient. All questions were answered and informed                         consent was obtained.                        After obtaining informed consent, the colonoscope was                         passed under direct vision. Throughout the procedure,                         the patient's blood pressure, pulse, and oxygen                         saturations were monitored continuously. The                         Colonoscope was introduced through the anus and  advanced to the the terminal ileum. The colonoscopy                         was performed without difficulty. The patient                         tolerated the procedure well. The quality of the bowel                          preparation was excellent. Findings:      A 5 mm polyp was found in the cecum. The polyp was sessile. The polyp       was removed with a hot snare. Resection and retrieval were complete.       Cauterywas used for hemostasis post polypectomy.      Two sessile polyps were found in the transverse colon, proximal       transverse colon and mid transverse colon. The polyps were 5 to 10 mm in       size. These polyps were removed with a hot snare. Resection and       retrieval were complete. To prevent bleeding post-intervention, one       hemostatic clip was successfully placed (MR conditional) on the base of       the larger polyp. There was no bleeding during, or at the end, of the       procedure.      A 8 mm polyp was found in the sigmoid colon. The polyp was sessile. The       polyp was removed with a hot snare. Resection and retrieval were       complete.      A 5 mm polyp was found in the mid rectum. The polyp was sessile. The       polyp was removed with a hot snare. Resection and retrieval were       complete.      Hemorrhoids were found on perianal exam. Impression:            - One 5 mm polyp in the cecum, removed with a hot                         snare. Resected and retrieved.                        - Two 5 to 10 mm polyps in the transverse colon, in                         the proximal transverse colon and in the mid                         transverse colon, removed with a hot snare. Resected                         and retrieved. Clip (MR conditional) was placed.                        - One 8 mm polyp in the sigmoid colon, removed with a                         hot  snare. Resected and retrieved.                        - One 5 mm polyp in the mid rectum, removed with a hot                         snare. Resected and retrieved. Recommendation:        - Telephone endoscopist for pathology results in 1                         week. Robert Bellow, MD 11/04/2020 9:41:55  AM This report has been signed electronically. Number of Addenda: 0 Note Initiated On: 11/04/2020 8:33 AM Scope Withdrawal Time: 0 hours 24 minutes 4 seconds  Total Procedure Duration: 0 hours 30 minutes 56 seconds  Estimated Blood Loss:  Estimated blood loss: none.      [facility]

## 2020-11-04 NOTE — Anesthesia Preprocedure Evaluation (Signed)
Anesthesia Evaluation  Patient identified by MRN, date of birth, ID band Patient awake    Reviewed: Allergy & Precautions, NPO status , Patient's Chart, lab work & pertinent test results  Airway Mallampati: II  TM Distance: >3 FB Neck ROM: full    Dental no notable dental hx. (+) Chipped,    Pulmonary shortness of breath and with exertion, asthma , sleep apnea , pneumonia, COPD,  COPD inhaler, former smoker,    Pulmonary exam normal        Cardiovascular hypertension, + angina +CHF  Normal cardiovascular exam     Neuro/Psych negative neurological ROS  negative psych ROS   GI/Hepatic Neg liver ROS, GERD  ,  Endo/Other  negative endocrine ROS  Renal/GU negative Renal ROS  negative genitourinary   Musculoskeletal   Abdominal (+) + obese,   Peds  Hematology negative hematology ROS (+)   Anesthesia Other Findings Past Medical History: No date: Anginal pain (HCC) No date: Aspirin allergy No date: Asthma No date: CHF (congestive heart failure) (HCC) No date: COPD (chronic obstructive pulmonary disease) (HCC)     Comment:  oxygen at home as needed (& at night) No date: Dyspnea No date: Hyperlipidemia No date: Morbid obesity ([place]) No date: Tobacco abuse  Past Surgical History: No date: CESAREAN SECTION No date: KNEE ARTHROSCOPY     Comment:  right No date: TUBAL LIGATION  BMI    Body Mass Index: 46.48 kg/m      Reproductive/Obstetrics negative OB ROS                             Anesthesia Physical Anesthesia Plan  ASA: 2  Anesthesia Plan: General   Post-op Pain Management:    Induction: Intravenous  PONV Risk Score and Plan: Propofol infusion and TIVA  Airway Management Planned: Natural Airway and Nasal Cannula  Additional Equipment:   Intra-op Plan:   Post-operative Plan:   Informed Consent: I have reviewed the patients History and Physical, chart, labs and  discussed the procedure including the risks, benefits and alternatives for the proposed anesthesia with the patient or authorized representative who has indicated his/her understanding and acceptance.     Dental Advisory Given  Plan Discussed with: Anesthesiologist, CRNA and Surgeon  Anesthesia Plan Comments: (Patient consented for risks of anesthesia including but not limited to:  - adverse reactions to medications - risk of airway placement if required - damage to eyes, teeth, lips or other oral mucosa - nerve damage due to positioning  - sore throat or hoarseness - Damage to heart, brain, nerves, lungs, other parts of body or loss of life  Patient voiced understanding.)        Anesthesia Quick Evaluation

## 2020-11-04 NOTE — Transfer of Care (Signed)
Immediate Anesthesia Transfer of Care Note  Patient: Kelly Hale  Procedure(s) Performed: Procedure(s): COLONOSCOPY WITH PROPOFOL (N/A)  Patient Location: PACU and Endoscopy Unit  Anesthesia Type:General  Level of Consciousness: sedated  Airway & Oxygen Therapy: Patient Spontanous Breathing and Patient connected to nasal cannula oxygen  Post-op Assessment: Report given to RN and Post -op Vital signs reviewed and stable  Post vital signs: Reviewed and stable  Last Vitals:  Vitals:   11/04/20 0737 11/04/20 0919  BP: (!) 146/92 122/65  Pulse: (!) 108   Resp: 20   Temp: (!) 35.9 C (!) 36 C  SpO2: 0000000     Complications: No apparent anesthesia complications

## 2020-11-04 NOTE — H&P (Signed)
Kelly Hale [id number] Jan 23, 1956     HPI: 65 year old woman with a history of COPD and congestive heart failure with a positive fit test last year.  She reports at the time of her colonoscopy in 2009 she was told she had a "extra loop in her colon".  Past history of polyps at that time.  Pathology not available.  She tolerated the prep well.  Medications Prior to Admission  Medication Sig Dispense Refill Last Dose   albuterol (PROVENTIL HFA;VENTOLIN HFA) 108 (90 Base) MCG/ACT inhaler Inhale 1-2 puffs into the lungs every 6 (six) hours as needed for wheezing or shortness of breath. 3 Inhaler 0 Past Week   albuterol (PROVENTIL) (2.5 MG/3ML) 0.083% nebulizer solution Take 3 mLs (2.5 mg total) by nebulization every 6 (six) hours as needed for wheezing or shortness of breath. 300 mL 6 Past Week   amLODipine (NORVASC) 5 MG tablet Take 1 tablet (5 mg total) by mouth daily. 90 tablet 3 11/03/2020   aspirin 81 MG tablet Take 1 tablet (81 mg total) by mouth daily. 30 tablet 0 Past Week   fluticasone furoate-vilanterol (BREO ELLIPTA) 200-25 MCG/INH AEPB Inhale 1 puff into the lungs daily. 180 each 1 Past Week   furosemide (LASIX) 20 MG tablet Take 1 tablet (20 mg total) as directed by mouth. 1 tablet daily and extra dose in PM as needed for swelling 180 tablet 3 Past Week   Multiple Vitamin (MULTIVITAMIN) capsule Take 1 capsule by mouth daily.   Past Week   OXYGEN Inhale 3 L into the lungs daily. 2 liters daily   11/04/2020   rosuvastatin (CRESTOR) 10 MG tablet Take 1 tablet (10 mg total) by mouth daily. 90 tablet 3 11/03/2020   Allergies  Allergen Reactions   Aspirin Other (See Comments)    REACTION: intol-asthma Patient takes low dose of aspirin.   Mometasone Furo-Formoterol Fum     Other reaction(s): Unknown   Singulair [Montelukast]     Other reaction(s): Unknown   Tiotropium Bromide Monohydrate     Other reaction(s): Unknown   Past Medical History:  Diagnosis Date   Anginal pain (HCC)     Aspirin allergy    Asthma    CHF (congestive heart failure) (HCC)    COPD (chronic obstructive pulmonary disease) (HCC)    oxygen at home as needed (& at night)   Dyspnea    Hyperlipidemia    Morbid obesity ([place])    Tobacco abuse    Past Surgical History:  Procedure Laterality Date   CESAREAN SECTION     KNEE ARTHROSCOPY     right   TUBAL LIGATION     Social History   Socioeconomic History   Marital status: Married    Spouse name: Joe   Number of children: Not on file   Years of education: Not on file   Highest education level: Not on file  Occupational History   Occupation: Scientist, 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  Tobacco Use   Smoking status: Former    Packs/day: 0.75    Years: 40.00    Pack years: 30.00    Types: Cigarettes    Quit date: 07/16/2015    Years since quitting: 5.3   Smokeless tobacco: Never   Tobacco comments:    quit in Sept 2015, but started back smoking up to 2 cigs per day  Vaping Use   Vaping Use: Never used  Substance and Sexual Activity   Alcohol use: No  Alcohol/week: 0.0 standard drinks   Drug use: No   Sexual activity: Not on file  Other Topics Concern   Not on file  Social History Narrative   Not on file   Social Determinants of Health   Financial Resource Strain: Not on file  Food Insecurity: Not on file  Transportation Needs: Not on file  Physical Activity: Not on file  Stress: Not on file  Social Connections: Not on file  Intimate Partner Violence: Not on file   Social History   Social History Narrative   Not on file     ROS: Negative.     PE: HEENT: Negative. Lungs: Clear. Cardio: RR.   Assessment/Plan:  Proceed with planned endoscopy.   Forest Gleason [facility] 11/04/2020

## 2020-11-05 ENCOUNTER — Encounter: Payer: Self-pay | Admitting: General Surgery

## 2020-11-05 LAB — SURGICAL PATHOLOGY

## 2021-01-15 DIAGNOSIS — G8929 Other chronic pain: Secondary | ICD-10-CM | POA: Diagnosis not present

## 2021-01-15 DIAGNOSIS — I10 Essential (primary) hypertension: Secondary | ICD-10-CM | POA: Diagnosis not present

## 2021-01-15 DIAGNOSIS — J4530 Mild persistent asthma, uncomplicated: Secondary | ICD-10-CM | POA: Diagnosis not present

## 2021-01-15 DIAGNOSIS — J449 Chronic obstructive pulmonary disease, unspecified: Secondary | ICD-10-CM | POA: Diagnosis not present

## 2021-01-15 DIAGNOSIS — E785 Hyperlipidemia, unspecified: Secondary | ICD-10-CM | POA: Diagnosis not present

## 2021-01-15 DIAGNOSIS — I5032 Chronic diastolic (congestive) heart failure: Secondary | ICD-10-CM | POA: Diagnosis not present

## 2021-01-15 DIAGNOSIS — J441 Chronic obstructive pulmonary disease with (acute) exacerbation: Secondary | ICD-10-CM | POA: Diagnosis not present

## 2021-02-11 DIAGNOSIS — Z1231 Encounter for screening mammogram for malignant neoplasm of breast: Secondary | ICD-10-CM | POA: Diagnosis not present

## 2021-03-10 DIAGNOSIS — H2513 Age-related nuclear cataract, bilateral: Secondary | ICD-10-CM | POA: Diagnosis not present

## 2021-05-05 DIAGNOSIS — I10 Essential (primary) hypertension: Secondary | ICD-10-CM | POA: Diagnosis not present

## 2021-05-05 DIAGNOSIS — I5032 Chronic diastolic (congestive) heart failure: Secondary | ICD-10-CM | POA: Diagnosis not present

## 2021-05-05 DIAGNOSIS — E785 Hyperlipidemia, unspecified: Secondary | ICD-10-CM | POA: Diagnosis not present

## 2021-05-05 DIAGNOSIS — Z23 Encounter for immunization: Secondary | ICD-10-CM | POA: Diagnosis not present

## 2021-05-05 DIAGNOSIS — J4530 Mild persistent asthma, uncomplicated: Secondary | ICD-10-CM | POA: Diagnosis not present

## 2021-05-05 DIAGNOSIS — Z0000 Encounter for general adult medical examination without abnormal findings: Secondary | ICD-10-CM | POA: Diagnosis not present

## 2021-05-05 DIAGNOSIS — M8588 Other specified disorders of bone density and structure, other site: Secondary | ICD-10-CM | POA: Diagnosis not present

## 2021-05-05 DIAGNOSIS — I700 Atherosclerosis of aorta: Secondary | ICD-10-CM | POA: Diagnosis not present

## 2021-05-05 DIAGNOSIS — J9611 Chronic respiratory failure with hypoxia: Secondary | ICD-10-CM | POA: Diagnosis not present

## 2021-05-20 DIAGNOSIS — M8588 Other specified disorders of bone density and structure, other site: Secondary | ICD-10-CM | POA: Diagnosis not present

## 2021-05-20 DIAGNOSIS — M85852 Other specified disorders of bone density and structure, left thigh: Secondary | ICD-10-CM | POA: Diagnosis not present

## 2021-07-20 DIAGNOSIS — J441 Chronic obstructive pulmonary disease with (acute) exacerbation: Secondary | ICD-10-CM | POA: Diagnosis not present

## 2021-07-20 DIAGNOSIS — E785 Hyperlipidemia, unspecified: Secondary | ICD-10-CM | POA: Diagnosis not present

## 2021-07-20 DIAGNOSIS — I10 Essential (primary) hypertension: Secondary | ICD-10-CM | POA: Diagnosis not present

## 2021-07-20 DIAGNOSIS — I5032 Chronic diastolic (congestive) heart failure: Secondary | ICD-10-CM | POA: Diagnosis not present

## 2021-07-21 ENCOUNTER — Other Ambulatory Visit: Payer: Self-pay | Admitting: *Deleted

## 2021-07-21 ENCOUNTER — Telehealth: Payer: Self-pay | Admitting: Internal Medicine

## 2021-07-21 DIAGNOSIS — Z122 Encounter for screening for malignant neoplasm of respiratory organs: Secondary | ICD-10-CM

## 2021-07-21 DIAGNOSIS — Z87891 Personal history of nicotine dependence: Secondary | ICD-10-CM

## 2021-07-23 NOTE — Telephone Encounter (Signed)
Discussed situation with Kelly Hale, the patient was offered an appointment and she declined.  She has an upcoming appointment that worked better with her schedule.  Nothing further needed. ?

## 2021-07-30 ENCOUNTER — Encounter: Payer: Self-pay | Admitting: Adult Health

## 2021-07-30 ENCOUNTER — Ambulatory Visit: Payer: Medicare HMO | Admitting: Adult Health

## 2021-07-30 DIAGNOSIS — J432 Centrilobular emphysema: Secondary | ICD-10-CM | POA: Diagnosis not present

## 2021-07-30 DIAGNOSIS — G4733 Obstructive sleep apnea (adult) (pediatric): Secondary | ICD-10-CM | POA: Diagnosis not present

## 2021-07-30 DIAGNOSIS — Z9989 Dependence on other enabling machines and devices: Secondary | ICD-10-CM

## 2021-07-30 DIAGNOSIS — J309 Allergic rhinitis, unspecified: Secondary | ICD-10-CM | POA: Diagnosis not present

## 2021-07-30 MED ORDER — PREDNISONE 10 MG PO TABS: ORAL_TABLET | ORAL | 0 refills | Status: DC

## 2021-07-30 NOTE — Addendum Note (Signed)
Addended by: Claudette Head A on: 07/30/2021 03:43 PM ? ? Modules accepted: Orders ? ?

## 2021-07-30 NOTE — Assessment & Plan Note (Signed)
Flare -add claritin  ? ?Plan  ?Patient Instructions  ?Continue on CPAP At bedtime   ?Wear all night long .  ?Work on healthy weight loss.  ?Do not drive if sleepy  ?Order SD card ? ?Continue on BREO daily , rinse after use.  ?Prednisone taper over next week.  ?Claritin '10mg'$  daily As needed  drainage  ?Saline nasal rinses As needed   ?CT chest next week as planned  ?Continue on Oxygen 3l/m  ? ?Follow up with Dr. Mortimer Fries in 6 months and As needed   ?Please contact office for sooner follow up if symptoms do not improve or worsen or seek emergency care  ? ?  ? ?

## 2021-07-30 NOTE — Progress Notes (Signed)
? ?'@Patient'$  ID: Kelly Hale, female    DOB: [birth year], 66 y.o.   MRN: [id number] ? ?Chief Complaint  ?Patient presents with  ? Follow-up  ? ? ?Referring provider: ?Harlan Stains, MD ? 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 ?TEST/EVENTS :  ?PFTs 10/01/13: FVC:  2.19 L ( 60 %pred), FEV1:  1.50 L ( 53 %pred), FEV1/FVC: 69%, TLC:  4.94 L ( 92 %pred), DLCO  99 %pred ?PSG 04/11/14: AHI 64/hr. CPAP recommendation 16 cm H2O ?Compliance 03/26 - 08/02/16: 30/30 days, 30 days > 4 hrs.  ?Compliance 04/19 - 08/26/17: 30/30 days, 30 days > 4 hrs.  ?6MWT 09/27/17: 108 meters. Desaturated to 86% on RA, 88% on 2 LPM ? ?07/30/2021 Follow up : COPD, obstructive sleep apnea, chronic respiratory failure on oxygen ?Patient presents for a follow-up visit.  Last seen February 2022.  Patient has underlying moderate COPD with a previous FEV1 at 53%.  She is maintained on Breo daily.  She has albuterol inhaler and nebulizer to use as needed.  She participates in the lung cancer screening program.  Last CT was July 01, 2020 showed emphysema and bilateral pulmonary nodules measuring up to 5.8 mm.  She has an upcoming CT chest on August 02, 2021. ?Patient complains of over last 4 weeks has had increased sinus drainage with clear drainage, wheezing and dry cough . No fever, no hemoptysis , or edema.  ?No recent antibiotics.  ? ?Patient has underlying sleep apnea.  She wears her CPAP every single night.  Patient says she feels rested and benefits from her CPAP. She loves her CPAP can not sleep without it.  ?CPAP download was requested. SD card was ordered. Machine is 3 G .  ?Patient says she wears her CPAP every single night usually getting about 7-8 hours.  She is on CPAP at 16 cm H2O. ? ? ? ? ?Allergies  ?Allergen Reactions  ? Aspirin Other (See Comments)  ?  REACTION: intol-asthma ?Patient takes low dose of aspirin.  ?  Mometasone Furo-Formoterol Fum   ?  Other reaction(s): Unknown  ? Singulair [Montelukast]   ?  Other reaction(s): Unknown  ? Tiotropium Bromide Monohydrate   ?  Other reaction(s): Unknown  ? ? ?Immunization History  ?Administered Date(s) Administered  ? Influenza Split 04/20/2015  ? Influenza,inj,Quad PF,6+ Mos 12/27/2013, 02/12/2016, 03/02/2018  ? Influenza-Unspecified 12/16/2019  ? PFIZER(Purple Top)SARS-COV-2 Vaccination 07/05/2019, 07/26/2019, 02/08/2020  ? Pneumococcal Conjugate-13 05/05/2009  ? Pneumococcal-Unspecified 01/09/2013  ? Tdap 11/08/2005  ? ? ?Past Medical History:  ?Diagnosis Date  ? Anginal pain ([place])   ? Aspirin allergy   ? Asthma   ? CHF (congestive heart failure) ([place])   ? COPD (chronic obstructive pulmonary disease) ([place])   ? oxygen at home as needed (& at night)  ? Dyspnea   ? Hyperlipidemia   ? Morbid obesity ([place])   ? Tobacco abuse   ? ? ?Tobacco History: ?Social History  ? ?Tobacco Use  ?Smoking Status Former  ? Packs/day: 0.75  ? Years: 40.00  ? Pack years: 30.00  ? Types: Cigarettes  ? Quit date: 07/16/2015  ? Years since quitting: 6.0  ?Smokeless Tobacco Never  ?Tobacco Comments  ? quit in Sept 2015, but started back smoking up to 2 cigs per day  ? ?Counseling given: Not Answered ?Tobacco comments: quit in Sept 2015, but started back smoking up to 2  cigs per day ? ? ?Outpatient Medications Prior to Visit  ?Medication Sig Dispense Refill  ? albuterol (PROVENTIL HFA;VENTOLIN HFA) 108 (90 Base) MCG/ACT inhaler Inhale 1-2 puffs into the lungs every 6 (six) hours as needed for wheezing or shortness of breath. 3 Inhaler 0  ? albuterol (PROVENTIL) (2.5 MG/3ML) 0.083% nebulizer solution Take 3 mLs (2.5 mg total) by nebulization every 6 (six) hours as needed for wheezing or shortness of breath. 300 mL 6  ? amLODipine (NORVASC) 5 MG tablet Take 1 tablet (5 mg total) by mouth daily. 90 tablet 3  ? aspirin 81 MG tablet Take 1 tablet (81 mg total) by mouth daily. 30 tablet 0  ? fluticasone  furoate-vilanterol (BREO ELLIPTA) 200-25 MCG/INH AEPB Inhale 1 puff into the lungs daily. 180 each 1  ? furosemide (LASIX) 20 MG tablet Take 1 tablet (20 mg total) as directed by mouth. 1 tablet daily and extra dose in PM as needed for swelling 180 tablet 3  ? Multiple Vitamin (MULTIVITAMIN) capsule Take 1 capsule by mouth daily.    ? OXYGEN Inhale 3 L into the lungs daily. 2 liters daily    ? rosuvastatin (CRESTOR) 10 MG tablet Take 1 tablet (10 mg total) by mouth daily. 90 tablet 3  ? ?No facility-administered medications prior to visit.  ? ? ? ?Review of Systems:  ? ?Constitutional:   No  weight loss, night sweats,  Fevers, chills, fatigue, or  lassitude. ? ?HEENT:   No headaches,  Difficulty swallowing,  Tooth/dental problems, or  Sore throat,  ?              No sneezing, itching, ear ache,  ?+nasal congestion, post nasal drip,  ? ?CV:  No chest pain,  Orthopnea, PND, swelling in lower extremities, anasarca, dizziness, palpitations, syncope.  ? ?GI  No heartburn, indigestion, abdominal pain, nausea, vomiting, diarrhea, change in bowel habits, loss of appetite, bloody stools.  ? ?Resp: .  No chest wall deformity ? ?Skin: no rash or lesions. ? ?GU: no dysuria, change in color of urine, no urgency or frequency.  No flank pain, no hematuria  ? ?MS:  No joint pain or swelling.  No decreased range of motion.  No back pain. ? ? ? ?Physical Exam ? ?BP 134/72 (BP Location: Left Arm, Cuff Size: Large)   Pulse 100   Temp 97.7 ?F (36.5 ?C) (Temporal)   Ht '5\' 6"'$  (1.676 m)   Wt 288 lb (130.6 kg)   SpO2 92%   BMI 46.48 kg/m?  ? ?GEN: A/Ox3; pleasant , NAD, well nourished , On O2  ?  ?HEENT:  [place]/AT,   NOSE-clear, THROAT-clear, no lesions, no postnasal drip or exudate noted.  ? ?NECK:  Supple w/ fair ROM; no JVD; normal carotid impulses w/o bruits; no thyromegaly or nodules palpated; no lymphadenopathy.   ? ?RESP  Exp wheezing , speaks in full sentences   no accessory muscle use, no dullness to percussion ? ?CARD:  RRR, no  m/r/g, no peripheral edema, pulses intact, no cyanosis or clubbing. ? ?GI:   Soft & nt; nml bowel sounds; no organomegaly or masses detected.  ? ?Musco: Warm bil, no deformities or joint swelling noted.  ? ?Neuro: alert, no focal deficits noted.   ? ?Skin: Warm, no lesions or rashes ? ? ? ?Lab Results: ? ? ? ?BMET ? ? ?BNP ? ?No results found. ? ? ? ? ?  Latest Ref Rng & Units 10/01/2013  ? 12:59 PM  ?PFT  Results  ?FVC-Pre L 2.19    ?FVC-Predicted Pre % 60    ?FVC-Post L 2.18    ?FVC-Predicted Post % 60    ?Pre FEV1/FVC % % 69    ?Post FEV1/FCV % % 68    ?FEV1-Pre L 1.50    ?FEV1-Predicted Pre % 53    ?FEV1-Post L 1.48    ?DLCO uncorrected ml/min/mmHg 26.77    ?DLCO UNC% % 99    ?DLVA Predicted % 119    ?TLC L 4.94    ?TLC % Predicted % 92    ?RV % Predicted % 116    ? ? ?No results found for: NITRICOXIDE ? ? ? ? ? ?Assessment & Plan:  ? ?COPD with emphysema ([place]) ?Flare with asthma /allergies  ?Hold on abx at this time  ?CT chest next week (LDCT )  ? ?Plan  ?Patient Instructions  ?Continue on CPAP At bedtime   ?Wear all night long .  ?Work on healthy weight loss.  ?Do not drive if sleepy  ?Order SD card ? ?Continue on BREO daily , rinse after use.  ?Prednisone taper over next week.  ?Claritin '10mg'$  daily As needed  drainage  ?Saline nasal rinses As needed   ?CT chest next week as planned  ?Continue on Oxygen 3l/m  ? ?Follow up with Dr. Mortimer Fries in 6 months and As needed   ?Please contact office for sooner follow up if symptoms do not improve or worsen or seek emergency care  ? ?  ? ? ?OSA on CPAP ?Excellent control and compliance  ?Order for SD card  ? ?Plan  ?Patient Instructions  ?Continue on CPAP At bedtime   ?Wear all night long .  ?Work on healthy weight loss.  ?Do not drive if sleepy  ?Order SD card ? ?Continue on BREO daily , rinse after use.  ?Prednisone taper over next week.  ?Claritin '10mg'$  daily As needed  drainage  ?Saline nasal rinses As needed   ?CT chest next week as planned  ?Continue on Oxygen 3l/m   ? ?Follow up with Dr. Mortimer Fries in 6 months and As needed   ?Please contact office for sooner follow up if symptoms do not improve or worsen or seek emergency care  ? ?  ? ? ?Morbid obesity ?Healthy weight loss  ? ?Allergic rhi

## 2021-07-30 NOTE — Patient Instructions (Addendum)
Continue on CPAP At bedtime   ?Wear all night long .  ?Work on healthy weight loss.  ?Do not drive if sleepy  ?Order SD card ? ?Continue on BREO daily , rinse after use.  ?Prednisone taper over next week.  ?Claritin '10mg'$  daily As needed  drainage  ?Saline nasal rinses As needed   ?CT chest next week as planned  ?Continue on Oxygen 3l/m  ? ?Follow up with Dr. Mortimer Fries in 6 months and As needed   ?Please contact office for sooner follow up if symptoms do not improve or worsen or seek emergency care  ? ?

## 2021-07-30 NOTE — Assessment & Plan Note (Signed)
Flare with asthma /allergies  ?Hold on abx at this time  ?CT chest next week (LDCT )  ? ?Plan  ?Patient Instructions  ?Continue on CPAP At bedtime   ?Wear all night long .  ?Work on healthy weight loss.  ?Do not drive if sleepy  ?Order SD card ? ?Continue on BREO daily , rinse after use.  ?Prednisone taper over next week.  ?Claritin '10mg'$  daily As needed  drainage  ?Saline nasal rinses As needed   ?CT chest next week as planned  ?Continue on Oxygen 3l/m  ? ?Follow up with Dr. Mortimer Fries in 6 months and As needed   ?Please contact office for sooner follow up if symptoms do not improve or worsen or seek emergency care  ? ?  ? ?

## 2021-07-30 NOTE — Assessment & Plan Note (Addendum)
Excellent control and compliance  ?Order for SD card  ? ?Plan  ?Patient Instructions  ?Continue on CPAP At bedtime   ?Wear all night long .  ?Work on healthy weight loss.  ?Do not drive if sleepy  ?Order SD card ? ?Continue on BREO daily , rinse after use.  ?Prednisone taper over next week.  ?Claritin '10mg'$  daily As needed  drainage  ?Saline nasal rinses As needed   ?CT chest next week as planned  ?Continue on Oxygen 3l/m  ? ?Follow up with Dr. Mortimer Fries in 6 months and As needed   ?Please contact office for sooner follow up if symptoms do not improve or worsen or seek emergency care  ? ?  ? ?

## 2021-07-30 NOTE — Assessment & Plan Note (Signed)
Healthy weight loss 

## 2021-08-02 ENCOUNTER — Ambulatory Visit: Admission: RE | Admit: 2021-08-02 | Payer: Medicare HMO | Source: Ambulatory Visit

## 2021-08-03 ENCOUNTER — Ambulatory Visit
Admission: RE | Admit: 2021-08-03 | Discharge: 2021-08-03 | Disposition: A | Discharge status: Home / Self Care | Payer: Medicare HMO | Source: Ambulatory Visit | Attending: Acute Care | Admitting: Acute Care

## 2021-08-03 DIAGNOSIS — Z122 Encounter for screening for malignant neoplasm of respiratory organs: Secondary | ICD-10-CM | POA: Insufficient documentation

## 2021-08-03 DIAGNOSIS — Z87891 Personal history of nicotine dependence: Secondary | ICD-10-CM | POA: Insufficient documentation

## 2021-08-05 ENCOUNTER — Other Ambulatory Visit: Payer: Self-pay | Admitting: Acute Care

## 2021-08-05 DIAGNOSIS — Z87891 Personal history of nicotine dependence: Secondary | ICD-10-CM

## 2021-08-05 DIAGNOSIS — Z122 Encounter for screening for malignant neoplasm of respiratory organs: Secondary | ICD-10-CM

## 2021-08-27 DIAGNOSIS — I5032 Chronic diastolic (congestive) heart failure: Secondary | ICD-10-CM | POA: Diagnosis not present

## 2021-08-27 DIAGNOSIS — G8929 Other chronic pain: Secondary | ICD-10-CM | POA: Diagnosis not present

## 2021-08-27 DIAGNOSIS — I10 Essential (primary) hypertension: Secondary | ICD-10-CM | POA: Diagnosis not present

## 2021-08-27 DIAGNOSIS — E785 Hyperlipidemia, unspecified: Secondary | ICD-10-CM | POA: Diagnosis not present

## 2021-08-27 DIAGNOSIS — J4530 Mild persistent asthma, uncomplicated: Secondary | ICD-10-CM | POA: Diagnosis not present

## 2021-08-27 DIAGNOSIS — J449 Chronic obstructive pulmonary disease, unspecified: Secondary | ICD-10-CM | POA: Diagnosis not present

## 2021-11-02 DIAGNOSIS — J449 Chronic obstructive pulmonary disease, unspecified: Secondary | ICD-10-CM | POA: Diagnosis not present

## 2021-11-02 DIAGNOSIS — R002 Palpitations: Secondary | ICD-10-CM | POA: Diagnosis not present

## 2021-11-02 DIAGNOSIS — I700 Atherosclerosis of aorta: Secondary | ICD-10-CM | POA: Diagnosis not present

## 2021-11-02 DIAGNOSIS — E785 Hyperlipidemia, unspecified: Secondary | ICD-10-CM | POA: Diagnosis not present

## 2021-11-02 DIAGNOSIS — J9611 Chronic respiratory failure with hypoxia: Secondary | ICD-10-CM | POA: Diagnosis not present

## 2021-11-02 DIAGNOSIS — I10 Essential (primary) hypertension: Secondary | ICD-10-CM | POA: Diagnosis not present

## 2021-11-02 DIAGNOSIS — I5032 Chronic diastolic (congestive) heart failure: Secondary | ICD-10-CM | POA: Diagnosis not present

## 2021-11-22 DIAGNOSIS — I10 Essential (primary) hypertension: Secondary | ICD-10-CM | POA: Diagnosis not present

## 2021-11-22 DIAGNOSIS — J449 Chronic obstructive pulmonary disease, unspecified: Secondary | ICD-10-CM | POA: Diagnosis not present

## 2021-11-22 DIAGNOSIS — E785 Hyperlipidemia, unspecified: Secondary | ICD-10-CM | POA: Diagnosis not present

## 2021-11-22 DIAGNOSIS — G8929 Other chronic pain: Secondary | ICD-10-CM | POA: Diagnosis not present

## 2021-11-22 DIAGNOSIS — I5032 Chronic diastolic (congestive) heart failure: Secondary | ICD-10-CM | POA: Diagnosis not present

## 2021-11-22 DIAGNOSIS — J4530 Mild persistent asthma, uncomplicated: Secondary | ICD-10-CM | POA: Diagnosis not present

## 2021-12-27 DIAGNOSIS — J4530 Mild persistent asthma, uncomplicated: Secondary | ICD-10-CM | POA: Diagnosis not present

## 2021-12-27 DIAGNOSIS — G8929 Other chronic pain: Secondary | ICD-10-CM | POA: Diagnosis not present

## 2021-12-27 DIAGNOSIS — J449 Chronic obstructive pulmonary disease, unspecified: Secondary | ICD-10-CM | POA: Diagnosis not present

## 2021-12-27 DIAGNOSIS — E785 Hyperlipidemia, unspecified: Secondary | ICD-10-CM | POA: Diagnosis not present

## 2021-12-27 DIAGNOSIS — I10 Essential (primary) hypertension: Secondary | ICD-10-CM | POA: Diagnosis not present

## 2022-02-14 DIAGNOSIS — Z1231 Encounter for screening mammogram for malignant neoplasm of breast: Secondary | ICD-10-CM | POA: Diagnosis not present

## 2022-02-15 ENCOUNTER — Telehealth: Payer: Self-pay

## 2022-02-15 NOTE — Telephone Encounter (Signed)
Spoke to patient and requested that she bring SD card to 02/16/2022 visit.

## 2022-02-16 ENCOUNTER — Ambulatory Visit: Payer: Medicare HMO | Admitting: Internal Medicine

## 2022-02-16 ENCOUNTER — Encounter: Payer: Self-pay | Admitting: Internal Medicine

## 2022-02-16 ENCOUNTER — Telehealth: Payer: Self-pay | Admitting: Internal Medicine

## 2022-02-16 VITALS — BP 132/80 | HR 114 | Temp 98.5°F | Ht 66.0 in | Wt 281.2 lb

## 2022-02-16 DIAGNOSIS — J439 Emphysema, unspecified: Secondary | ICD-10-CM | POA: Diagnosis not present

## 2022-02-16 DIAGNOSIS — G4733 Obstructive sleep apnea (adult) (pediatric): Secondary | ICD-10-CM

## 2022-02-16 DIAGNOSIS — J9611 Chronic respiratory failure with hypoxia: Secondary | ICD-10-CM | POA: Diagnosis not present

## 2022-02-16 DIAGNOSIS — J449 Chronic obstructive pulmonary disease, unspecified: Secondary | ICD-10-CM | POA: Diagnosis not present

## 2022-02-16 DIAGNOSIS — R0689 Other abnormalities of breathing: Secondary | ICD-10-CM | POA: Diagnosis not present

## 2022-02-16 MED ORDER — SPIRIVA RESPIMAT 2.5 MCG/ACT IN AERS: 2.0000 | INHALATION_SPRAY | Freq: Every day | RESPIRATORY_TRACT | 10 refills | Status: DC

## 2022-02-16 MED ORDER — SPIRIVA RESPIMAT 2.5 MCG/ACT IN AERS: 2.0000 | INHALATION_SPRAY | Freq: Every day | RESPIRATORY_TRACT | 0 refills | Status: DC

## 2022-02-16 NOTE — Progress Notes (Signed)
$'@Patient'x$  ID: Kelly Hale, female    DOB: Sep 12, 1955, 66 y.o.   MRN: [id number]   Referring provider: Harlan Stains, MD      SYNOPSIS 66 year old female, former smoker quit in 2017 (30-pack-year history). Past medical history significant for obstructive sleep apnea, moderate COPD. Former patient of Dr. Alva Garnet, last seen for televisit in June 2020. PSG in 2016 showed an AHI of 64/hr- CPAP at 16 cm H2O. Maintained on Breo and as needed albuterol. O2 dependent.    [facility] 09/23/19- 10/22/19: 30/30 days (100%) Usage 8 hours 54mn  Pressure 16cm h20 [place] 1.2 (95%) AHI 0.4  DATA: PFTs 10/01/13: FVC:  2.19 L ( 60 %pred), FEV1:  1.50 L ( 53 %pred), FEV1/FVC: 69%, TLC:  4.94 L ( 92 %pred), DLCO  99 %pred PSG 04/11/14: AHI 64/hr. CPAP recommendation 16 cm H2O Compliance 03/26 - 08/02/16: 30/30 days, 30 days > 4 hrs.  Compliance 04/19 - 08/26/17: 30/30 days, 30 days > 4 hrs.  6MWT 09/27/17: 108 meters. Desaturated to 86% on RA, 88% on 2 LPM CPAP compliance 10/21-11/19/19: 30/30 nights. CPAP 16 cm H2O, Mean AHI 0.3      CC  Follow-up OSA Follow-up COPD  Follow-up chronic hypoxic respiratory failure    HPI Follow-up severe OSA Compliance report reviewed in detail Fullface mask CPAP 16 cm water pressure AHI reduced to 0.2 Patient uses and benefits from therapy  COPD seems to be stable at this time Albuterol as needed No additional therapy at this time Maintained on current inhalers  Chronic Hypoxic resp failure due to COPD -recommend using oxygen as prescribed -patient needs this for survival  + exacerbation at this time due to second hand smoke No evidence of heart failure at this time No evidence or signs of infection at this time No respiratory distress No fevers, chills, nausea, vomiting, diarrhea No evidence of lower extremity edema No evidence hemoptysis         Allergies  Allergen Reactions   Aspirin Other (See Comments)    REACTION:  intol-asthma Patient takes low dose of aspirin.   Mometasone Furo-Formoterol Fum     Other reaction(s): Unknown   Singulair [Montelukast]     Other reaction(s): Unknown   Tiotropium Bromide Monohydrate     Other reaction(s): Unknown    Immunization History  Administered Date(s) Administered   Influenza Split 04/20/2015   Influenza,inj,Quad PF,6+ Mos 12/27/2013, 02/12/2016, 03/02/2018   Influenza-Unspecified 12/16/2019   PFIZER(Purple Top)SARS-COV-2 Vaccination 07/05/2019, 07/26/2019, 02/08/2020   Pneumococcal Conjugate-13 05/05/2009   Pneumococcal-Unspecified 01/09/2013   Tdap 11/08/2005    Past Medical History:  Diagnosis Date   Anginal pain (HCC)    Aspirin allergy    Asthma    CHF (congestive heart failure) (HCC)    COPD [place] disease) ([place]    oxygen at home as needed (& at night)   Dyspnea    Hyperlipidemia    [place]bid [place]    Tobacco abuse     Tobacco History: Social History   Tobacco Use  Smoking Status Former   Packs/day: 0.75   Years: 40.00   Total pack years: 30.00   Types: Cigarettes   Quit date: 07/16/2015   Years since quitting: 6.5  Smokeless Tobacco Never  Tobacco Comments   quit in Sept 2015, but started back smoking up to 2 cigs per day   Counseling given: Not Answered Tobacco comments: quit in Sept 2015, but started back smoking up to 2 cigs per day  Outpatient Medications Prior to Visit  Medication Sig Dispense Refill   albuterol (PROVENTIL HFA;VENTOLIN HFA) 108 (90 Base) MCG/ACT inhaler Inhale 1-2 puffs into the lungs every 6 (six) hours as needed for wheezing or shortness of breath. 3 Inhaler 0   albuterol (PROVENTIL) (2.5 MG/3ML) 0.083% nebulizer solution Take 3 mLs (2.5 mg total) by nebulization every 6 (six) hours as needed for wheezing or shortness of breath. 300 mL 6   amLODipine (NORVASC) 5 MG tablet Take 1 tablet (5 mg total) by mouth daily. 90 tablet 3   aspirin 81 MG tablet Take 1 tablet (81 mg  total) by mouth daily. 30 tablet 0   fluticasone furoate-vilanterol (BREO ELLIPTA) 200-25 MCG/INH AEPB Inhale 1 puff into the lungs daily. 180 each 1   furosemide (LASIX) 20 MG tablet Take 1 tablet (20 mg total) as directed by mouth. 1 tablet daily and extra dose in PM as needed for swelling 180 tablet 3   Multiple Vitamin (MULTIVITAMIN) capsule Take 1 capsule by mouth daily.     OXYGEN Inhale 3 L into the lungs daily. 2 liters daily     predniSONE (DELTASONE) 10 MG tablet 4 tabs for 2 days, then 3 tabs for 2 days, 2 tabs for 2 days, then 1 tab for 2 days, then stop 20 tablet 0   rosuvastatin (CRESTOR) 10 MG tablet Take 1 tablet (10 mg total) by mouth daily. 90 tablet 3   No facility-administered medications prior to visit.     BP 132/80 (BP Location: Left Wrist, Cuff Size: Normal)   Pulse (!) 114   Temp 98.5 F (36.9 C)   Ht '5\' 6"'$  (1.676 m)   Wt 281 lb 3.2 oz (127.6 kg)   SpO2 94%   BMI 45.39 kg/m     Review of Systems: Gen:  Denies  fever, sweats, chills weight loss  HEENT: Denies blurred vision, double vision, ear pain, eye pain, hearing loss, nose bleeds, sore throat Cardiac:  No dizziness, chest pain or heaviness, chest tightness,edema, No JVD Resp:   No cough, -sputum production, +shortness of breath,+wheezing, -hemoptysis,  Other:  All other systems negative    Physical Examination:   General Appearance: No distress  EYES PERRLA, EOM intact.   NECK Supple, No JVD Pulmonary: normal breath sounds,+wheezing.  CardiovascularNormal S1,S2.  No m/r/g.   Abdomen: Benign, Soft, non-tender. ALL OTHER ROS ARE NEGATIVE       ASSESSMENT AND PLAN  66 year old white female morbidly obese with severe underlying sleep apnea in the setting of moderate COPD with emphysema and chronic hypoxic respiratory failure with poor respiratory insufficiency and very deconditioned state   COPD with emphysema Moderate obstructive lung disease with FEV1 58% predicted Continue Breo 200 as  prescribed will add SPIRIVA RESPIMAT 2.5  Albuterol as needed NEBS AS NEEDED  Sleep apnea on CPAP Severe with AHI of 64 Previous compliance report reviewed in detail - Pressure 16 cmH2O; AHI 0.4 - No changes today - Continue CPAP every night for 4-6 hours or more   LUNG CANCER SCREENING PROTOCOL Previous scan 07/2021 No abnormal nodules/masses Follow up protocol  Obesity -recommend significant weight loss -recommend changing diet  Deconditioned state -Recommend increased daily activity and exercise    MEDICATION ADJUSTMENTS/LABS AND TESTS ORDERED: CONTINUE CPAP as prescribed  Continue inhalers as prescribed Continue Oxygen as prescribed AVOID second hand smoke! Lung cancer screening protocol   CURRENT MEDICATIONS REVIEWED AT LENGTH WITH PATIENT TODAY   Patient/Family are satisfied with Plan of action and management.  All questions answered  FOLLOW UP 1 YEAR  TOTAL TIME SPENT 32 mins     Brodi Kari Patricia Pesa, M.D.  [facility]  Medical Director [place] Director [facility] Cardio-Pulmonary Department

## 2022-02-16 NOTE — Patient Instructions (Addendum)
CONTINUE CPAP as prescribed  Continue inhalers as prescribed ADD Spiriva Respimat 2.5 Continue Oxygen as prescribed-Oxygen sat 88% and above AVOID second hand smoke! Lung cancer screening protocol

## 2022-02-16 NOTE — Telephone Encounter (Signed)
I spoke with the patient and let her know I am forwarding her message to Dr. Mortimer Fries and I will call her back once her has responded.

## 2022-02-17 MED ORDER — PREDNISONE 10 MG (21) PO TBPK: ORAL_TABLET | ORAL | 0 refills | Status: DC

## 2022-02-17 NOTE — Telephone Encounter (Signed)
I do not see prednisone mentioned in ov.  Spoke to patient, she stated that Dr. Mortimer Fries mentioned that she had some wheezing yesterday during [place]. She also reports of prod cough with clear sputum. SOB is baseline.  Denied f/c/s or additional sx.  Dr. Patsey Berthold, please advise. Dr. Mortimer Fries is unavailable.

## 2022-02-17 NOTE — Telephone Encounter (Signed)
Patient is aware of below message/recommendations and voiced her understanding.  Prednisone has been sent to preferred pharmacy.  Nothing further needed.

## 2022-02-17 NOTE — Telephone Encounter (Signed)
Responding for Dr. Mortimer Fries.  I have reviewed the patient's note from yesterday.  OK to send in a prednisone taper pack.

## 2022-02-18 DIAGNOSIS — I5032 Chronic diastolic (congestive) heart failure: Secondary | ICD-10-CM | POA: Diagnosis not present

## 2022-02-18 DIAGNOSIS — E785 Hyperlipidemia, unspecified: Secondary | ICD-10-CM | POA: Diagnosis not present

## 2022-02-18 DIAGNOSIS — J441 Chronic obstructive pulmonary disease with (acute) exacerbation: Secondary | ICD-10-CM | POA: Diagnosis not present

## 2022-02-18 DIAGNOSIS — I10 Essential (primary) hypertension: Secondary | ICD-10-CM | POA: Diagnosis not present

## 2022-02-18 DIAGNOSIS — G8929 Other chronic pain: Secondary | ICD-10-CM | POA: Diagnosis not present

## 2022-02-28 ENCOUNTER — Ambulatory Visit: Payer: Medicare HMO | Admitting: Internal Medicine

## 2022-03-01 ENCOUNTER — Telehealth: Payer: Self-pay | Admitting: *Deleted

## 2022-03-01 NOTE — Telephone Encounter (Signed)
[place] [facility]) calling to confirm fax information for oxygen orders were sent over to office. Please call and confirm [phone]

## 2022-03-01 NOTE — Telephone Encounter (Signed)
I called [employer], they were just confirming we received the orders. I have confirmed that we did get them and we are waiting for a signature.  Dr. Mortimer Fries, they are in your folder to be signed.

## 2022-03-08 NOTE — Telephone Encounter (Signed)
The orders have been signed and faxed back to [employer].

## 2022-03-14 ENCOUNTER — Telehealth: Payer: Self-pay | Admitting: Internal Medicine

## 2022-03-14 DIAGNOSIS — G4733 Obstructive sleep apnea (adult) (pediatric): Secondary | ICD-10-CM

## 2022-03-14 NOTE — Telephone Encounter (Signed)
Order placed to [employer] for SD card. Patient is aware and voiced her understanding.  Nothing further needed.

## 2022-03-17 DIAGNOSIS — H2513 Age-related nuclear cataract, bilateral: Secondary | ICD-10-CM | POA: Diagnosis not present

## 2022-05-13 DIAGNOSIS — E559 Vitamin D deficiency, unspecified: Secondary | ICD-10-CM | POA: Diagnosis not present

## 2022-05-13 DIAGNOSIS — Z0000 Encounter for general adult medical examination without abnormal findings: Secondary | ICD-10-CM | POA: Diagnosis not present

## 2022-05-13 DIAGNOSIS — J449 Chronic obstructive pulmonary disease, unspecified: Secondary | ICD-10-CM | POA: Diagnosis not present

## 2022-05-13 DIAGNOSIS — J9611 Chronic respiratory failure with hypoxia: Secondary | ICD-10-CM | POA: Diagnosis not present

## 2022-05-13 DIAGNOSIS — I10 Essential (primary) hypertension: Secondary | ICD-10-CM | POA: Diagnosis not present

## 2022-05-13 DIAGNOSIS — I700 Atherosclerosis of aorta: Secondary | ICD-10-CM | POA: Diagnosis not present

## 2022-05-13 DIAGNOSIS — J4530 Mild persistent asthma, uncomplicated: Secondary | ICD-10-CM | POA: Diagnosis not present

## 2022-05-13 DIAGNOSIS — E785 Hyperlipidemia, unspecified: Secondary | ICD-10-CM | POA: Diagnosis not present

## 2022-05-13 DIAGNOSIS — I5032 Chronic diastolic (congestive) heart failure: Secondary | ICD-10-CM | POA: Diagnosis not present

## 2022-05-18 NOTE — Progress Notes (Unsigned)
Cardiology Office Note    Date:  05/19/2022   ID:  [name], DOB Apr 08, 1956, MRN [id number]  PCP:  Harlan Stains, MD  Cardiologist:  Ida Rogue, MD  Electrophysiologist:  None   Chief Complaint: Follow-up  History of Present Illness:   Kelly Hale is a 67 y.o. female with history of coronary artery calcification noted on CT imaging, aortic atherosclerosis, HFpEF, chronic hypoxic respiratory failure on supplemental oxygen, severe COPD with long history of tobacco use quitting in 2017, HTN, obesity, and OSA on CPAP who presents for follow-up of HFpEF.  She was previously admitted to the hospital in 2015 with acute respiratory failure secondary to acute on chronic HFpEF.  She was treated with IV diuresis, steroids, and antibiotics with symptomatic improvement.  Echo at that time showed an EF of 60 to 65%, mild LVH, diastolic dysfunction, normal RV systolic function and ventricular cavity size, and normal RVSP.  No prior ischemic evaluation on file for review.  She was last seen in the office in 05/2020 and reported chronic shortness of breath with sedentary lifestyle.  She reported intermittent left-sided chest discomfort that was felt to be atypical.  Further testing was deferred.  She comes in and is without symptoms of angina or cardiac decompensation.  She notes chronic stable dyspnea that is typically more pronounced when not wearing her oxygen in the shower or while cooking dinner.  She also notes a several month history of palpitations described as a "pounding" sensation that are noticeable at nighttime when laying down to go to sleep.  No associated symptoms.  No falls or symptoms concerning for bleeding.  She reports her PCP recently obtained blood work just a couple of weeks ago.  No progressive orthopnea or early satiety.  She remains on daily low-dose Lasix and has not needed any as needed furosemide.  Her weight is down 10 pounds when compared to her last visit in our  office in 2022.   Labs independently reviewed: 06/2016 - BNP 10, Hgb 14.3, PLT 337, potassium 3.5, BUN 15, serum creatinine 0.75 03/2015 - albumin 3.1, AST/ALT normal 07/2014 - TC 227, TG 136, HDL 64, LDL 136 12/2013 - A1c 6.0, TSH normal  Past Medical History:  Diagnosis Date   Anginal pain (HCC)    Aspirin allergy    Asthma    CHF (congestive heart failure) (HCC)    COPD (chronic obstructive pulmonary disease) (HCC)    oxygen at home as needed (& at night)   Dyspnea    Hyperlipidemia    Morbid obesity ([place])    Tobacco abuse     Past Surgical History:  Procedure Laterality Date   CESAREAN SECTION     COLONOSCOPY WITH PROPOFOL N/A 11/04/2020   Procedure: COLONOSCOPY WITH PROPOFOL;  Surgeon: Robert Bellow, MD;  Location: [facility];  Service: Gastroenterology;  Laterality: N/A;   KNEE ARTHROSCOPY     right   TUBAL LIGATION      Current Medications: Current Meds  Medication Sig   albuterol (PROVENTIL HFA;VENTOLIN HFA) 108 (90 Base) MCG/ACT inhaler Inhale 1-2 puffs into the lungs every 6 (six) hours as needed for wheezing or shortness of breath.   albuterol (PROVENTIL) (2.5 MG/3ML) 0.083% nebulizer solution Take 3 mLs (2.5 mg total) by nebulization every 6 (six) hours as needed for wheezing or shortness of breath.   amLODipine (NORVASC) 5 MG tablet Take 1 tablet (5 mg total) by mouth daily.   aspirin 81 MG tablet Take 1 tablet (  81 mg total) by mouth daily.   Cholecalciferol (VITAMIN D3) 1000 units CAPS Take 1,000 Units by mouth daily.   ELDERBERRY PO Take by mouth. 1 gummy daily   fluticasone furoate-vilanterol (BREO ELLIPTA) 200-25 MCG/ACT AEPB Inhale 1 puff into the lungs daily.   furosemide (LASIX) 20 MG tablet Take 1 tablet (20 mg total) as directed by mouth. 1 tablet daily and extra dose in PM as needed for swelling   Multiple Vitamin (MULTIVITAMIN) capsule Take 1 capsule by mouth daily.   OVER THE COUNTER MEDICATION Vinegar and Ginger   1 Capsule Daily   OXYGEN  Inhale 3 L into the lungs daily. 2 liters daily   rosuvastatin (CRESTOR) 10 MG tablet Take 1 tablet (10 mg total) by mouth daily.   Tiotropium Bromide Monohydrate (SPIRIVA RESPIMAT) 2.5 MCG/ACT AERS Inhale 2 puffs into the lungs daily.    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  Tobacco Use   Smoking status: Former    Packs/day: 0.75    Years: 40.00    Total pack years: 30.00    Types: Cigarettes    Quit date: 07/16/2015    Years since quitting: 6.8   Smokeless tobacco: Never   Tobacco comments:    quit in Sept 2015, but started back smoking up to 2 cigs per day  Vaping Use   Vaping Use: Never used  Substance and Sexual Activity   Alcohol use: No    Alcohol/week: 0.0 standard drinks of alcohol   Drug use: No   Sexual activity: Not on file  Other Topics Concern   Not on file  Social History Narrative   Not on file   Social Determinants of Health   Financial Resource Strain: Not on file  Food Insecurity: Not on file  Transportation Needs: Not on file  Physical Activity: Not on file  Stress: Not on file  Social Connections: Not on file     Family History:  The patient's family history includes Alzheimer's disease in her maternal grandmother; Asthma in her mother; Diabetes in her paternal grandfather; Hyperlipidemia in her maternal grandfather and mother; Hypertension in her maternal grandfather, maternal grandmother, and mother; Other in her father, maternal uncle, and mother; Stroke in her maternal grandfather.  ROS:   12-point review of systems is negative unless otherwise noted in the HPI.   EKGs/Labs/Other  Studies Reviewed:    Studies reviewed were summarized above. The additional studies were reviewed today:  2D echo 12/2013: EF 60 to 65%, mild LVH, diastolic dysfunction, normal RV systolic function and ventricular cavity size, and normal RVSP   EKG:  EKG is ordered today.  The EKG ordered today demonstrates NSR, 97 bpm, poor R wave progression along the precordial leads, no acute ST-T changes  Recent Labs: No results found for requested labs within last 365 days.  Recent Lipid Panel    Component Value Date/Time   CHOL 128 12/24/2013 0508   TRIG 65 12/24/2013 0508   HDL 50 12/24/2013 0508   VLDL 13 12/24/2013 0508   LDLCALC 65 12/24/2013 0508    PHYSICAL EXAM:    VS:  BP 118/74 (BP Location: Left Arm, Patient Position: Sitting, Cuff Size: Normal) Comment (Cuff Size): forearm  Pulse 97   Ht '5\' 6"'$  (1.676 m)   Wt 279 lb (126.6 kg)   SpO2 97% Comment: 3 liters  BMI 45.03 kg/m   BMI: Body mass index is 45.03 kg/m.  Physical Exam Vitals reviewed.  Constitutional:      Appearance: She is well-developed.  HENT:     Head: Normocephalic and atraumatic.  Eyes:     General:        Right eye: No discharge.        Left eye: No discharge.  Neck:     Vascular: No JVD.  Cardiovascular:     Rate and Rhythm: Normal rate and regular rhythm.     Heart sounds: Normal heart sounds, S1 normal and S2 normal. Heart sounds not distant. No midsystolic click and no opening snap. No murmur heard.    No friction rub.  Pulmonary:     Effort: Pulmonary effort is normal. No respiratory distress.     Breath sounds: Normal breath sounds. No decreased breath sounds, wheezing or rales.     Comments: Supplemental oxygen noted via nasal cannula. Chest:     Chest wall: No tenderness.  Abdominal:     General: There is no distension.  Musculoskeletal:     Cervical back: Normal range of motion.  Skin:    General: Skin is warm and dry.     Nails: There is no clubbing.  Neurological:     Mental  Status: She is alert and oriented to person, place, and time.  Psychiatric:        Speech: Speech normal.        Behavior: Behavior normal.        Thought Content: Thought content normal.        Judgment: Judgment normal.     Wt Readings from Last 3 Encounters:  05/19/22 279 lb (126.6 kg)  02/16/22 281 lb 3.2 oz (127.6 kg)  07/30/21 288 lb (130.6 kg)     ASSESSMENT & PLAN:   CAD native coronary arteries without angina: She is without symptoms of angina or cardiac decompensation.  Continue aggressive risk factor modification and primary prevention including aspirin, amlodipine, and rosuvastatin.  HFpEF: Volume status is difficult to assess on physical exam secondary to body habitus, though she appears euvolemic and well compensated.  She remains on low-dose furosemide 20 mg daily and has not needed any extra as needed dosing.  Obtain labs from PCPs office to ensure stable renal function and electrolytes.  Palpitations: Place ZIO XT.  Symptoms appear to be consistent with PVCs.  Obtain blood work from PCPs office.  Further recommendations regarding potential escalation of medications and imaging pending results.  HTN: Blood pressure is well-controlled in the office today.  She remains on amlodipine.  Chronic hypoxic respiratory failure with severe COPD and prior tobacco use: Stable, without exacerbation.  Follow-up with PCP and pulmonology as directed.  Obesity with OSA: Her weight is down 10 pounds today when compared to her last visit in our office in 05/2020.  Continued weight loss is encouraged to healthy diet and regular exercise.   Disposition: F/u with Dr. [name] or an APP in 6 weeks.   Medication Adjustments/Labs and Tests Ordered: Current medicines are reviewed at length with the patient today.  Concerns regarding medicines are outlined above. Medication changes, Labs and Tests ordered today are summarized above and listed in the Patient Instructions accessible  in Encounters.    SignedChristell Faith, PA-C 05/19/2022 4:09 PM     [facility] [address] [place] [address] [place], [place] [address] [phone]

## 2022-05-19 ENCOUNTER — Ambulatory Visit (INDEPENDENT_AMBULATORY_CARE_PROVIDER_SITE_OTHER): Payer: Medicare HMO

## 2022-05-19 ENCOUNTER — Ambulatory Visit: Payer: Medicare HMO | Attending: Physician Assistant | Admitting: Physician Assistant

## 2022-05-19 ENCOUNTER — Encounter: Payer: Self-pay | Admitting: Physician Assistant

## 2022-05-19 VITALS — BP 118/74 | HR 97 | Ht 66.0 in | Wt 279.0 lb

## 2022-05-19 DIAGNOSIS — J449 Chronic obstructive pulmonary disease, unspecified: Secondary | ICD-10-CM | POA: Diagnosis not present

## 2022-05-19 DIAGNOSIS — I2510 Atherosclerotic heart disease of native coronary artery without angina pectoris: Secondary | ICD-10-CM | POA: Diagnosis not present

## 2022-05-19 DIAGNOSIS — I5032 Chronic diastolic (congestive) heart failure: Secondary | ICD-10-CM

## 2022-05-19 DIAGNOSIS — I10 Essential (primary) hypertension: Secondary | ICD-10-CM | POA: Diagnosis not present

## 2022-05-19 DIAGNOSIS — J9611 Chronic respiratory failure with hypoxia: Secondary | ICD-10-CM

## 2022-05-19 DIAGNOSIS — R002 Palpitations: Secondary | ICD-10-CM

## 2022-05-19 NOTE — Patient Instructions (Signed)
Medication Instructions:  No changes at this time.   *If you need a refill on your cardiac medications before your next appointment, please call your pharmacy*   Lab Work: None  If you have labs (blood work) drawn today and your tests are completely normal, you will receive your results only by: [place] (if you have MyChart) OR A paper copy in the mail If you have any lab test that is abnormal or we need to change your treatment, we will call you to review the results.   Testing/Procedures: Your physician has recommended that you wear a Zio monitor.   This monitor is a medical device that records the heart's electrical activity. Doctors most often use these monitors to diagnose arrhythmias. Arrhythmias are problems with the speed or rhythm of the heartbeat. The monitor is a small device applied to your chest. You can wear one while you do your normal daily activities. While wearing this monitor if you have any symptoms to push the button and record what you felt. Once you have worn this monitor for the period of time provider prescribed (Usually 14 days), you will return the monitor device in the postage paid box. Once it is returned they will download the data collected and provide [place] with a report which the provider will then review and we will call you with those results. Important tips:  Avoid showering during the first 24 hours of wearing the monitor. Avoid excessive sweating to help maximize wear time. Do not submerge the device, no hot tubs, and no swimming pools. Keep any lotions or oils away from the patch. After 24 hours you may shower with the patch on. Take brief showers with your back facing the shower head.  Do not remove patch once it has been placed because that will interrupt data and decrease adhesive wear time. Push the button when you have any symptoms and write down what you were feeling. Once you have completed wearing your monitor, remove and place into box  which has postage paid and place in your outgoing mailbox.  If for some reason you have misplaced your box then call our office and we can provide another box and/or mail it off for you.   Follow-Up: At [facility], you and your health needs are our priority.  As part of our continuing mission to provide you with exceptional heart care, we have created designated Provider Care Teams.  These Care Teams include your primary Cardiologist (physician) and Advanced Practice Providers (APPs -  Physician Assistants and Nurse Practitioners) who all work together to provide you with the care you need, when you need it.   Your next appointment:   6 week(s)  Provider:   Ida Rogue, MD or Christell Faith, PA-C

## 2022-05-24 DIAGNOSIS — J9611 Chronic respiratory failure with hypoxia: Secondary | ICD-10-CM

## 2022-05-24 DIAGNOSIS — I5032 Chronic diastolic (congestive) heart failure: Secondary | ICD-10-CM | POA: Diagnosis not present

## 2022-05-24 DIAGNOSIS — R002 Palpitations: Secondary | ICD-10-CM | POA: Diagnosis not present

## 2022-05-24 DIAGNOSIS — J449 Chronic obstructive pulmonary disease, unspecified: Secondary | ICD-10-CM | POA: Diagnosis not present

## 2022-05-24 DIAGNOSIS — I2510 Atherosclerotic heart disease of native coronary artery without angina pectoris: Secondary | ICD-10-CM | POA: Diagnosis not present

## 2022-06-01 ENCOUNTER — Encounter: Payer: Self-pay | Admitting: Physician Assistant

## 2022-06-15 DIAGNOSIS — I5032 Chronic diastolic (congestive) heart failure: Secondary | ICD-10-CM | POA: Diagnosis not present

## 2022-06-15 DIAGNOSIS — I2510 Atherosclerotic heart disease of native coronary artery without angina pectoris: Secondary | ICD-10-CM | POA: Diagnosis not present

## 2022-07-03 ENCOUNTER — Other Ambulatory Visit: Payer: Self-pay | Admitting: Acute Care

## 2022-07-03 DIAGNOSIS — Z87891 Personal history of nicotine dependence: Secondary | ICD-10-CM

## 2022-07-03 DIAGNOSIS — Z122 Encounter for screening for malignant neoplasm of respiratory organs: Secondary | ICD-10-CM

## 2022-07-03 NOTE — Progress Notes (Signed)
Cardiology Office Note:    Date:  07/04/2022   ID:  Kelly Hale, DOB August 02, 1955, MRN [id number]  PCP:  Harlan Stains, MD   [facility] Providers Cardiologist:  Ida Rogue, MD     Referring MD: Harlan Stains, MD   CC: follow up for PVCs  History of Present Illness:    Kelly Hale is a 67 y.o. female with a hx of coronary artery calcification noted on CT imaging, HFpEF, hypertension, COPD, asthma, OSA on CPAP, GERD, tobacco abuse.  She initially establish care with our practice in 2015 after recent hospitalization.  An echo at that time showed mild LVH, EF 60 to 65%.  Most recently evaluated by Christell Faith, PA on 05/19/2022, at that time she noted chronic stable dyspnea that was worse when not wearing her oxygen.  She also noted palpitations for several months that were more noticeable at nighttime or when laying down.  Lab work had recently been completed by PCP, overall unrevealing for sources of palpitation.  ZIO XT was ordered which showed predominant normal sinus rhythm, 1 run of SVT, no VE present.  Patient triggered events were associated with normal sinus rhythm.  She presents today for follow-up of her recent ZIO monitor.  Overall she has been doing stable from a cardiac perspective.  She is wearing oxygen 3 L/min 24 hours/day.  She is compliant with her CPAP.  She checks her blood pressure at home, this morning it was 120/75.  At her last visit in our office her blood pressure was well-controlled at 118/74.  It is elevated in the office today, which she states is normal for her and advised that she has whitecoat hypertension.  She denies chest pain, palpitations, dyspnea, pnd, orthopnea, n, v, dizziness, syncope, edema, weight gain, or early satiety.   Past Medical History:  Diagnosis Date   Anginal pain (HCC)    Aspirin allergy    Asthma    CHF (congestive heart failure) (HCC)    COPD (chronic obstructive pulmonary disease) (HCC)    oxygen at home as needed  (& at night)   Dyspnea    Hyperlipidemia    Morbid obesity ([place])    Tobacco abuse     Past Surgical History:  Procedure Laterality Date   CESAREAN SECTION     COLONOSCOPY WITH PROPOFOL N/A 11/04/2020   Procedure: COLONOSCOPY WITH PROPOFOL;  Surgeon: Robert Bellow, MD;  Location: [facility];  Service: Gastroenterology;  Laterality: N/A;   KNEE ARTHROSCOPY     right   TUBAL LIGATION      Current Medications: Current Meds  Medication Sig   albuterol (PROVENTIL HFA;VENTOLIN HFA) 108 (90 Base) MCG/ACT inhaler Inhale 1-2 puffs into the lungs every 6 (six) hours as needed for wheezing or shortness of breath.   albuterol (PROVENTIL) (2.5 MG/3ML) 0.083% nebulizer solution Take 3 mLs (2.5 mg total) by nebulization every 6 (six) hours as needed for wheezing or shortness of breath.   amLODipine (NORVASC) 5 MG tablet Take 1 tablet (5 mg total) by mouth daily.   aspirin 81 MG tablet Take 1 tablet (81 mg total) by mouth daily.   Cholecalciferol (VITAMIN D3) 1000 units CAPS Take 1,000 Units by mouth daily.   fluticasone furoate-vilanterol (BREO ELLIPTA) 200-25 MCG/ACT AEPB Inhale 1 puff into the lungs daily.   furosemide (LASIX) 20 MG tablet Take 1 tablet (20 mg total) as directed by mouth. 1 tablet daily and extra dose in PM as needed for swelling  Multiple Vitamin (MULTIVITAMIN) capsule Take 1 capsule by mouth daily.   OXYGEN Inhale 3 L into the lungs daily. 2 liters daily   rosuvastatin (CRESTOR) 10 MG tablet Take 1 tablet (10 mg total) by mouth daily.     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  Tobacco Use   Smoking  status: Former    Packs/day: 0.75    Years: 40.00    Additional pack years: 0.00    Total pack years: 30.00    Types: Cigarettes    Quit date: 07/16/2015    Years since quitting: 6.9   Smokeless tobacco: Never   Tobacco comments:    quit in Sept 2015, but started back smoking up to 2 cigs per day  Vaping Use   Vaping Use: Never used  Substance and Sexual Activity   Alcohol use: No    Alcohol/week: 0.0 standard drinks of alcohol   Drug use: No   Sexual activity: Not on file  Other Topics Concern   Not on file  Social History Narrative   Not on file   Social Determinants of Health   Financial Resource Strain: Not on file  Food Insecurity: Not on file  Transportation Needs: Not on file  Physical Activity: Not on file  Stress: Not on file  Social Connections: Not on file     Family History: The patient's family history includes Alzheimer's disease in her maternal grandmother; Asthma in her mother; Diabetes in her paternal grandfather; Hyperlipidemia in her maternal grandfather and mother; Hypertension in her maternal grandfather, maternal grandmother, and mother; Other in her father, maternal uncle, and mother; Stroke in her maternal grandfather.  ROS:   Please see the history of present illness.    All other systems reviewed and are negative.  EKGs/Labs/Other Studies Reviewed:    The following studies were reviewed today:  06/29/2022 ZIO monitor -predominantly sinus rhythm with average heart rate of 81 bpm.  1 run of SVT with maximum heart rate of 121 bpm.  5 patient triggered events that were associated with normal sinus rhythm.  02/19/2014 echo complete-mild LVH, EF 60 to 65%.  EKG:  EKG is not ordered today.    Recent Labs: No results found for requested labs within last 365 days.  Recent Lipid Panel    Component Value Date/Time   CHOL 128 12/24/2013 0508   TRIG 65 12/24/2013 0508   HDL 50 12/24/2013 0508   VLDL 13 12/24/2013 0508   LDLCALC 65 12/24/2013 0508      Risk Assessment/Calculations:                Physical Exam:    VS:  BP 120/74 Comment: at home  Pulse 88   Ht 5\' 7"  (1.702 m)   Wt 281 lb 12.8 oz (127.8 kg)   SpO2 99%   BMI 44.14 kg/m     Wt Readings from Last 3 Encounters:  07/04/22 281 lb 12.8 oz (127.8 kg)  05/19/22 279 lb (126.6 kg)  02/16/22 281 lb 3.2 oz (127.6 kg)     GEN:  Well nourished, well developed in no acute  distress HEENT: Normal NECK: No JVD; No carotid bruits LYMPHATICS: No lymphadenopathy CARDIAC: RRR, murmur appreciated 2/6 LSB, rubs, gallops RESPIRATORY:  Clear to auscultation without rales, wheezing or rhonchi  ABDOMEN: Soft, non-tender, non-distended MUSCULOSKELETAL:  No edema; No deformity  SKIN: Warm and dry NEUROLOGIC:  Alert and oriented x 3 PSYCHIATRIC:  Normal affect   ASSESSMENT:    1. Palpitations   2. Coronary artery disease involving native coronary artery of native heart without angina pectoris   3. Chronic heart failure with preserved ejection fraction ([place])   4. Essential hypertension   5. Chronic obstructive pulmonary disease, unspecified COPD type ([place])   6. Murmur, heart    PLAN:    In order of problems listed above:  Palpitations-ZIO XT revealed predominant normal sinus rhythm, 1 run of SVT, no VE present.  Patient triggered events were associated with normal sinus rhythm. She notes the PVCs mostly when she is bearing down to have a bowel movement. Based on the results of the monitor, she does not want to start a nodal blocking agent.   Coronary artery disease - coronary artery calcification noted on imaging. Stable with no anginal symptoms. No indication for ischemic evaluation.  Continue ASA, rosuvastatin.   HFpEF -echo in 2015 mild LVH, EF 60 to 65%. Takes lasix daily. Has not needed her additional PRN dose. Will repeat echo per new murmur below.   Hypertension - BP today is elevated in office at 151/94, home BP was 120/75 and recent OV BP was well controlled as  well. Continue current antihypertensive medication regimen.   Murmur - new murmur appreciated, denies any worsening respiratory symptoms, last echo was in 2015. Will repeat echo.   COPD - On O2 3 lpm continuously. Managed by pulmonology.   HLD - managed by PCP, recent LDL was 70, continue Crestor  OSA on CPAP - compliant, O2 HS.   Disposition - repeat echo for new murmur, return in 6 months.           Medication Adjustments/Labs and Tests Ordered: Current medicines are reviewed at length with the patient today.  Concerns regarding medicines are outlined above.  Orders Placed This Encounter  Procedures   ECHOCARDIOGRAM COMPLETE   No orders of the defined types were placed in this encounter.   Patient Instructions  Medication Instructions:  Your physician recommends that you continue on your current medications as directed. Please refer to the Current Medication list given to you today.  *If you need a refill on your cardiac medications before your next appointment, please call your pharmacy*  Lab Work: No labs ordered  If you have labs (blood work) drawn today and your tests are completely normal, you will receive your results only by: [place] (if you have MyChart) OR A paper copy in the mail If you have any lab test that is abnormal or we need to change your treatment, we will call you to review the results.   Testing/Procedures: Your physician has requested that you have an echocardiogram. Echocardiography is a painless test that uses sound waves to create images of your heart. It provides your doctor with information about the size and shape of your heart and how well your heart's chambers and valves are working. This procedure takes approximately one hour. There are no restrictions for this procedure. Please do NOT wear cologne, perfume, aftershave, or lotions (deodorant is allowed). Please arrive 15 minutes prior to your appointment time.  Follow-Up: At [facility], you and your  health needs are our priority.  As part of our continuing mission to provide you with exceptional heart care, we have created designated Provider Care Teams.  These Care Teams include your primary Cardiologist (physician) and Advanced Practice Providers (APPs -  Physician Assistants and Nurse Practitioners) who all work together to provide you with the care you need, when you need it.  We recommend signing up for the patient portal called "MyChart".  Sign up information is provided on this After Visit Summary.  MyChart is used to connect with patients for Virtual Visits (Telemedicine).  Patients are able to view lab/test results, encounter notes, upcoming appointments, etc.  Non-urgent messages can be sent to your provider as well.   To learn more about what you can do with MyChart, go to [email or web address].    Your next appointment:   6 month(s)  Provider:   Ida Rogue, MD    Signed, Trudi Ida, NP  07/04/2022 10:45 AM    [place]

## 2022-07-04 ENCOUNTER — Encounter: Payer: Self-pay | Admitting: Physician Assistant

## 2022-07-04 ENCOUNTER — Ambulatory Visit: Payer: Medicare HMO | Attending: Physician Assistant | Admitting: Cardiology

## 2022-07-04 VITALS — BP 120/74 | HR 88 | Ht 67.0 in | Wt 281.8 lb

## 2022-07-04 DIAGNOSIS — I5032 Chronic diastolic (congestive) heart failure: Secondary | ICD-10-CM | POA: Diagnosis not present

## 2022-07-04 DIAGNOSIS — R002 Palpitations: Secondary | ICD-10-CM

## 2022-07-04 DIAGNOSIS — I10 Essential (primary) hypertension: Secondary | ICD-10-CM | POA: Diagnosis not present

## 2022-07-04 DIAGNOSIS — I2510 Atherosclerotic heart disease of native coronary artery without angina pectoris: Secondary | ICD-10-CM

## 2022-07-04 DIAGNOSIS — J449 Chronic obstructive pulmonary disease, unspecified: Secondary | ICD-10-CM | POA: Diagnosis not present

## 2022-07-04 DIAGNOSIS — R011 Cardiac murmur, unspecified: Secondary | ICD-10-CM | POA: Diagnosis not present

## 2022-07-04 NOTE — Patient Instructions (Addendum)
Medication Instructions:  Your physician recommends that you continue on your current medications as directed. Please refer to the Current Medication list given to you today.  *If you need a refill on your cardiac medications before your next appointment, please call your pharmacy*  Lab Work: No labs ordered  If you have labs (blood work) drawn today and your tests are completely normal, you will receive your results only by: [place] (if you have MyChart) OR A paper copy in the mail If you have any lab test that is abnormal or we need to change your treatment, we will call you to review the results.   Testing/Procedures: Your physician has requested that you have an echocardiogram. Echocardiography is a painless test that uses sound waves to create images of your heart. It provides your doctor with information about the size and shape of your heart and how well your heart's chambers and valves are working. This procedure takes approximately one hour. There are no restrictions for this procedure. Please do NOT wear cologne, perfume, aftershave, or lotions (deodorant is allowed). Please arrive 15 minutes prior to your appointment time.  Follow-Up: At [facility], you and your health needs are our priority.  As part of our continuing mission to provide you with exceptional heart care, we have created designated Provider Care Teams.  These Care Teams include your primary Cardiologist (physician) and Advanced Practice Providers (APPs -  Physician Assistants and Nurse Practitioners) who all work together to provide you with the care you need, when you need it.  We recommend signing up for the patient portal called "MyChart".  Sign up information is provided on this After Visit Summary.  MyChart is used to connect with patients for Virtual Visits (Telemedicine).  Patients are able to view lab/test results, encounter notes, upcoming appointments, etc.  Non-urgent messages can be sent  to your provider as well.   To learn more about what you can do with MyChart, go to [email or web address].    Your next appointment:   6 month(s)  Provider:   Ida Rogue, MD

## 2022-07-06 ENCOUNTER — Encounter: Payer: Self-pay | Admitting: Cardiovascular Disease

## 2022-07-08 DIAGNOSIS — J449 Chronic obstructive pulmonary disease, unspecified: Secondary | ICD-10-CM | POA: Diagnosis not present

## 2022-07-08 DIAGNOSIS — E785 Hyperlipidemia, unspecified: Secondary | ICD-10-CM | POA: Diagnosis not present

## 2022-07-08 DIAGNOSIS — I5032 Chronic diastolic (congestive) heart failure: Secondary | ICD-10-CM | POA: Diagnosis not present

## 2022-07-08 DIAGNOSIS — I10 Essential (primary) hypertension: Secondary | ICD-10-CM | POA: Diagnosis not present

## 2022-07-13 DIAGNOSIS — E785 Hyperlipidemia, unspecified: Secondary | ICD-10-CM | POA: Diagnosis not present

## 2022-07-13 DIAGNOSIS — I5032 Chronic diastolic (congestive) heart failure: Secondary | ICD-10-CM | POA: Diagnosis not present

## 2022-07-13 DIAGNOSIS — J449 Chronic obstructive pulmonary disease, unspecified: Secondary | ICD-10-CM | POA: Diagnosis not present

## 2022-07-13 DIAGNOSIS — I10 Essential (primary) hypertension: Secondary | ICD-10-CM | POA: Diagnosis not present

## 2022-08-08 ENCOUNTER — Ambulatory Visit
Admission: RE | Admit: 2022-08-08 | Discharge: 2022-08-08 | Disposition: A | Discharge status: Home / Self Care | Payer: Medicare HMO | Source: Ambulatory Visit | Attending: Family Medicine | Admitting: Family Medicine

## 2022-08-08 DIAGNOSIS — Z122 Encounter for screening for malignant neoplasm of respiratory organs: Secondary | ICD-10-CM | POA: Diagnosis not present

## 2022-08-08 DIAGNOSIS — Z87891 Personal history of nicotine dependence: Secondary | ICD-10-CM | POA: Diagnosis not present

## 2022-08-09 ENCOUNTER — Ambulatory Visit: Payer: Medicare HMO | Attending: Physician Assistant

## 2022-08-09 DIAGNOSIS — R011 Cardiac murmur, unspecified: Secondary | ICD-10-CM

## 2022-08-09 LAB — ECHOCARDIOGRAM COMPLETE
AR max vel: 3.39 cm2
AV Area VTI: 3.54 cm2
AV Area mean vel: 3.2 cm2
AV Mean grad: 5 mmHg
AV Peak grad: 9.7 mmHg
Ao pk vel: 1.56 m/s
Area-P 1/2: 2.37 cm2
Calc EF: 59.9 %
S' Lateral: 2.9 cm
Single Plane A2C EF: 65.2 %
Single Plane A4C EF: 54.9 %

## 2022-08-11 ENCOUNTER — Other Ambulatory Visit: Payer: Self-pay | Admitting: Acute Care

## 2022-08-11 DIAGNOSIS — Z87891 Personal history of nicotine dependence: Secondary | ICD-10-CM

## 2022-08-11 DIAGNOSIS — Z122 Encounter for screening for malignant neoplasm of respiratory organs: Secondary | ICD-10-CM

## 2022-11-11 DIAGNOSIS — J9611 Chronic respiratory failure with hypoxia: Secondary | ICD-10-CM | POA: Diagnosis not present

## 2022-11-11 DIAGNOSIS — I110 Hypertensive heart disease with heart failure: Secondary | ICD-10-CM | POA: Diagnosis not present

## 2022-11-11 DIAGNOSIS — I5032 Chronic diastolic (congestive) heart failure: Secondary | ICD-10-CM | POA: Diagnosis not present

## 2022-11-11 DIAGNOSIS — I700 Atherosclerosis of aorta: Secondary | ICD-10-CM | POA: Diagnosis not present

## 2022-11-11 DIAGNOSIS — J449 Chronic obstructive pulmonary disease, unspecified: Secondary | ICD-10-CM | POA: Diagnosis not present

## 2022-11-11 DIAGNOSIS — E785 Hyperlipidemia, unspecified: Secondary | ICD-10-CM | POA: Diagnosis not present

## 2022-11-11 DIAGNOSIS — Z6841 Body Mass Index (BMI) 40.0 and over, adult: Secondary | ICD-10-CM | POA: Diagnosis not present

## 2023-01-02 NOTE — Progress Notes (Unsigned)
Cardiology Office Note  Date:  01/03/2023   ID:  Kelly Hale, DOB 26-Oct-1955, MRN [id number]  PCP:  Kelly Montana, MD   Chief Complaint  Patient presents with   Follow up Echo    "Doing well." Medications reviewed by the patient verbally.     HPI:  Kelly Hale is a 67 year old woman with  morbid obesity,  long smoking history, Quit 07/2015, severe COPD, hypertension,  obstructive sleep apnea on CPAP,  chronic diastolic CHF,  previous hospitalization 12/23/2013 at [facility] for acute respiratory failure with acute on chronic diastolic CHF.  She received antibiotics, prednisone taper, aggressive diuresis with improvement of her symptoms. She presents for routine follow-up of her chronic diastolic CHF  Last seen by myself in clinic February 2022 Seen by one of our providers March 2024  Continued chronic shortness of breath on chronic oxygen Recent flare in breathing, seem to come on with change in the weather  completed prednisone  Lab work reviewed Total chol 160, LDL 70  Continues on Lasix 20 mg daily Denies significant lower extremity edema  Rides exercise bike at home No regular walking program  Difficult to travel away from home, does not have portable generator Followed by pulmonary  Denies chest pain concerning for angina  On inhalers  EKG personally reviewed by myself on todays visit EKG Interpretation Date/Time:  Tuesday January 03 2023 13:37:49 EDT Ventricular Rate:  104 PR Interval:  198 QRS Duration:  112 QT Interval:  344 QTC Calculation: 452 R Axis:   9  Text Interpretation: Sinus tachycardia When compared with ECG of 07-Jul-2016 17:15, No significant change was found Confirmed by Kelly Hale [phone]) on 01/03/2023 1:42:14 PM   Other past medical history Echocardiogram results reviewed from 12/23/2013 showing normal ejection fraction, diastolic dysfunction, normal right heart size and function, normal right ventricular systolic pressure  PMH:    has a past medical history of Anginal pain (HCC), Aspirin allergy, Asthma, CHF (congestive heart failure) (HCC), COPD (chronic obstructive pulmonary disease) (HCC), Dyspnea, Hyperlipidemia, Morbid obesity (HCC), and Tobacco abuse.  PSH:    Past Surgical History:  Procedure Laterality Date   CESAREAN SECTION     COLONOSCOPY WITH PROPOFOL N/A 11/04/2020   Procedure: COLONOSCOPY WITH PROPOFOL;  Surgeon: Kelly Mayotte, MD;  Location: [facility] ENDOSCOPY;  Service: Gastroenterology;  Laterality: N/A;   KNEE ARTHROSCOPY     right   TUBAL LIGATION      Current Outpatient Medications  Medication Sig Dispense Refill   albuterol (PROVENTIL HFA;VENTOLIN HFA) 108 (90 Base) MCG/ACT inhaler Inhale 1-2 puffs into the lungs every 6 (six) hours as needed for wheezing or shortness of breath. 3 Inhaler 0   albuterol (PROVENTIL) (2.5 MG/3ML) 0.083% nebulizer solution Take 3 mLs (2.5 mg total) by nebulization every 6 (six) hours as needed for wheezing or shortness of breath. 300 mL 6   amLODipine (NORVASC) 5 MG tablet Take 1 tablet (5 mg total) by mouth daily. 90 tablet 3   aspirin 81 MG tablet Take 1 tablet (81 mg total) by mouth daily. 30 tablet 0   Cholecalciferol (VITAMIN D3) 1000 units CAPS Take 1,000 Units by mouth daily.     fluticasone furoate-vilanterol (BREO ELLIPTA) 200-25 MCG/ACT AEPB Inhale 1 puff into the lungs daily.     furosemide (LASIX) 20 MG tablet Take 1 tablet (20 mg total) as directed by mouth. 1 tablet daily and extra dose in PM as needed for swelling 180 tablet 3   Multiple Vitamin (MULTIVITAMIN) capsule Take  1 capsule by mouth daily.     OVER THE COUNTER MEDICATION Vinegar and Ginger   1 Capsule Daily     OXYGEN Inhale 3 L into the lungs daily. 2 liters daily     rosuvastatin (CRESTOR) 10 MG tablet Take 1 tablet (10 mg total) by mouth daily. 90 tablet 3   predniSONE (STERAPRED UNI-PAK 21 TAB) 10 MG (21) TBPK tablet Use as directed. (Patient not taking: Reported on 01/03/2023) 21 each 0    Tiotropium Bromide Monohydrate (SPIRIVA RESPIMAT) 2.5 MCG/ACT AERS Inhale 2 puffs into the lungs daily. (Patient not taking: Reported on 07/04/2022) 1 each 10   Tiotropium Bromide Monohydrate (SPIRIVA RESPIMAT) 2.5 MCG/ACT AERS Inhale 2 puffs into the lungs daily. (Patient not taking: Reported on 05/19/2022) 4 g 0   No current facility-administered medications for this visit.    Allergies:   Aspirin, Mometasone furo-formoterol fum, Montelukast, and Tiotropium bromide monohydrate   Social History:  The patient  reports that she quit smoking about 7 years ago. Her smoking use included cigarettes. She started smoking about 47 years ago. She has a 30 pack-year smoking history. She has never used smokeless tobacco. She reports that she does not drink alcohol and does not use drugs.   Family History:   family history includes Alzheimer's disease in her maternal grandmother; Asthma in her mother; Diabetes in her paternal grandfather; Hyperlipidemia in her maternal grandfather and mother; Hypertension in her maternal grandfather, maternal grandmother, and mother; Other in her father, maternal uncle, and mother; Stroke in her maternal grandfather.    Review of Systems: Review of Systems  Constitutional: Negative.   Respiratory:  Positive for shortness of breath.   Cardiovascular: Negative.   Gastrointestinal: Negative.   Musculoskeletal: Negative.   Neurological: Negative.   Psychiatric/Behavioral: Negative.    All other systems reviewed and are negative.   PHYSICAL EXAM: VS:  BP 110/72 (BP Location: Left Wrist, Patient Position: Sitting, Cuff Size: Normal)   Pulse (!) 104   Ht 5\' 7"  (1.702 m)   Wt 271 lb 6 oz (123.1 kg)   SpO2 96% Comment: 3 Liters of oxygen  BMI 42.50 kg/m  , BMI Body mass index is 42.5 kg/m. Constitutional:  oriented to person, place, and time. No distress.  HENT:  Head: Grossly normal Eyes:  no discharge. No scleral icterus.  Neck: No JVD, no carotid bruits   Cardiovascular: Regular rate and rhythm, no murmurs appreciated Pulmonary/Chest: Clear to auscultation bilaterally, no wheezes or rails Abdominal: Soft.  no distension.  no tenderness.  Musculoskeletal: Normal range of motion Neurological:  normal muscle tone. Coordination normal. No atrophy Skin: Skin warm and dry Psychiatric: normal affect, pleasant  Recent Labs: No results found for requested labs within last 365 days.    Lipid Panel Lab Results  Component Value Date   CHOL 128 12/24/2013   HDL 50 12/24/2013   LDLCALC 65 12/24/2013   TRIG 65 12/24/2013    Wt Readings from Last 3 Encounters:  01/03/23 271 lb 6 oz (123.1 kg)  07/04/22 281 lb 12.8 oz (127.8 kg)  05/19/22 279 lb (126.6 kg)     ASSESSMENT AND PLAN:  SOB (shortness of breath)/COPD Shortness of breath likely multifactorial Biggest contributor is her weight, deconditioning, COPD from long history of smoking Recommend she continue Lasix daily On chronic oxygen  Chest pain No recent symptoms of chest pain No further workup at this time  TOBACCO USER Stopped smoking April 2017 COPD, followed by pulmonary on chronic oxygen  Morbid obesity (HCC) Recommend low carbohydrate diet, exercise somewhat limited  OSA on CPAP - Plan: EKG 12-Lead CPAP at night  Chronic diastolic CHF (congestive heart failure) (HCC) - Plan: EKG 12-Lead Continue Lasix daily, lab work through primary care Recommending continued weight loss   Total encounter time more than 30 minutes  Greater than 50% was spent in counseling and coordination of care with the patient    Orders Placed This Encounter  Procedures   EKG 12-Lead     Signed, Dossie Arbour, M.D., Ph.D. 01/03/2023  [facility] [place], [place] [phone]

## 2023-01-03 ENCOUNTER — Ambulatory Visit: Payer: Medicare HMO | Attending: Cardiovascular Disease | Admitting: Cardiovascular Disease

## 2023-01-03 ENCOUNTER — Encounter: Payer: Self-pay | Admitting: Cardiovascular Disease

## 2023-01-03 VITALS — BP 110/72 | HR 104 | Ht 67.0 in | Wt 271.4 lb

## 2023-01-03 DIAGNOSIS — J432 Centrilobular emphysema: Secondary | ICD-10-CM | POA: Diagnosis not present

## 2023-01-03 DIAGNOSIS — I2510 Atherosclerotic heart disease of native coronary artery without angina pectoris: Secondary | ICD-10-CM | POA: Diagnosis not present

## 2023-01-03 DIAGNOSIS — J449 Chronic obstructive pulmonary disease, unspecified: Secondary | ICD-10-CM | POA: Diagnosis not present

## 2023-01-03 DIAGNOSIS — I5032 Chronic diastolic (congestive) heart failure: Secondary | ICD-10-CM

## 2023-01-03 DIAGNOSIS — R002 Palpitations: Secondary | ICD-10-CM | POA: Diagnosis not present

## 2023-01-03 DIAGNOSIS — J9611 Chronic respiratory failure with hypoxia: Secondary | ICD-10-CM | POA: Diagnosis not present

## 2023-01-03 NOTE — Patient Instructions (Signed)

## 2023-02-16 ENCOUNTER — Telehealth: Payer: Medicare HMO | Admitting: Adult Health

## 2023-02-16 DIAGNOSIS — J9611 Chronic respiratory failure with hypoxia: Secondary | ICD-10-CM

## 2023-02-16 DIAGNOSIS — J449 Chronic obstructive pulmonary disease, unspecified: Secondary | ICD-10-CM

## 2023-02-16 DIAGNOSIS — G4733 Obstructive sleep apnea (adult) (pediatric): Secondary | ICD-10-CM | POA: Diagnosis not present

## 2023-02-16 MED ORDER — PREDNISONE 20 MG PO TABS: 20.0000 mg | ORAL_TABLET | Freq: Every day | ORAL | 0 refills | Status: DC

## 2023-02-16 NOTE — Progress Notes (Signed)
Virtual Visit via Video Note  I connected with Kelly Hale on 02/16/23 at 10:00 AM EST by a video enabled telemedicine application and verified that I am speaking with the correct person using two identifiers.  Location: Patient: Home  Provider: Office    I discussed the limitations of evaluation and management by telemedicine and the availability of in person appointments. The patient expressed understanding and agreed to proceed.  Discussed the use of AI scribe software for clinical note transcription with the patient, who gave verbal consent to proceed.  History of Present Illness: 67 year old female former smoker (30-pack-year history) followed for COPD, Chronic Respiratory failure  and obstructive sleep apnea  Today's video visit is a 1 year follow-up.  Patient is followed for COPD and chronic respiratory failure she also has obstructive sleep apnea on nocturnal CPAP. She is on oxygen 3l/m .  Reports a recent increase in wheezing and coughing, which she attributes to a change in weather. She describes her COPD as "about the same," but notes that the wheezing and coughing are more frequent than usual. She is currently using BREO daily . Uses albuterol inhaler, and an albuterol nebulizer for management as needed.  She denies any discolored sputum or fever.   She maintained on oxygen 3 L.    She has obstructive sleep apnea is on CPAP machine at nighttime.  She uses a fullface mask.  Says she uses it every single night cannot sleep without it.  She feels he benefits from CPAP.  CPAP download shows excellent compliance with 100% usage.  Daily average usage at 8 hours.  Patient is on CPAP 16 cm H2O.  AHI 0.2/hour.    She has been participating in a lung cancer screening program, with her last CT scan in April showing stable scarring and COPD. Small nodules are being monitored, but have shown no changes.   She received her flu shot last month and her COVID vaccine. She is unsure about her  pneumonia vaccine status and her TDAP is overdue according to the records. She does not recall receiving the RSV vaccine.   Past Medical History:  Diagnosis Date   Anginal pain (HCC)    Aspirin allergy    Asthma    CHF (congestive heart failure) (HCC)    COPD (chronic obstructive pulmonary disease) (HCC)    oxygen at home as needed (& at night)   Dyspnea    Hyperlipidemia    Morbid obesity (HCC)    Tobacco abuse    Current Outpatient Medications on File Prior to Visit  Medication Sig Dispense Refill   albuterol (PROVENTIL HFA;VENTOLIN HFA) 108 (90 Base) MCG/ACT inhaler Inhale 1-2 puffs into the lungs every 6 (six) hours as needed for wheezing or shortness of breath. 3 Inhaler 0   albuterol (PROVENTIL) (2.5 MG/3ML) 0.083% nebulizer solution Take 3 mLs (2.5 mg total) by nebulization every 6 (six) hours as needed for wheezing or shortness of breath. 300 mL 6   amLODipine (NORVASC) 5 MG tablet Take 1 tablet (5 mg total) by mouth daily. 90 tablet 3   aspirin 81 MG tablet Take 1 tablet (81 mg total) by mouth daily. 30 tablet 0   Cholecalciferol (VITAMIN D3) 1000 units CAPS Take 1,000 Units by mouth daily.     fluticasone furoate-vilanterol (BREO ELLIPTA) 200-25 MCG/ACT AEPB Inhale 1 puff into the lungs daily.     furosemide (LASIX) 20 MG tablet Take 1 tablet (20 mg total) as directed by mouth. 1 tablet daily and  extra dose in PM as needed for swelling 180 tablet 3   Multiple Vitamin (MULTIVITAMIN) capsule Take 1 capsule by mouth daily.     OVER THE COUNTER MEDICATION Vinegar and Ginger   1 Capsule Daily     OXYGEN Inhale 3 L into the lungs daily. 2 liters daily     rosuvastatin (CRESTOR) 10 MG tablet Take 1 tablet (10 mg total) by mouth daily. 90 tablet 3   No current facility-administered medications on file prior to visit.           Observations/Objective: PFTs 10/01/13: FVC:  2.19 L ( 60 %pred), FEV1:  1.50 L ( 53 %pred), FEV1/FVC: 69%, TLC:  4.94 L ( 92 %pred), DLCO  99 %pred PSG  04/11/14: AHI 64/hr. CPAP recommendation 16 cm H2O  Assessment and Plan: Assessment and Plan    COPD Exacerbation   Increased wheezing and coughing suggest a mild COPD exacerbation likely triggered by weather changes. There is no discolored sputum or fever, indicating no infection. Prednisone has been effective in past exacerbations. We will prescribe prednisone 20 mg daily for 5 days and recommend using an albuterol nebulizer 1-2 times daily.  Continue on Breo daily.  Albuterol inhaler or nebulizer as needed. Please contact office for sooner follow up if symptoms do not improve or worsen or seek emergency care    Obstructive Sleep Apnea   Excellent control and compliance on CPAP.  Continue on current settings.  CPAP care discussed in detail.   General Health Maintenance   She is up to date on flu and COVID  We discussed RSV and pneumonia vaccine.  She is discussed with her primary care provider  Follow-up   Follow-up in 6 months and as needed.   Follow Up Instructions:    I discussed the assessment and treatment plan with the patient. The patient was provided an opportunity to ask questions and all were answered. The patient agreed with the plan and demonstrated an understanding of the instructions.   The patient was advised to call back or seek an in-person evaluation if the symptoms worsen or if the condition fails to improve as anticipated.  I provided 30  minutes of non-face-to-face time during this encounter.   Rubye Oaks, NP

## 2023-02-16 NOTE — Patient Instructions (Signed)
Continue on CPAP At bedtime   Wear all night long .  Work on healthy weight loss.  Do not drive if sleepy    Continue on BREO daily , rinse after use.  Prednisone 20mg  daily for 1 week.  Claritin 10mg  daily As needed  drainage  Saline nasal rinses As needed   Continue on Oxygen 3l/m   Follow up with Dr. Belia Heman in 6 months and As needed   Please contact office for sooner follow up if symptoms do not improve or worsen or seek emergency care

## 2023-02-20 DIAGNOSIS — Z1231 Encounter for screening mammogram for malignant neoplasm of breast: Secondary | ICD-10-CM | POA: Diagnosis not present

## 2023-03-23 DIAGNOSIS — H40033 Anatomical narrow angle, bilateral: Secondary | ICD-10-CM | POA: Diagnosis not present

## 2023-03-23 DIAGNOSIS — H2513 Age-related nuclear cataract, bilateral: Secondary | ICD-10-CM | POA: Diagnosis not present

## 2023-03-27 DIAGNOSIS — Z0100 Encounter for examination of eyes and vision without abnormal findings: Secondary | ICD-10-CM | POA: Diagnosis not present

## 2023-06-26 DIAGNOSIS — M8588 Other specified disorders of bone density and structure, other site: Secondary | ICD-10-CM | POA: Diagnosis not present

## 2023-06-26 DIAGNOSIS — I5032 Chronic diastolic (congestive) heart failure: Secondary | ICD-10-CM | POA: Diagnosis not present

## 2023-06-26 DIAGNOSIS — Z0000 Encounter for general adult medical examination without abnormal findings: Secondary | ICD-10-CM | POA: Diagnosis not present

## 2023-06-26 DIAGNOSIS — I10 Essential (primary) hypertension: Secondary | ICD-10-CM | POA: Diagnosis not present

## 2023-06-26 DIAGNOSIS — E559 Vitamin D deficiency, unspecified: Secondary | ICD-10-CM | POA: Diagnosis not present

## 2023-06-26 DIAGNOSIS — I700 Atherosclerosis of aorta: Secondary | ICD-10-CM | POA: Diagnosis not present

## 2023-06-26 DIAGNOSIS — J4530 Mild persistent asthma, uncomplicated: Secondary | ICD-10-CM | POA: Diagnosis not present

## 2023-06-26 DIAGNOSIS — J9611 Chronic respiratory failure with hypoxia: Secondary | ICD-10-CM | POA: Diagnosis not present

## 2023-06-26 DIAGNOSIS — J449 Chronic obstructive pulmonary disease, unspecified: Secondary | ICD-10-CM | POA: Diagnosis not present

## 2023-06-26 DIAGNOSIS — E785 Hyperlipidemia, unspecified: Secondary | ICD-10-CM | POA: Diagnosis not present

## 2023-08-07 ENCOUNTER — Other Ambulatory Visit: Payer: Self-pay | Admitting: Acute Care

## 2023-08-07 DIAGNOSIS — Z122 Encounter for screening for malignant neoplasm of respiratory organs: Secondary | ICD-10-CM

## 2023-08-07 DIAGNOSIS — Z87891 Personal history of nicotine dependence: Secondary | ICD-10-CM

## 2023-08-14 DIAGNOSIS — M25561 Pain in right knee: Secondary | ICD-10-CM | POA: Diagnosis not present

## 2023-08-15 ENCOUNTER — Ambulatory Visit
Admission: RE | Admit: 2023-08-15 | Discharge: 2023-08-15 | Disposition: A | Discharge status: Home / Self Care | Source: Ambulatory Visit | Attending: Acute Care | Admitting: Acute Care

## 2023-08-15 DIAGNOSIS — Z122 Encounter for screening for malignant neoplasm of respiratory organs: Secondary | ICD-10-CM | POA: Diagnosis not present

## 2023-08-15 DIAGNOSIS — Z87891 Personal history of nicotine dependence: Secondary | ICD-10-CM | POA: Diagnosis not present

## 2023-09-08 ENCOUNTER — Other Ambulatory Visit: Payer: Self-pay

## 2023-09-08 DIAGNOSIS — Z87891 Personal history of nicotine dependence: Secondary | ICD-10-CM

## 2023-09-08 DIAGNOSIS — Z122 Encounter for screening for malignant neoplasm of respiratory organs: Secondary | ICD-10-CM

## 2023-12-25 DIAGNOSIS — E785 Hyperlipidemia, unspecified: Secondary | ICD-10-CM | POA: Diagnosis not present

## 2023-12-25 DIAGNOSIS — I10 Essential (primary) hypertension: Secondary | ICD-10-CM | POA: Diagnosis not present

## 2023-12-25 DIAGNOSIS — I700 Atherosclerosis of aorta: Secondary | ICD-10-CM | POA: Diagnosis not present

## 2023-12-25 DIAGNOSIS — J9611 Chronic respiratory failure with hypoxia: Secondary | ICD-10-CM | POA: Diagnosis not present

## 2023-12-25 DIAGNOSIS — I5032 Chronic diastolic (congestive) heart failure: Secondary | ICD-10-CM | POA: Diagnosis not present

## 2023-12-25 DIAGNOSIS — J449 Chronic obstructive pulmonary disease, unspecified: Secondary | ICD-10-CM | POA: Diagnosis not present

## 2023-12-25 DIAGNOSIS — G4730 Sleep apnea, unspecified: Secondary | ICD-10-CM | POA: Diagnosis not present

## 2023-12-25 DIAGNOSIS — Z23 Encounter for immunization: Secondary | ICD-10-CM | POA: Diagnosis not present

## 2024-02-26 DIAGNOSIS — M85852 Other specified disorders of bone density and structure, left thigh: Secondary | ICD-10-CM | POA: Diagnosis not present

## 2024-02-26 DIAGNOSIS — Z1231 Encounter for screening mammogram for malignant neoplasm of breast: Secondary | ICD-10-CM | POA: Diagnosis not present

## 2024-02-26 DIAGNOSIS — M8588 Other specified disorders of bone density and structure, other site: Secondary | ICD-10-CM | POA: Diagnosis not present

## 2024-02-28 DIAGNOSIS — J441 Chronic obstructive pulmonary disease with (acute) exacerbation: Secondary | ICD-10-CM | POA: Diagnosis not present

## 2024-02-28 DIAGNOSIS — N6314 Unspecified lump in the right breast, lower inner quadrant: Secondary | ICD-10-CM | POA: Diagnosis not present

## 2024-03-05 ENCOUNTER — Other Ambulatory Visit: Payer: Self-pay | Admitting: Family Medicine

## 2024-03-05 DIAGNOSIS — N6314 Unspecified lump in the right breast, lower inner quadrant: Secondary | ICD-10-CM

## 2024-03-21 ENCOUNTER — Encounter: Payer: Self-pay | Admitting: Internal Medicine

## 2024-03-21 ENCOUNTER — Ambulatory Visit: Admitting: Internal Medicine

## 2024-03-21 VITALS — BP 120/80 | HR 107 | Ht 66.0 in | Wt 268.0 lb

## 2024-03-21 DIAGNOSIS — Z6841 Body Mass Index (BMI) 40.0 and over, adult: Secondary | ICD-10-CM | POA: Diagnosis not present

## 2024-03-21 DIAGNOSIS — Z87891 Personal history of nicotine dependence: Secondary | ICD-10-CM | POA: Diagnosis not present

## 2024-03-21 DIAGNOSIS — J9611 Chronic respiratory failure with hypoxia: Secondary | ICD-10-CM

## 2024-03-21 DIAGNOSIS — E669 Obesity, unspecified: Secondary | ICD-10-CM | POA: Diagnosis not present

## 2024-03-21 DIAGNOSIS — J439 Emphysema, unspecified: Secondary | ICD-10-CM | POA: Diagnosis not present

## 2024-03-21 DIAGNOSIS — G4730 Sleep apnea, unspecified: Secondary | ICD-10-CM

## 2024-03-21 DIAGNOSIS — J432 Centrilobular emphysema: Secondary | ICD-10-CM

## 2024-03-21 DIAGNOSIS — J0110 Acute frontal sinusitis, unspecified: Secondary | ICD-10-CM

## 2024-03-21 DIAGNOSIS — J449 Chronic obstructive pulmonary disease, unspecified: Secondary | ICD-10-CM

## 2024-03-21 MED ORDER — ALBUTEROL SULFATE (2.5 MG/3ML) 0.083% IN NEBU: 2.5000 mg | INHALATION_SOLUTION | Freq: Four times a day (QID) | RESPIRATORY_TRACT | 6 refills | Status: AC | PRN

## 2024-03-21 MED ORDER — AMOXICILLIN-POT CLAVULANATE 500-125 MG PO TABS: 1.0000 | ORAL_TABLET | Freq: Two times a day (BID) | ORAL | 0 refills | Status: AC

## 2024-03-21 NOTE — Patient Instructions (Signed)
 Continue inhalers as prescribed with Breo Albuterol  as needed Recommend obtaining CPAP download via SD card when possible Start Augmentin  875 mg twice a day for sinus infection Continue oxygen  as prescribed

## 2024-03-21 NOTE — Progress Notes (Signed)
 SYNOPSIS 68 year old female, former smoker quit in 2017 (30-pack-year history). Past medical history significant for obstructive sleep apnea, moderate COPD. Former patient of Dr. Linard, last seen for televisit in June 2020. PSG in 2016 showed an AHI of 64/hr- CPAP at 16 cm H2O. Maintained on Breo and as needed albuterol . O2 dependent.  DATA: PFTs 10/01/13: FVC:  2.19 L ( 60 %pred), FEV1:  1.50 L ( 53 %pred), FEV1/FVC: 69%, TLC:  4.94 L ( 92 %pred), DLCO  99 %pred PSG 04/11/14: AHI 64/hr. CPAP recommendation 16 cm H2O Compliance 03/26 - 08/02/16: 30/30 days, 30 days > 4 hrs.  Compliance 04/19 - 08/26/17: 30/30 days, 30 days > 4 hrs.  09/27/17: 108 meters. Desaturated to 86% on RA, 88% on 2 LPM CPAP compliance 10/21-11/19/19: 30/30 nights. CPAP 16 cm H2O, Mean AHI 0.3      CC  Follow-up assessment for OSA Follow-up assessment for COPD Follow-up chronic hypoxic respiratory failure Follow-up low-dose CT chest   HPI Follow-up severe OSA Previous compliance report showed excellent compliance however patient has old machine with SD card will need current download for further evaluation and assessment  Mild COPD exacerbation at this time Completed to 2-week course of prednisone  Patient having sinus pain and tenderness will prescribe Augmentin  875 twice a day for sinus infection Patient states that [place] is helping   Chronic Hypoxic resp failure due to COPD -Patient benefits from oxygen  therapy 2L [place]  -recommend using oxygen  as prescribed -patient needs this for survival   Low-dose CT chest reviewed in detail May 2025 CT scan shows no significant finding         Allergies  Allergen Reactions   Aspirin  Other (See Comments)    REACTION: intol-asthma Patient takes low dose of aspirin .   Mometasone  Furo-Formoterol  Fum Other (See Comments)    Other reaction(s): Unknown   Montelukast Other (See Comments)    Other reaction(s): Unknown   Tiotropium Bromide      Other reaction(s): Unknown    Immunization History  Administered Date(s) Administered   Fluad Trivalent(High Dose 65+) 01/16/2023   Influenza Split 04/20/2015   Influenza,inj,Quad PF,6+ Mos 12/27/2013, 02/12/2016, 03/02/2018   Influenza-Unspecified 12/16/2019, 02/09/2022   PFIZER(Purple Top)SARS-COV-2 Vaccination 07/05/2019, 07/26/2019, 02/08/2020   Pfizer(Comirnaty)Fall Seasonal Vaccine 12 years and older 01/16/2023   Pneumococcal Conjugate-13 05/05/2009   Pneumococcal-Unspecified 01/09/2013   Tdap 11/08/2005    Past Medical History:  Diagnosis Date   Anginal pain    Aspirin  allergy    Asthma    CHF (congestive heart failure) (HCC)    COPD (chronic obstructive pulmonary disease) (HCC)    oxygen  at home as needed (& at night)   Dyspnea    Hyperlipidemia    Morbid obesity (HCC)    Tobacco abuse     Tobacco History: Social History   Tobacco Use  Smoking Status Former   Current packs/day: 0.00   Average packs/day: 0.8 packs/day for 40.0 years (30.0 ttl pk-yrs)   Types: Cigarettes   Start date: 07/16/1975   Quit date: 07/16/2015   Years since quitting: 8.6  Smokeless Tobacco Never  Tobacco Comments   quit in Sept 2015, but started back smoking up to 2 cigs per day   Counseling given: Not Answered Tobacco comments: quit in Sept 2015, but started back smoking up to 2 cigs per day   Outpatient Medications Prior to Visit  Medication Sig Dispense Refill   ZEPBOUND 2.5 MG/0.5ML Pen Inject 2.5 mg into the skin once a  week.     albuterol  (PROVENTIL  HFA;VENTOLIN  HFA) 108 (90 Base) MCG/ACT inhaler Inhale 1-2 puffs into the lungs every 6 (six) hours as needed for wheezing or shortness of breath. 3 Inhaler 0   albuterol  (PROVENTIL ) (2.5 MG/3ML) 0.083% nebulizer solution Take 3 mLs (2.5 mg total) by nebulization every 6 (six) hours as needed for wheezing or shortness of breath. 300 mL 6   amLODipine  (NORVASC ) 5 MG tablet Take 1 tablet (5 mg total) by mouth daily. 90 tablet 3    aspirin  81 MG tablet Take 1 tablet (81 mg total) by mouth daily. 30 tablet 0   Cholecalciferol (VITAMIN D3) 1000 units CAPS Take 1,000 Units by mouth daily.     fluticasone  furoate-vilanterol (BREO ELLIPTA ) 200-25 MCG/ACT AEPB Inhale 1 puff into the lungs daily.     furosemide  (LASIX ) 20 MG tablet Take 1 tablet (20 mg total) as directed by mouth. 1 tablet daily and extra dose in PM as needed for swelling 180 tablet 3   Multiple Vitamin (MULTIVITAMIN) capsule Take 1 capsule by mouth daily.     OXYGEN  Inhale 3 L into the lungs daily. 2 liters daily     rosuvastatin  (CRESTOR ) 10 MG tablet Take 1 tablet (10 mg total) by mouth daily. 90 tablet 3   OVER THE COUNTER MEDICATION Vinegar and Ginger   1 Capsule Daily     predniSONE  (DELTASONE ) 20 MG tablet Take 1 tablet (20 mg total) by mouth daily with breakfast. 5 tablet 0   predniSONE  (DELTASONE ) 20 MG tablet Take 1 tablet (20 mg total) by mouth daily with breakfast. 5 tablet 0   No facility-administered medications prior to visit.   BP 120/80   Pulse (!) 107   Ht 5' 6 (1.676 m)   Wt 268 lb (121.6 kg)   SpO2 93%   BMI 43.26 kg/m    Alert awake on oxygen  Lungs are clear to auscultation no wheezing +sinus tenderness maxillary S1-S2 no murmurs Pulses intact No peripheral edema No focal deficits    ASSESSMENT AND PLAN  68 year old white female morbidly obese with severe underlying sleep apnea in the setting of moderate COPD with emphysema and chronic hypoxic respiratory failure with poor respiratory insufficiency and very deconditioned state   COPD with emphysema Moderate obstructive lung disease with FEV1 58% predicted Continue Breo 200 as prescribed  Working well for her Use albuterol  as needed and nebs as needed Patient with a history of COPD exacerbation in the last 2 weeks Patient does have sinus pain and tenderness Will prescribe Augmentin  875 twice a day for sinus infection   Sleep apnea on CPAP Severe with AHI of  64 Recommend bringing SD card for download further evaluate Continue as prescribed  LUNG CANCER SCREENING PROTOCOL Previous scan 07/2021 No abnormal nodules/masses Follow up protocol LDCT 08/2023 No pneumothorax. No pleural effusion. Mild centrilobular emphysema with diffuse bronchial wall thickening. No acute consolidative airspace disease or lung masses. No significant growth of previously visualized pulmonary nodules up to 7.5 mm in the peripheral right lower lobe . No new significant pulmonary nodules.  Obesity -recommend significant weight loss -recommend changing diet  Deconditioned state -Recommend increased daily activity and exercise     MEDICATION ADJUSTMENTS/LABS AND TESTS ORDERED: CONTINUE CPAP as prescribed-download Continue inhalers as prescribed-continue Breo Continue Oxygen  as prescribed AVOID second hand smoke! Follow-up lung cancer screening protocol      CURRENT MEDICATIONS REVIEWED AT LENGTH WITH PATIENT TODAY   Patient  satisfied with Plan of action and management.  All questions answered   Follow up 6 months   I spent a total of 41 minutes dedicated to the care of this patient on the date of this encounter to include pre-visit review of records, face-to-face time with the patient discussing conditions above, post visit ordering of testing, clinical documentation with the electronic health record, making appropriate referrals as documented, and communicating necessary information to the patient's healthcare team.    The Patient requires high complexity decision making for assessment and support, frequent evaluation and titration of therapies, application of advanced monitoring technologies and extensive interpretation of multiple databases.  Patient satisfied with Plan of action and management. All questions answered    Nickolas Alm Cellar, M.D.  [facility] Pulmonary & Critical Care Medicine  Medical Director [facility]

## 2024-04-16 ENCOUNTER — Ambulatory Visit
Admission: RE | Admit: 2024-04-16 | Discharge: 2024-04-16 | Disposition: A | Discharge status: Home / Self Care | Source: Ambulatory Visit | Attending: Family Medicine | Admitting: Family Medicine

## 2024-04-16 DIAGNOSIS — N6314 Unspecified lump in the right breast, lower inner quadrant: Secondary | ICD-10-CM

## 2024-05-07 ENCOUNTER — Ambulatory Visit: Admitting: Cardiovascular Disease

## 2024-05-12 NOTE — Progress Notes (Unsigned)
 Cardiology Office Note  Date:  05/12/2024   ID:  Kelly Hale, DOB 1956-01-13, MRN [id number]  PCP:  Kelly Channel, MD   No chief complaint on file.   HPI:  Kelly Hale is a 69 year old woman with  morbid obesity,  long smoking history, Quit 07/2015, severe COPD, hypertension,  obstructive sleep apnea on CPAP,  chronic diastolic CHF,  previous hospitalization 12/23/2013 at [facility] for acute respiratory failure with acute on chronic diastolic CHF.  She received antibiotics, prednisone  taper, aggressive diuresis with improvement of her symptoms. She presents for routine follow-up of her chronic diastolic CHF  Last seen by myself in clinic 9/24  Scheduled for colonoscopy   Continued chronic shortness of breath on chronic oxygen  Recent flare in breathing, seem to come on with change in the weather  completed prednisone   Lab work reviewed Total chol 160, LDL 70  Continues on Lasix  20 mg daily Denies significant lower extremity edema  Rides exercise bike at home No regular walking program  Difficult to travel away from home, does not have portable generator Followed by pulmonary  Denies chest pain concerning for angina  On inhalers  EKG personally reviewed by myself on todays visit     Other past medical history Echocardiogram results reviewed from 12/23/2013 showing normal ejection fraction, diastolic dysfunction, normal right heart size and function, normal right ventricular systolic pressure  PMH:   has a past medical history of Anginal pain, Aspirin  allergy, Asthma, CHF (congestive heart failure) (HCC), COPD (chronic obstructive pulmonary disease) (HCC), Dyspnea, Hyperlipidemia, Morbid obesity (HCC), and Tobacco abuse.  PSH:    Past Surgical History:  Procedure Laterality Date   CESAREAN SECTION     COLONOSCOPY WITH PROPOFOL  N/A 11/04/2020   Procedure: COLONOSCOPY WITH PROPOFOL ;  Surgeon: Kelly Hale ORN, MD;  Location: [facility] ENDOSCOPY;  Service: Gastroenterology;   Laterality: N/A;   KNEE ARTHROSCOPY     right   TUBAL LIGATION      Current Outpatient Medications  Medication Sig Dispense Refill   albuterol  (PROVENTIL  HFA;VENTOLIN  HFA) 108 (90 Base) MCG/ACT inhaler Inhale 1-2 puffs into the lungs every 6 (six) hours as needed for wheezing or shortness of breath. 3 Inhaler 0   albuterol  (PROVENTIL ) (2.5 MG/3ML) 0.083% nebulizer solution Take 3 mLs (2.5 mg total) by nebulization every 6 (six) hours as needed for wheezing or shortness of breath. 300 mL 6   amLODipine  (NORVASC ) 5 MG tablet Take 1 tablet (5 mg total) by mouth daily. 90 tablet 3   aspirin  81 MG tablet Take 1 tablet (81 mg total) by mouth daily. 30 tablet 0   Cholecalciferol (VITAMIN D3) 1000 units CAPS Take 1,000 Units by mouth daily.     fluticasone  furoate-vilanterol (BREO ELLIPTA ) 200-25 MCG/ACT AEPB Inhale 1 puff into the lungs daily.     furosemide  (LASIX ) 20 MG tablet Take 1 tablet (20 mg total) as directed by mouth. 1 tablet daily and extra dose in PM as needed for swelling 180 tablet 3   Multiple Vitamin (MULTIVITAMIN) capsule Take 1 capsule by mouth daily.     OXYGEN  Inhale 3 L into the lungs daily. 2 liters daily     rosuvastatin  (CRESTOR ) 10 MG tablet Take 1 tablet (10 mg total) by mouth daily. 90 tablet 3   ZEPBOUND 2.5 MG/0.5ML Pen Inject 2.5 mg into the skin once a week.     No current facility-administered medications for this visit.    Allergies:   Aspirin , Mometasone  furo-formoterol  fum, Montelukast, and Tiotropium bromide  Social History:  The patient  reports that she quit smoking about 8 years ago. Her smoking use included cigarettes. She started smoking about 48 years ago. She has a 30 pack-year smoking history. She has never used smokeless tobacco. She reports that she does not drink alcohol and does not use drugs.   Family History:   family history includes Alzheimer's disease in her maternal grandmother; Asthma in her mother; Diabetes in her paternal grandfather;  Hyperlipidemia in her maternal grandfather and mother; Hypertension in her maternal grandfather, maternal grandmother, and mother; Other in her father, maternal uncle, and mother; Stroke in her maternal grandfather.    Review of Systems: Review of Systems  Constitutional: Negative.   Respiratory:  Positive for shortness of breath.   Cardiovascular: Negative.   Gastrointestinal: Negative.   Musculoskeletal: Negative.   Neurological: Negative.   Psychiatric/Behavioral: Negative.    All other systems reviewed and are negative.   PHYSICAL EXAM: VS:  There were no vitals taken for this visit. , BMI There is no height or weight on file to calculate BMI. Constitutional:  oriented to person, place, and time. No distress.  HENT:  Head: Grossly normal Eyes:  no discharge. No scleral icterus.  Neck: No JVD, no carotid bruits  Cardiovascular: Regular rate and rhythm, no murmurs appreciated Pulmonary/Chest: Clear to auscultation bilaterally, no wheezes or rails Abdominal: Soft.  no distension.  no tenderness.  Musculoskeletal: Normal range of motion Neurological:  normal muscle tone. Coordination normal. No atrophy Skin: Skin warm and dry Psychiatric: normal affect, pleasant  Recent Labs: No results found for requested labs within last 365 days.    Lipid Panel Lab Results  Component Value Date   CHOL 128 12/24/2013   HDL 50 12/24/2013   LDLCALC 65 12/24/2013   TRIG 65 12/24/2013    Wt Readings from Last 3 Encounters:  03/21/24 268 lb (121.6 kg)  01/03/23 271 lb 6 oz (123.1 kg)  07/04/22 281 lb 12.8 oz (127.8 kg)     ASSESSMENT AND PLAN:  SOB (shortness of breath)/COPD Shortness of breath likely multifactorial Biggest contributor is her weight, deconditioning, COPD from long history of smoking Recommend she continue Lasix  daily On chronic oxygen   Chest pain No recent symptoms of chest pain No further workup at this time  TOBACCO USER Stopped smoking April 2017 COPD,  followed by pulmonary on chronic oxygen   Morbid obesity (HCC) Recommend low carbohydrate diet, exercise somewhat limited  OSA on CPAP - Plan: EKG 12-Lead CPAP at night  Chronic diastolic CHF (congestive heart failure) (HCC) - Plan: EKG 12-Lead Continue Lasix  daily, lab work through primary care Recommending continued weight loss   Total encounter time more than 30 minutes  Greater than 50% was spent in counseling and coordination of care with the patient    No orders of the defined types were placed in this encounter.    Signed, Velinda Lunger, M.D., Ph.D. 05/12/2024  [facility] [place], [place] [phone]

## 2024-05-13 ENCOUNTER — Ambulatory Visit: Admitting: Cardiovascular Disease

## 2024-05-13 DIAGNOSIS — I10 Essential (primary) hypertension: Secondary | ICD-10-CM

## 2024-05-13 DIAGNOSIS — G4733 Obstructive sleep apnea (adult) (pediatric): Secondary | ICD-10-CM

## 2024-05-13 DIAGNOSIS — F17200 Nicotine dependence, unspecified, uncomplicated: Secondary | ICD-10-CM

## 2024-05-13 DIAGNOSIS — J432 Centrilobular emphysema: Secondary | ICD-10-CM

## 2024-05-13 DIAGNOSIS — I5032 Chronic diastolic (congestive) heart failure: Secondary | ICD-10-CM

## 2024-05-20 ENCOUNTER — Ambulatory Visit: Admission: RE | Admit: 2024-05-20 | Source: Home / Self Care | Admitting: Gastroenterology

## 2024-05-20 ENCOUNTER — Encounter: Admission: RE | Payer: Self-pay | Source: Home / Self Care

## 2024-05-21 ENCOUNTER — Ambulatory Visit: Admitting: Student

## 2024-05-27 ENCOUNTER — Ambulatory Visit: Admitting: Student
